# Patient Record
Sex: Male | Born: 1960 | Race: White | Hispanic: No | Marital: Single | Smoking: Never smoker
Health system: Southern US, Community
[De-identification: ages and names within clinical notes are randomized; demographics above are authoritative.]

## PROBLEM LIST (undated history)

## (undated) DIAGNOSIS — E119 Type 2 diabetes mellitus without complications: Secondary | ICD-10-CM

## (undated) DIAGNOSIS — E785 Hyperlipidemia, unspecified: Secondary | ICD-10-CM

## (undated) DIAGNOSIS — I10 Essential (primary) hypertension: Secondary | ICD-10-CM

## (undated) HISTORY — PX: TONSILLECTOMY AND ADENOIDECTOMY: SUR1326

## (undated) HISTORY — DX: Type 2 diabetes mellitus without complications: E11.9

## (undated) HISTORY — DX: Essential (primary) hypertension: I10

## (undated) HISTORY — DX: Hyperlipidemia, unspecified: E78.5

## (undated) HISTORY — PX: CATARACT EXTRACTION: SUR2

## (undated) HISTORY — PX: HERNIA REPAIR: SHX51

---

## 2015-03-07 LAB — HM DIABETES EYE EXAM

## 2015-03-30 ENCOUNTER — Ambulatory Visit (INDEPENDENT_AMBULATORY_CARE_PROVIDER_SITE_OTHER): Payer: Self-pay | Admitting: Family Medicine

## 2015-03-30 ENCOUNTER — Encounter: Payer: Self-pay | Admitting: Family Medicine

## 2015-03-30 VITALS — BP 112/59 | HR 59 | Temp 98.8°F | Ht 69.5 in | Wt 175.0 lb

## 2015-03-30 DIAGNOSIS — E1151 Type 2 diabetes mellitus with diabetic peripheral angiopathy without gangrene: Secondary | ICD-10-CM | POA: Insufficient documentation

## 2015-03-30 DIAGNOSIS — F419 Anxiety disorder, unspecified: Secondary | ICD-10-CM | POA: Insufficient documentation

## 2015-03-30 DIAGNOSIS — D509 Iron deficiency anemia, unspecified: Secondary | ICD-10-CM

## 2015-03-30 DIAGNOSIS — E785 Hyperlipidemia, unspecified: Secondary | ICD-10-CM | POA: Insufficient documentation

## 2015-03-30 DIAGNOSIS — E119 Type 2 diabetes mellitus without complications: Secondary | ICD-10-CM

## 2015-03-30 DIAGNOSIS — I10 Essential (primary) hypertension: Secondary | ICD-10-CM | POA: Insufficient documentation

## 2015-03-30 LAB — BAYER DCA HB A1C WAIVED: HB A1C (BAYER DCA - WAIVED): 6.3 % (ref <7.0)

## 2015-03-30 MED ORDER — BENAZEPRIL HCL 40 MG PO TABS: 40.0000 mg | ORAL_TABLET | Freq: Every day | ORAL | Status: DC

## 2015-03-30 MED ORDER — SIMVASTATIN 40 MG PO TABS: 40.0000 mg | ORAL_TABLET | Freq: Every day | ORAL | Status: DC

## 2015-03-30 MED ORDER — METFORMIN HCL 500 MG PO TABS: 1000.0000 mg | ORAL_TABLET | Freq: Two times a day (BID) | ORAL | Status: DC

## 2015-03-30 MED ORDER — GLYBURIDE 5 MG PO TABS: 5.0000 mg | ORAL_TABLET | Freq: Every day | ORAL | Status: DC

## 2015-03-30 NOTE — Assessment & Plan Note (Signed)
The current medical regimen is effective;  continue present plan and medications.  

## 2015-03-30 NOTE — Progress Notes (Signed)
BP 112/59 mmHg  Pulse 59  Temp(Src) 98.8 F (37.1 C)  Ht 5' 9.5" (1.765 m)  Wt 175 lb (79.379 kg)  BMI 25.48 kg/m2  SpO2 99%   Subjective:    Patient ID: Stanley Dickerson, male    DOB: 12-01-1960, 54 y.o.   MRN: [id number]  HPI: Stanley Dickerson is a 54 y.o. male  Chief Complaint  Patient presents with  . Diabetes  . Allergic Rhinitis   . Urticaria  . Insect Bite    multiple tick bites  meds doing well glu stable No low spells  BP and lipids stable  Allergies has not tried any meds Had hives took benadril helped Had a lot of stress  Tick bites no rash  Etc no flu sx  Relevant past medical, surgical, family and social history reviewed and updated as indicated. Interim medical history since our last visit reviewed. Allergies and medications reviewed and updated.  Review of Systems  Constitutional: Negative.   Respiratory: Negative.   Cardiovascular: Negative.     Per HPI unless specifically indicated above     Objective:    BP 112/59 mmHg  Pulse 59  Temp(Src) 98.8 F (37.1 C)  Ht 5' 9.5" (1.765 m)  Wt 175 lb (79.379 kg)  BMI 25.48 kg/m2  SpO2 99%  Wt Readings from Last 3 Encounters:  03/30/15 175 lb (79.379 kg)  12/01/14 178 lb (80.74 kg)    Physical Exam  Constitutional: He is oriented to person, place, and time. He appears well-developed and well-nourished. No distress.  HENT:  Head: Normocephalic and atraumatic.  Right Ear: Hearing normal.  Left Ear: Hearing normal.  Nose: Nose normal.  Eyes: Conjunctivae and lids are normal. Right eye exhibits no discharge. Left eye exhibits no discharge. No scleral icterus.  Cardiovascular: Normal rate, regular rhythm and normal heart sounds.   Pulmonary/Chest: Effort normal and breath sounds normal. No respiratory distress.  Musculoskeletal: Normal range of motion.  Neurological: He is alert and oriented to person, place, and time.  Skin: Skin is intact. No rash noted.  Psychiatric: He has a normal mood  and affect. His speech is normal and behavior is normal. Judgment and thought content normal. Cognition and memory are normal.    No results found for this or any previous visit.    Assessment & Plan:   Problem List Items Addressed This Visit      Cardiovascular and Mediastinum   Hypertension (Chronic)    The current medical regimen is effective;  continue present plan and medications.       Relevant Medications   benazepril (LOTENSIN) 40 MG tablet   simvastatin (ZOCOR) 40 MG tablet     Endocrine   Diabetes mellitus without complication - Primary (Chronic)    The current medical regimen is effective;  continue present plan and medications.       Relevant Medications   benazepril (LOTENSIN) 40 MG tablet   glyBURIDE (DIABETA) 5 MG tablet   metFORMIN (GLUCOPHAGE) 500 MG tablet   simvastatin (ZOCOR) 40 MG tablet   Other Relevant Orders   Bayer DCA Hb A1c Waived   Hemoglobin A1c     Other   Hyperlipidemia (Chronic)    The current medical regimen is effective;  continue present plan and medications.       Relevant Medications   benazepril (LOTENSIN) 40 MG tablet   simvastatin (ZOCOR) 40 MG tablet   Anxiety (Chronic)    Other Visit Diagnoses    Anemia, iron deficiency  Relevant Orders    CBC with Differential/Platelet        Follow up plan: Return in about 3 months (around 06/30/2015) for a1c.

## 2015-04-09 ENCOUNTER — Emergency Department: Payer: Self-pay

## 2015-04-09 ENCOUNTER — Other Ambulatory Visit: Payer: Self-pay

## 2015-04-09 ENCOUNTER — Emergency Department
Admission: EM | Admit: 2015-04-09 | Discharge: 2015-04-09 | Disposition: A | Discharge status: Home / Self Care | Payer: Self-pay | Attending: Emergency Medicine | Admitting: Emergency Medicine

## 2015-04-09 ENCOUNTER — Encounter: Payer: Self-pay | Admitting: Emergency Medicine

## 2015-04-09 DIAGNOSIS — E119 Type 2 diabetes mellitus without complications: Secondary | ICD-10-CM | POA: Insufficient documentation

## 2015-04-09 DIAGNOSIS — Z79899 Other long term (current) drug therapy: Secondary | ICD-10-CM | POA: Insufficient documentation

## 2015-04-09 DIAGNOSIS — Y9389 Activity, other specified: Secondary | ICD-10-CM | POA: Insufficient documentation

## 2015-04-09 DIAGNOSIS — J069 Acute upper respiratory infection, unspecified: Secondary | ICD-10-CM | POA: Insufficient documentation

## 2015-04-09 DIAGNOSIS — W57XXXA Bitten or stung by nonvenomous insect and other nonvenomous arthropods, initial encounter: Secondary | ICD-10-CM | POA: Insufficient documentation

## 2015-04-09 DIAGNOSIS — Y998 Other external cause status: Secondary | ICD-10-CM | POA: Insufficient documentation

## 2015-04-09 DIAGNOSIS — I10 Essential (primary) hypertension: Secondary | ICD-10-CM | POA: Insufficient documentation

## 2015-04-09 DIAGNOSIS — Y9289 Other specified places as the place of occurrence of the external cause: Secondary | ICD-10-CM | POA: Insufficient documentation

## 2015-04-09 DIAGNOSIS — R509 Fever, unspecified: Secondary | ICD-10-CM

## 2015-04-09 DIAGNOSIS — R5081 Fever presenting with conditions classified elsewhere: Secondary | ICD-10-CM | POA: Insufficient documentation

## 2015-04-09 DIAGNOSIS — T148 Other injury of unspecified body region: Secondary | ICD-10-CM | POA: Insufficient documentation

## 2015-04-09 LAB — CBC WITH DIFFERENTIAL/PLATELET
BASOS PCT: 2 %
Basophils Absolute: 0 10*3/uL (ref 0–0.1)
EOS PCT: 0 %
Eosinophils Absolute: 0 10*3/uL (ref 0–0.7)
HEMATOCRIT: 34.4 % — AB (ref 40.0–52.0)
Hemoglobin: 11.3 g/dL — ABNORMAL LOW (ref 13.0–18.0)
LYMPHS PCT: 17 %
Lymphs Abs: 0.4 10*3/uL — ABNORMAL LOW (ref 1.0–3.6)
MCH: 26.6 pg (ref 26.0–34.0)
MCHC: 32.8 g/dL (ref 32.0–36.0)
MCV: 81.1 fL (ref 80.0–100.0)
MONO ABS: 0.2 10*3/uL (ref 0.2–1.0)
MONOS PCT: 8 %
Neutro Abs: 1.9 10*3/uL (ref 1.4–6.5)
Neutrophils Relative %: 73 %
Platelets: 151 10*3/uL (ref 150–440)
RBC: 4.24 MIL/uL — AB (ref 4.40–5.90)
RDW: 16.6 % — AB (ref 11.5–14.5)
WBC: 2.6 10*3/uL — AB (ref 3.8–10.6)

## 2015-04-09 LAB — COMPREHENSIVE METABOLIC PANEL
ALT: 35 U/L (ref 17–63)
AST: 39 U/L (ref 15–41)
Albumin: 4.1 g/dL (ref 3.5–5.0)
Alkaline Phosphatase: 45 U/L (ref 38–126)
Anion gap: 10 (ref 5–15)
BILIRUBIN TOTAL: 0.7 mg/dL (ref 0.3–1.2)
BUN: 26 mg/dL — AB (ref 6–20)
CO2: 26 mmol/L (ref 22–32)
CREATININE: 1.12 mg/dL (ref 0.61–1.24)
Calcium: 9.3 mg/dL (ref 8.9–10.3)
Chloride: 95 mmol/L — ABNORMAL LOW (ref 101–111)
GFR calc Af Amer: >60 mL/min (ref >60)
GLUCOSE: 153 mg/dL — AB (ref 65–99)
Potassium: 3.9 mmol/L (ref 3.5–5.1)
SODIUM: 131 mmol/L — AB (ref 135–145)
TOTAL PROTEIN: 7.7 g/dL (ref 6.5–8.1)

## 2015-04-09 LAB — TROPONIN I: Troponin I: <0.03 ng/mL (ref <0.031)

## 2015-04-09 MED ORDER — DOXYCYCLINE HYCLATE 50 MG PO CAPS: 100.0000 mg | ORAL_CAPSULE | Freq: Two times a day (BID) | ORAL | Status: AC

## 2015-04-09 MED ORDER — DOXYCYCLINE HYCLATE 100 MG PO TABS
ORAL_TABLET | ORAL | Status: AC
  Administered 2015-04-09: 100 mg via ORAL
  Filled 2015-04-09: qty 1

## 2015-04-09 MED ORDER — SODIUM CHLORIDE 0.9 % IV BOLUS (SEPSIS)
1000.0000 mL | Freq: Once | INTRAVENOUS | Status: AC
  Administered 2015-04-09: 1000 mL via INTRAVENOUS

## 2015-04-09 MED ORDER — DOXYCYCLINE HYCLATE 100 MG PO TABS
100.0000 mg | ORAL_TABLET | Freq: Once | ORAL | Status: AC
  Administered 2015-04-09: 100 mg via ORAL

## 2015-04-09 MED ORDER — KETOROLAC TROMETHAMINE 30 MG/ML IJ SOLN
INTRAMUSCULAR | Status: AC
  Administered 2015-04-09: 30 mg via INTRAVENOUS
  Filled 2015-04-09: qty 1

## 2015-04-09 MED ORDER — KETOROLAC TROMETHAMINE 30 MG/ML IJ SOLN
30.0000 mg | Freq: Once | INTRAMUSCULAR | Status: AC
  Administered 2015-04-09: 30 mg via INTRAVENOUS

## 2015-04-09 NOTE — ED Provider Notes (Signed)
[facility] Emergency Department Provider Note  ____________________________________________  Time seen: 1502  I have reviewed the triage vital signs and the nursing notes.   HISTORY  Chief Complaint Fever and Cough Diarrhea Fatigue   HPI Stanley Dickerson is a 54 y.o. male with diabetes who reports that he has been having fevers since Tuesday or Wednesday with a cough. He has had some nasal congestion as well.  His fever on Thursday reached 102.5. Tylenol with relief. The fever came back today 201.5.  He reports he has been feeling more fatigued recently. 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  He started have some loose stool yesterday. Today he began to have watery diarrhea.  While he was driving today he had some difficulty recognizing the colors of the street light. He felt a little weak. He parked his car and had his girlfriend drive him to the doctor's office next door, [place] [facility]. They were concerned because of the vision issues and sent him to the emergency department.  Patient does report that he has "sinus infections" 3 or 4 times per year.  Patient reports he has had multiple tick bites in the past few weeks. He discussed this with his primary physician, Dr. Dossie Arbour, but they agreed that antibiotics were not indicated.    Past Medical History  Diagnosis Date  . Anxiety   . Hypertension   . Diabetes mellitus without complication   . Hyperlipidemia     Patient Active Problem List   Diagnosis Date Noted  . Hypertension 03/30/2015  . Diabetes mellitus without complication 03/30/2015  . Hyperlipidemia 03/30/2015  . Anxiety 03/30/2015    Past Surgical History  Procedure Laterality Date  . Hernia repair    . Tonsillectomy and adenoidectomy      Current Outpatient Rx  Name  Route  Sig  Dispense  Refill  . aspirin  EC 81 MG tablet   Oral   Take 81 mg by mouth daily.         . benazepril (LOTENSIN) 40 MG tablet   Oral   Take 1 tablet (40 mg total) by mouth daily.   90 tablet   4   . doxycycline (VIBRAMYCIN) 50 MG capsule   Oral   Take 2 capsules (100 mg total) by mouth 2 (two) times daily.   40 capsule   0   . glyBURIDE (DIABETA) 5 MG tablet   Oral   Take 1 tablet (5 mg total) by mouth daily with breakfast.   90 tablet   4   . metFORMIN (GLUCOPHAGE) 500 MG tablet   Oral   Take 2 tablets (1,000 mg total) by mouth 2 (two) times daily with a meal.   90 tablet   4   . simvastatin (ZOCOR) 40 MG tablet   Oral   Take 1 tablet (40 mg total) by mouth daily.   90 tablet   4     Allergies Review of patient's allergies indicates no known allergies.  Family History  Problem Relation Age of Onset  . Depression Mother   . Thyroid disease Mother   . Alzheimer's disease Mother   . Alcohol abuse Father   . Cancer Father     throat and lung  . Alzheimer's disease Maternal Grandmother     Social History History  Substance Use Topics  . Smoking status: Never  Smoker   . Smokeless tobacco: Never Used  . Alcohol Use: No    Review of Systems  Constitutional: Positive for fever. ENT: Positive for sore throat and congestion.. Cardiovascular: Negative for chest pain. Respiratory: Positive for cough. Gastrointestinal: No abdominal pain but watery diarrhea today.. Genitourinary: Negative for dysuria. Musculoskeletal: No myalgias or injuries. Skin: Negative for rash. There are some scabbed over areas from 2 bites. Neurological: Negative for headaches   10-point ROS otherwise negative.  ____________________________________________   PHYSICAL EXAM:  VITAL SIGNS: ED Triage Vitals  Enc Vitals Group     BP 04/09/15 1323 95/66 mmHg     Pulse Rate 04/09/15 1323 99     Resp 04/09/15 1323 18     Temp 04/09/15 1323 100.1 F (37.8 C)     Temp Source 04/09/15 1323 Oral     SpO2  04/09/15 1323 98 %     Weight 04/09/15 1323 168 lb (76.204 kg)     Height 04/09/15 1323 5\' 11"  (1.803 m)     Head Cir --      Peak Flow --      Pain Score 04/09/15 1324 4     Pain Loc --      Pain Edu? --      Excl. in GC? --     Constitutional: Alert and oriented. Well appearing and in no distress. ENT   Head: Normocephalic and atraumatic.   Nose: No congestion/rhinnorhea.   Mouth/Throat: Mucous membranes are moist. Cardiovascular: Normal rate, regular rhythm, no murmur noted Respiratory:  Normal respiratory effort, no tachypnea.    Breath sounds are clear and equal bilaterally.  Gastrointestinal: Soft and nontender. No distention.  Back: No muscle spasm, no tenderness, no CVA tenderness. Musculoskeletal: No deformity noted. Nontender with normal range of motion in all extremities.  No noted edema. Neurologic:  Normal speech and language. No gross focal neurologic deficits are appreciated.  Skin:  Skin is warm, dry. Tick bites appear well-healed without any erythema. No rash. Psychiatric: Mood and affect are normal. Speech and behavior are normal.  ____________________________________________    LABS (pertinent positives/negatives)  CBC shows a low white blood cell count of 2.6. Hemoglobin 11.3. Sodium 131, BUN 26, glucose 153, otherwise metabolic panel is fairly reasonable.  Troponin is negative  ____________________________________________   EKG  ED ECG REPORT I, Vivia Rosenburg W, the attending physician, personally viewed and interpreted this ECG.   Date: 04/09/2015  EKG Time: 1529  Rate: 90  Rhythm: Normal sinus rhythm with possible left ventricular hypertrophy  Axis: Normal  Intervals: Normal  ST&T Change: T wave appears flat down in lead 3. There are some color T waves in the precordial leads.  ____________________________________________    RADIOLOGY  Chest x-ray: IMPRESSION: No active cardiopulmonary  disease.  ____________________________________________  ____________________________________________   INITIAL IMPRESSION / ASSESSMENT AND PLAN / ED COURSE  Pertinent labs & imaging results that were available during my care of the patient were reviewed by me and considered in my medical decision making (see chart for details).  Patient with multiple symptoms including cough fever recent congestion diarrhea today and concerns for tick bite. We have discussed at length the diagnosis of sinusitis versus rhinitis versus bronchitis. We've discussed his risk for a tickborne illness. We have agreed to use doxycycline. We are treating him with a liter of IV fluid. We've noted his sodium was little bit low and his BUN was slightly high.  Reassessment at 1700: Patient feels well. He does have a  fever that has come back up. We'll treat him with Toradol and let the risks of the IV fluids going in. We will write a perception for doxycycline. The low white blood cell count may be due to tickborne illness.  ____________________________________________   FINAL CLINICAL IMPRESSION(S) / ED DIAGNOSES  Final diagnoses:  Other specified fever  Upper respiratory tract infection  Tick bite      Darien Ramus, MD 04/09/15 1721

## 2015-04-09 NOTE — Discharge Instructions (Signed)
Take doxycycline for 10 days. Follow-up with your regular doctor later this week. Return to the emergency department if you have worsening symptoms or urgent concerns.

## 2015-04-09 NOTE — ED Notes (Addendum)
C/o cough, sore throat, congestion and fever x 1 week, states he vomited x 1 today and while driving he had some blurred vision

## 2015-07-05 ENCOUNTER — Ambulatory Visit: Payer: Self-pay | Admitting: Family Medicine

## 2015-08-08 ENCOUNTER — Telehealth: Payer: Self-pay

## 2015-08-08 DIAGNOSIS — E119 Type 2 diabetes mellitus without complications: Secondary | ICD-10-CM

## 2015-08-08 NOTE — Telephone Encounter (Signed)
Pharmacy requests a resend on metformin 500 mg tab. Pharmacy noted that the Rx is not a 30 day supply dispensed 90.   [facility] [address] [place]  [phone]

## 2015-08-09 MED ORDER — METFORMIN HCL 500 MG PO TABS: 1000.0000 mg | ORAL_TABLET | Freq: Two times a day (BID) | ORAL | Status: DC

## 2015-09-05 ENCOUNTER — Encounter: Payer: Self-pay | Admitting: Family Medicine

## 2015-10-20 ENCOUNTER — Ambulatory Visit (INDEPENDENT_AMBULATORY_CARE_PROVIDER_SITE_OTHER): Payer: Self-pay | Admitting: Family Medicine

## 2015-10-20 ENCOUNTER — Encounter: Payer: Self-pay | Admitting: Family Medicine

## 2015-10-20 VITALS — BP 123/75 | HR 76 | Temp 98.0°F | Ht 68.7 in | Wt 176.0 lb

## 2015-10-20 DIAGNOSIS — I10 Essential (primary) hypertension: Secondary | ICD-10-CM

## 2015-10-20 DIAGNOSIS — R42 Dizziness and giddiness: Secondary | ICD-10-CM

## 2015-10-20 DIAGNOSIS — E119 Type 2 diabetes mellitus without complications: Secondary | ICD-10-CM

## 2015-10-20 LAB — BAYER DCA HB A1C WAIVED: HB A1C: 6.6 % (ref <7.0)

## 2015-10-20 NOTE — Assessment & Plan Note (Signed)
Patient's diabetes has been doing well has not been checking home glucose discussed to check his blood sugar if he can during one of these spells are right after. That is if he has any more

## 2015-10-20 NOTE — Assessment & Plan Note (Signed)
The current medical regimen is effective;  continue present plan and medications.  

## 2015-10-20 NOTE — Assessment & Plan Note (Signed)
Discussed vertigo care and treatment cautions about falling and driving patient has further spells will need to evaluate patient with no positional changes

## 2015-10-20 NOTE — Progress Notes (Signed)
BP 123/75 mmHg  Pulse 76  Temp(Src) 98 F (36.7 C)  Ht 5' 8.7" (1.745 m)  Wt 176 lb (79.833 kg)  BMI 26.22 kg/m2  SpO2 99%   Subjective:    Patient ID: Stanley Dickerson, male    DOB: 08/24/1961, 55 y.o.   MRN: [id number]  HPI: Stanley Dickerson is a 55 y.o. male  Chief Complaint  Patient presents with  . Diabetes  . sinus problems  . Nausea   patient's diabetes doing well no complaints noted low blood sugar spells taking medications faithfully without problems  Patient did have a terrible vertigo spell last week with taking garbage to the dumpster got so dizzy that he could not stand up and had to crawl back into the office building. The spell lasted about 15 minutes then completely resolved. Patient noticed no palpitations and no diaphoresis no other low blood sugar spells but he did not check his blood sugar. Patient with no chest pain with exertion and had no nausea or vomiting.  Relevant past medical, surgical, family and social history reviewed and updated as indicated. Interim medical history since our last visit reviewed. Allergies and medications reviewed and updated.  Review of Systems  Constitutional: Negative.   Respiratory: Negative.   Cardiovascular: Negative.     Per HPI unless specifically indicated above     Objective:    BP 123/75 mmHg  Pulse 76  Temp(Src) 98 F (36.7 C)  Ht 5' 8.7" (1.745 m)  Wt 176 lb (79.833 kg)  BMI 26.22 kg/m2  SpO2 99%  Wt Readings from Last 3 Encounters:  10/20/15 176 lb (79.833 kg)  04/09/15 168 lb (76.204 kg)  03/30/15 175 lb (79.379 kg)    Physical Exam  Constitutional: He is oriented to person, place, and time. He appears well-developed and well-nourished. No distress.  HENT:  Head: Normocephalic and atraumatic.  Right Ear: Hearing normal.  Left Ear: Hearing and external ear normal.  Nose: Nose normal.  Mouth/Throat: Oropharynx is clear and moist. No oropharyngeal exudate.  Eyes: Conjunctivae, EOM and lids are  normal. Right eye exhibits no discharge. Left eye exhibits no discharge. No scleral icterus.  Neck: Normal range of motion. Neck supple. No thyromegaly present.  Cardiovascular: Normal rate, regular rhythm and normal heart sounds.   Pulmonary/Chest: Effort normal and breath sounds normal. No respiratory distress.  Abdominal: He exhibits no distension. There is no tenderness.  Musculoskeletal: Normal range of motion.  Lymphadenopathy:    He has no cervical adenopathy.  Neurological: He is alert and oriented to person, place, and time.  Skin: Skin is intact. No rash noted.  Psychiatric: He has a normal mood and affect. His speech is normal and behavior is normal. Judgment and thought content normal. Cognition and memory are normal.    Results for orders placed or performed during the hospital encounter of 04/09/15  CBC with Differential  Result Value Ref Range   WBC 2.6 (L) 3.8 - 10.6 K/uL   RBC 4.24 (L) 4.40 - 5.90 MIL/uL   Hemoglobin 11.3 (L) 13.0 - 18.0 g/dL   HCT 13.2 (L) 44.0 - 10.2 %   MCV 81.1 80.0 - 100.0 fL   MCH 26.6 26.0 - 34.0 pg   MCHC 32.8 32.0 - 36.0 g/dL   RDW 72.5 (H) 36.6 - 44.0 %   Platelets 151 150 - 440 K/uL   Neutrophils Relative % 73 %   Neutro Abs 1.9 1.4 - 6.5 K/uL   Lymphocytes Relative 17 %  Lymphs Abs 0.4 (L) 1.0 - 3.6 K/uL   Monocytes Relative 8 %   Monocytes Absolute 0.2 0.2 - 1.0 K/uL   Eosinophils Relative 0 %   Eosinophils Absolute 0.0 0 - 0.7 K/uL   Basophils Relative 2 %   Basophils Absolute 0.0 0 - 0.1 K/uL  Comprehensive metabolic panel  Result Value Ref Range   Sodium 131 (L) 135 - 145 mmol/L   Potassium 3.9 3.5 - 5.1 mmol/L   Chloride 95 (L) 101 - 111 mmol/L   CO2 26 22 - 32 mmol/L   Glucose, Bld 153 (H) 65 - 99 mg/dL   BUN 26 (H) 6 - 20 mg/dL   Creatinine, Ser 4.781.12 0.61 - 1.24 mg/dL   Calcium 9.3 8.9 - 29.510.3 mg/dL   Total Protein 7.7 6.5 - 8.1 g/dL   Albumin 4.1 3.5 - 5.0 g/dL   AST 39 15 - 41 U/L   ALT 35 17 - 63 U/L   Alkaline  Phosphatase 45 38 - 126 U/L   Total Bilirubin 0.7 0.3 - 1.2 mg/dL   GFR calc non Af Amer >60 >60 mL/min   GFR calc Af Amer >60 >60 mL/min   Anion gap 10 5 - 15  Troponin I  Result Value Ref Range   Troponin I <0.03 <0.031 ng/mL      Assessment & Plan:   Problem List Items Addressed This Visit      Cardiovascular and Mediastinum   Hypertension (Chronic)    The current medical regimen is effective;  continue present plan and medications.         Endocrine   Diabetes mellitus without complication (HCC) - Primary (Chronic)    Patient's diabetes has been doing well has not been checking home glucose discussed to check his blood sugar if he can during one of these spells are right after. That is if he has any more      Relevant Orders   Bayer DCA Hb A1c Waived   Flu Vaccine QUAD 36+ mos PF IM (Fluarix & Fluzone Quad PF)     Other   Vertigo    Discussed vertigo care and treatment cautions about falling and driving patient has further spells will need to evaluate patient with no positional changes      Relevant Orders   EKG 12-Lead (Completed)     reviewed EKG normal sinus rhythm no palpitations question of LVH  Follow up plan: Return in about 3 months (around 01/18/2016), or if symptoms worsen or fail to improve, for Physical Exam.

## 2015-12-20 ENCOUNTER — Encounter: Payer: Self-pay | Admitting: Family Medicine

## 2016-01-31 ENCOUNTER — Encounter: Payer: Self-pay | Admitting: Family Medicine

## 2016-01-31 ENCOUNTER — Ambulatory Visit (INDEPENDENT_AMBULATORY_CARE_PROVIDER_SITE_OTHER): Payer: Self-pay | Admitting: Family Medicine

## 2016-01-31 VITALS — BP 117/59 | HR 76 | Temp 98.1°F | Ht 68.2 in | Wt 174.0 lb

## 2016-01-31 DIAGNOSIS — E785 Hyperlipidemia, unspecified: Secondary | ICD-10-CM

## 2016-01-31 DIAGNOSIS — E119 Type 2 diabetes mellitus without complications: Secondary | ICD-10-CM

## 2016-01-31 DIAGNOSIS — I10 Essential (primary) hypertension: Secondary | ICD-10-CM

## 2016-01-31 DIAGNOSIS — Z1211 Encounter for screening for malignant neoplasm of colon: Secondary | ICD-10-CM

## 2016-01-31 LAB — BAYER DCA HB A1C WAIVED: HB A1C: 7 % — AB (ref <7.0)

## 2016-01-31 MED ORDER — GLYBURIDE 5 MG PO TABS: 5.0000 mg | ORAL_TABLET | Freq: Every day | ORAL | Status: DC

## 2016-01-31 MED ORDER — BENAZEPRIL HCL 40 MG PO TABS: 40.0000 mg | ORAL_TABLET | Freq: Every day | ORAL | Status: DC

## 2016-01-31 MED ORDER — METFORMIN HCL 500 MG PO TABS: 1000.0000 mg | ORAL_TABLET | Freq: Two times a day (BID) | ORAL | Status: DC

## 2016-01-31 MED ORDER — SIMVASTATIN 40 MG PO TABS: 40.0000 mg | ORAL_TABLET | Freq: Every day | ORAL | Status: DC

## 2016-01-31 NOTE — Progress Notes (Signed)
BP 117/59 mmHg  Pulse 76  Temp(Src) 98.1 F (36.7 C)  Ht 5' 8.2" (1.732 m)  Wt 174 lb (78.926 kg)  BMI 26.31 kg/m2  SpO2 97%   Subjective:    Patient ID: Stanley Dickerson, male    DOB: [birth year], 55 y.o.   MRN: [id number]  HPI: Stanley Dickerson is a 55 y.o. male  Chief Complaint  Patient presents with  . Annual Exam  Patient for medical exam doing well taking Benzapril with no issues good blood pressure control. Diabetes noted low blood sugar spells no issues with medicines takes faithfully Takes simvastatin with no issues takes faithfully Has some concerns about family history and Alzheimer's reviewed  Relevant past medical, surgical, family and social history reviewed and updated as indicated. Interim medical history since our last visit reviewed. Allergies and medications reviewed and updated.  Review of Systems  Constitutional: Negative.   HENT: Negative.   Eyes: Negative.   Respiratory: Negative.   Cardiovascular: Negative.   Gastrointestinal: Negative.   Endocrine: Negative.   Genitourinary: Negative.   Musculoskeletal: Negative.   Skin: Negative.   Allergic/Immunologic: Negative.   Neurological: Negative.   Hematological: Negative.   Psychiatric/Behavioral: Negative.     Per HPI unless specifically indicated above     Objective:    BP 117/59 mmHg  Pulse 76  Temp(Src) 98.1 F (36.7 C)  Ht 5' 8.2" (1.732 m)  Wt 174 lb (78.926 kg)  BMI 26.31 kg/m2  SpO2 97%  Wt Readings from Last 3 Encounters:  01/31/16 174 lb (78.926 kg)  10/20/15 176 lb (79.833 kg)  04/09/15 168 lb (76.204 kg)    Physical Exam  Constitutional: He is oriented to person, place, and time. He appears well-developed and well-nourished.  HENT:  Head: Normocephalic and atraumatic.  Right Ear: External ear normal.  Left Ear: External ear normal.  Eyes: Conjunctivae and EOM are normal. Pupils are equal, round, and reactive to light.  Neck: Normal range of motion. Neck supple.   Cardiovascular: Normal rate, regular rhythm, normal heart sounds and intact distal pulses.   Pulmonary/Chest: Effort normal and breath sounds normal.  Abdominal: Soft. Bowel sounds are normal. There is no splenomegaly or hepatomegaly.  Genitourinary: Rectum normal, prostate normal and penis normal.  Musculoskeletal: Normal range of motion.  Neurological: He is alert and oriented to person, place, and time. He has normal reflexes.  Skin: No rash noted. No erythema.  Psychiatric: He has a normal mood and affect. His behavior is normal. Judgment and thought content normal.    Results for orders placed or performed in visit on 10/20/15  Bayer DCA Hb A1c Waived  Result Value Ref Range   Bayer DCA Hb A1c Waived 6.6 <7.0 %      Assessment & Plan:   Problem List Items Addressed This Visit      Cardiovascular and Mediastinum   Hypertension (Chronic)    The current medical regimen is effective;  continue present plan and medications.       Relevant Medications   benazepril (LOTENSIN) 40 MG tablet   simvastatin (ZOCOR) 40 MG tablet   Other Relevant Orders   Comprehensive metabolic panel   Lipid panel   CBC with Differential/Platelet   TSH   Urinalysis, Routine w reflex microscopic (not at [facility])   PSA   Bayer DCA Hb A1c Waived     Endocrine   Diabetes mellitus without complication (HCC) (Chronic)   Relevant Medications   benazepril (LOTENSIN) 40 MG tablet  glyBURIDE (DIABETA) 5 MG tablet   metFORMIN (GLUCOPHAGE) 500 MG tablet   simvastatin (ZOCOR) 40 MG tablet   Other Relevant Orders   Comprehensive metabolic panel   Lipid panel   CBC with Differential/Platelet   TSH   Urinalysis, Routine w reflex microscopic ([facility])   PSA   Bayer DCA Hb A1c Waived     Other   Hyperlipidemia (Chronic)    The current medical regimen is effective;  continue present plan and medications.       Relevant Medications   benazepril (LOTENSIN) 40 MG tablet   simvastatin (ZOCOR) 40 MG  tablet   Other Relevant Orders   Comprehensive metabolic panel   Lipid panel   CBC with Differential/Platelet   TSH   Urinalysis, Routine w reflex microscopic [facility])   PSA   Bayer DCA Hb A1c Waived    Other Visit Diagnoses    Colon cancer screening    -  Primary    Relevant Orders    Ambulatory referral to General Surgery    Comprehensive metabolic panel    Lipid panel    CBC with Differential/Platelet    TSH    Urinalysis, Routine w reflex microscopic ([facility])    PSA    Bayer DCA Hb A1c Waived        Follow up plan: Return in about 3 months (around 05/01/2016).

## 2016-01-31 NOTE — Assessment & Plan Note (Signed)
The current medical regimen is effective;  continue present plan and medications.  

## 2016-04-02 LAB — HM DIABETES EYE EXAM

## 2016-04-30 ENCOUNTER — Other Ambulatory Visit: Payer: Self-pay

## 2016-04-30 ENCOUNTER — Telehealth: Payer: Self-pay

## 2016-04-30 NOTE — Telephone Encounter (Signed)
Gastroenterology Pre-Procedure Review  Request Date: 06/08/2016  Requesting Physician: Dr. Dossie Arbourrissman  PATIENT REVIEW QUESTIONS: The patient responded to the following health history questions as indicated:    1. Are you having any GI issues? no 2. Do you have a personal history of Polyps? yes (benign) 3. Do you have a family history of Colon Cancer or Polyps? no 4. Diabetes Mellitus? yes (Type 2) 5. Joint replacements in the past 12 months?no 6. Major health problems in the past 3 months?no 7. Any artificial heart valves, MVP, or defibrillator?no    MEDICATIONS & ALLERGIES:    Patient reports the following regarding taking any anticoagulation/antiplatelet therapy:   Plavix, Coumadin, Eliquis, Xarelto, Lovenox, Pradaxa, Brilinta, or Effient? no Aspirin? yes (Diabetis )  Patient confirms/reports the following medications:  Current Outpatient Prescriptions  Medication Sig Dispense Refill  . aspirin EC 81 MG tablet Take 81 mg by mouth daily.    . benazepril (LOTENSIN) 40 MG tablet Take 1 tablet (40 mg total) by mouth daily. 90 tablet 4  . glyBURIDE (DIABETA) 5 MG tablet Take 1 tablet (5 mg total) by mouth daily with breakfast. 90 tablet 4  . metFORMIN (GLUCOPHAGE) 500 MG tablet Take 2 tablets (1,000 mg total) by mouth 2 (two) times daily with a meal. 360 tablet 4  . simvastatin (ZOCOR) 40 MG tablet Take 1 tablet (40 mg total) by mouth daily. 90 tablet 4  . B-12, METHYLCOBALAMIN, SL Place under the tongue 2 (two) times a week. Reported on 04/30/2016     No current facility-administered medications for this visit.    Patient confirms/reports the following allergies:  No Known Allergies  No orders of the defined types were placed in this encounter.    AUTHORIZATION INFORMATION Primary Insurance: 1D#: Group #:  Secondary Insurance: 1D#: Group #:  SCHEDULE INFORMATION: Date: 06/08/2016 Time: Location: [facility]

## 2016-05-15 ENCOUNTER — Ambulatory Visit: Payer: Self-pay | Admitting: Family Medicine

## 2016-06-04 ENCOUNTER — Telehealth: Payer: Self-pay

## 2016-06-04 NOTE — Telephone Encounter (Signed)
Patient called stating that he needed to reschedule his Colonoscopy scheduled on 06/08/2016. Patient also stated that he was suppose to receive a call with how much it would cost him to have this procedure done since he is a self-pay patient.

## 2016-06-05 NOTE — Telephone Encounter (Signed)
I'm keeping his appointment on the books until the hospital calls for his post op to give him some information as far as what his colonoscopy will cost him since he is self pay. I also gave him the number to billing [phone]. I told him that I am not sure if they will be able to help but it's worth a shot. He will call me back as soon as he finds out to reschedule

## 2016-06-07 NOTE — Telephone Encounter (Signed)
Patient canceled his colonoscopy through the surgery center.

## 2016-06-08 ENCOUNTER — Encounter: Admission: RE | Payer: Self-pay | Source: Ambulatory Visit

## 2016-06-08 ENCOUNTER — Ambulatory Visit: Admission: RE | Admit: 2016-06-08 | Payer: Self-pay | Source: Ambulatory Visit | Admitting: Gastroenterology

## 2016-06-08 SURGERY — COLONOSCOPY WITH PROPOFOL
Anesthesia: General

## 2016-06-19 NOTE — Telephone Encounter (Signed)
It looks like patient cancelled his colonoscopy through [facility].

## 2016-08-01 ENCOUNTER — Encounter: Payer: Self-pay | Admitting: Family Medicine

## 2016-08-01 ENCOUNTER — Ambulatory Visit (INDEPENDENT_AMBULATORY_CARE_PROVIDER_SITE_OTHER): Payer: Self-pay | Admitting: Family Medicine

## 2016-08-01 VITALS — BP 146/71 | HR 63 | Temp 97.9°F | Wt 173.0 lb

## 2016-08-01 DIAGNOSIS — E7800 Pure hypercholesterolemia, unspecified: Secondary | ICD-10-CM

## 2016-08-01 DIAGNOSIS — I10 Essential (primary) hypertension: Secondary | ICD-10-CM

## 2016-08-01 DIAGNOSIS — D649 Anemia, unspecified: Secondary | ICD-10-CM | POA: Insufficient documentation

## 2016-08-01 DIAGNOSIS — E119 Type 2 diabetes mellitus without complications: Secondary | ICD-10-CM

## 2016-08-01 DIAGNOSIS — R079 Chest pain, unspecified: Secondary | ICD-10-CM

## 2016-08-01 LAB — CBC WITH DIFFERENTIAL/PLATELET
HEMATOCRIT: 29.6 % — AB (ref 37.5–51.0)
Hemoglobin: 9 g/dL — ABNORMAL LOW (ref 12.6–17.7)
LYMPHS ABS: 1.5 10*3/uL (ref 0.7–3.1)
Lymphs: 32 %
MCH: 22.4 pg — ABNORMAL LOW (ref 26.6–33.0)
MCHC: 30.4 g/dL — ABNORMAL LOW (ref 31.5–35.7)
MCV: 74 fL — AB (ref 79–97)
MID (ABSOLUTE): 0.5 10*3/uL (ref 0.1–1.6)
MID: 11 %
Neutrophils Absolute: 2.7 10*3/uL (ref 1.4–7.0)
Neutrophils: 57 %
Platelets: 320 10*3/uL (ref 150–379)
RBC: 4.01 x10E6/uL — AB (ref 4.14–5.80)
RDW: 18.1 % — AB (ref 12.3–15.4)
WBC: 4.7 10*3/uL (ref 3.4–10.8)

## 2016-08-01 LAB — LIPID PANEL PICCOLO, WAIVED
CHOLESTEROL PICCOLO, WAIVED: 104 mg/dL (ref <200)
Chol/HDL Ratio Piccolo,Waive: 2.3 mg/dL
HDL CHOL PICCOLO, WAIVED: 44 mg/dL — AB (ref >59)
LDL CHOL CALC PICCOLO WAIVED: 50 mg/dL (ref <100)
Triglycerides Piccolo,Waived: 49 mg/dL (ref <150)
VLDL Chol Calc Piccolo,Waive: 10 mg/dL (ref <30)

## 2016-08-01 LAB — URINALYSIS, ROUTINE W REFLEX MICROSCOPIC
BILIRUBIN UA: NEGATIVE
Glucose, UA: NEGATIVE
Ketones, UA: NEGATIVE
Leukocytes, UA: NEGATIVE
Nitrite, UA: NEGATIVE
PROTEIN UA: NEGATIVE
RBC UA: NEGATIVE
SPEC GRAV UA: 1.005 (ref 1.005–1.030)
UUROB: 0.2 mg/dL (ref 0.2–1.0)
pH, UA: 5 (ref 5.0–7.5)

## 2016-08-01 LAB — BAYER DCA HB A1C WAIVED: HB A1C (BAYER DCA - WAIVED): 6.7 % (ref <7.0)

## 2016-08-01 MED ORDER — FERROUS SULFATE 325 (65 FE) MG PO TABS: 325.0000 mg | ORAL_TABLET | Freq: Two times a day (BID) | ORAL | 12 refills | Status: DC

## 2016-08-01 NOTE — Progress Notes (Signed)
BP (!) 146/71   Pulse 63   Temp 97.9 F (36.6 C)   Wt 173 lb (78.5 kg)   SpO2 99%   BMI 26.15 kg/m    Subjective:    Patient ID: Stanley Dickerson, male    DOB: 07/26/1961, 55 y.o.   MRN: [id number]  HPI: Stanley Dickerson is a 55 y.o. male  Chief Complaint  Patient presents with  . Dizziness    started this morning, has checked his glucose and that was good   Patient presenting this morning with complaints of chest pain and dizzy spell upon waking. Went to work but was [facility] [employer] [facility] for BP and blood sugar check, both of which were normal.   Chest pain waxing and waning for past 7 days, reports pain was sharp and located more of the right side. It was  accompanied by SOB and diaphoresis lasting only a couple of seconds. Denies headaches, blurred vision, syncopal episodes and symptoms are not noticed with positional changes.    Relevant past medical, surgical, family and social history reviewed and updated as indicated. Interim medical history since our last visit reviewed. Allergies and medications reviewed and updated.  Review of Systems  Respiratory: Positive for chest tightness and shortness of breath.   Gastrointestinal: Positive for diarrhea. Negative for abdominal pain.  Neurological: Positive for dizziness. Negative for syncope and headaches.    Per HPI unless specifically indicated above     Objective:    BP (!) 146/71   Pulse 63   Temp 97.9 F (36.6 C)   Wt 173 lb (78.5 kg)   SpO2 99%   BMI 26.15 kg/m   Wt Readings from Last 3 Encounters:  08/01/16 173 lb (78.5 kg)  01/31/16 174 lb (78.9 kg)  10/20/15 176 lb (79.8 kg)    Physical Exam  Constitutional: He is oriented to person, place, and time. He appears well-developed and well-nourished. No distress.  Cardiovascular: Normal rate, regular rhythm and normal heart sounds.   No bruits noted.   Pulmonary/Chest: Effort normal and breath sounds normal.  Neurological: He is alert and oriented to  person, place, and time.  Psychiatric: He has a normal mood and affect. His behavior is normal. Judgment and thought content normal.    Results for orders placed or performed in visit on 04/03/16  HM DIABETES EYE EXAM  Result Value Ref Range   HM Diabetic Eye Exam No Retinopathy No Retinopathy      Assessment & Plan:   Problem List Items Addressed This Visit      Cardiovascular and Mediastinum   Hypertension - Primary (Chronic)    Stable, continue current medications.       Relevant Orders   Comprehensive metabolic panel   Urinalysis, Routine w reflex microscopic (not at [facility])     Endocrine   Diabetes mellitus without complication (HCC) (Chronic)    A1C today 6.7. Continue current medications.       Relevant Orders   Comprehensive metabolic panel   Bayer DCA Hb Y8MA1c Waived   Urinalysis, Routine w reflex microscopic (not at [facility])     Other   Hyperlipidemia (Chronic)   Relevant Orders   Comprehensive metabolic panel   Urinalysis, Routine w reflex microscopic (not [facility])   Lipid Panel Piccolo, Waived   Chest pain   Relevant Orders   Comprehensive metabolic panel   CBC With Differential/Platelet   EKG 12-Lead (Completed)   Lipid Panel Piccolo, CougarWaived    Other Visit  Diagnoses   None.      Follow up plan: No Follow-up on file.

## 2016-08-01 NOTE — Assessment & Plan Note (Signed)
The current medical regimen is effective;  continue present plan and medications.  

## 2016-08-01 NOTE — Assessment & Plan Note (Signed)
Stable, continue current medications.  

## 2016-08-01 NOTE — Assessment & Plan Note (Signed)
EKG reviewed and compared to previous EKGs done by cardiology and by us with no change Feel the patient's symptoms are noncardiac

## 2016-08-01 NOTE — Assessment & Plan Note (Addendum)
The current medical regimen is effective;  continue present plan and medications.  

## 2016-08-01 NOTE — Progress Notes (Signed)
BP (!) 146/71   Pulse 63   Temp 97.9 F (36.6 C)   Wt 173 lb (78.5 kg)   SpO2 99%   BMI 26.15 kg/m    Subjective:    Patient ID: Stanley Dickerson, male    DOB: July 19, 1961, 55 y.o.   MRN: [id number]  HPI: Stanley Dickerson is a 55 y.o. male  Chief Complaint  Patient presents with  . Dizziness    started this morning, has checked his glucose and that was good  Patient with dizziness when changing positions rapidly lasted a few minutes and is gone away hasn't had other dizzy spells. 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 He was taken to the ENT they checked his blood sugar which was in the 80s and blood pressure which was normal. He also had normal pulse. Patient otherwise with no other low blood sugar spells blood pressure spells or issues taking medications without problems or side effects. 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  Relevant past medical, surgical, family and social history reviewed and updated as indicated. Interim medical history since our last visit reviewed. Allergies and medications reviewed and updated.  Review of Systems  Constitutional: Negative.   HENT: Negative.   Eyes: Negative.   Respiratory: Negative.   Cardiovascular: Positive for chest pain. Negative for palpitations and leg swelling.  Gastrointestinal: Negative.  Negative for constipation, diarrhea and nausea.  Endocrine: Negative.   Genitourinary: Negative.   Musculoskeletal: Negative.   Allergic/Immunologic: Negative.   Neurological: Positive for dizziness and numbness. Negative for seizures and light-headedness.  Hematological: Negative.   Psychiatric/Behavioral: Negative.     Per HPI unless specifically indicated above     Objective:    BP (!) 146/71   Pulse 63   Temp 97.9 F (36.6 C)   Wt 173 lb (78.5 kg)   SpO2  99%   BMI 26.15 kg/m   Wt Readings from Last 3 Encounters:  08/01/16 173 lb (78.5 kg)  01/31/16 174 lb (78.9 kg)  10/20/15 176 lb (79.8 kg)    Physical Exam  Constitutional: He is oriented to person, place, and time. He appears well-developed and well-nourished. No distress.  HENT:  Head: Normocephalic and atraumatic.  Right Ear: Hearing normal.  Left Ear: Hearing normal.  Nose: Nose normal.  Eyes: Conjunctivae and lids are normal. Right eye exhibits no discharge. Left eye exhibits no discharge. No scleral icterus.  Neck: No thyromegaly present.  Cardiovascular: Normal rate, regular rhythm and normal heart sounds.   Pulmonary/Chest: Effort normal and breath sounds normal. No respiratory distress.  Abdominal: Soft. Bowel sounds are normal. He exhibits no distension. There is no tenderness. There is no rebound and no guarding.  Musculoskeletal: Normal range of motion.  Lymphadenopathy:    He has no cervical adenopathy.  Neurological: He is alert and oriented to person, place, and time.  Skin: Skin is intact. No rash noted.  Psychiatric: He has a normal mood and affect. His speech is normal and behavior is normal. Judgment and thought content normal. Cognition and memory are normal.    Results for orders placed or performed in visit on 04/03/16  HM DIABETES EYE EXAM  Result Value Ref Range   HM Diabetic Eye Exam No Retinopathy No Retinopathy      Assessment & Plan:   Problem List Items Addressed  This Visit      Cardiovascular and Mediastinum   Hypertension - Primary (Chronic)    Stable, continue current medications.       Relevant Orders   Comprehensive metabolic panel   Urinalysis, Routine w reflex microscopic (not at [facility])     Endocrine   Diabetes mellitus without complication (HCC) (Chronic)    The current medical regimen is effective;  continue present plan and medications.       Relevant Orders   Comprehensive metabolic panel   Bayer DCA Hb O9G Waived    Urinalysis, Routine w reflex microscopic (not at [facility])     Other   Hyperlipidemia (Chronic)    The current medical regimen is effective;  continue present plan and medications.       Relevant Orders   Comprehensive metabolic panel   Urinalysis, Routine w reflex microscopic (not at [facility])   Lipid Panel Piccolo, Waived   Chest pain    EKG reviewed and compared to previous EKGs done by cardiology and by [place] with no change Feel the patient's symptoms are noncardiac      Relevant Orders   Comprehensive metabolic panel   CBC With Differential/Platelet   EKG 12-Lead (Completed)   Lipid Panel Piccolo, Waived   Anemia    Because of patient's symptoms and possible threat to life or bodily function CBC was also done and because of previously very mild anemia. Patient had previously been recommended for colonoscopy which was deferred by patient. Patient now with hemoglobin of 9 possibly explaining patient's symptoms. This was unexpectedly low and discussed with patient. Will check further anemia studies with iron reticulocyte count Urgent GI referral because of anemia Discussed with patient moderation with activities but okay to continue current activities       Relevant Medications   ferrous sulfate 325 (65 FE) MG tablet   Other Relevant Orders   Reticulocytes   Ferritin   Folate   Iron and TIBC   Ambulatory referral to Gastroenterology    Other Visit Diagnoses   None.      Follow up plan: Return in about 4 weeks (around 08/29/2016) for Pending results.

## 2016-08-01 NOTE — Assessment & Plan Note (Signed)
Because of patient's symptoms and possible threat to life or bodily function CBC was also done and because of previously very mild anemia. Patient had previously been recommended for colonoscopy which was deferred by patient. Patient now with hemoglobin of 9 possibly explaining patient's symptoms. This was unexpectedly low and discussed with patient. Will check further anemia studies with iron reticulocyte count Urgent GI referral because of anemia Discussed with patient moderation with activities but okay to continue current activities

## 2016-08-02 ENCOUNTER — Telehealth: Payer: Self-pay

## 2016-08-02 ENCOUNTER — Encounter: Payer: Self-pay | Admitting: Family Medicine

## 2016-08-02 ENCOUNTER — Other Ambulatory Visit: Payer: Self-pay

## 2016-08-02 LAB — COMPREHENSIVE METABOLIC PANEL
A/G RATIO: 1.7 (ref 1.2–2.2)
ALBUMIN: 4.3 g/dL (ref 3.5–5.5)
ALK PHOS: 56 IU/L (ref 39–117)
ALT: 12 IU/L (ref 0–44)
AST: 17 IU/L (ref 0–40)
BUN/Creatinine Ratio: 33 — ABNORMAL HIGH (ref 9–20)
BUN: 23 mg/dL (ref 6–24)
Bilirubin Total: 0.5 mg/dL (ref 0.0–1.2)
CO2: 22 mmol/L (ref 18–29)
CREATININE: 0.69 mg/dL — AB (ref 0.76–1.27)
Calcium: 9.6 mg/dL (ref 8.7–10.2)
Chloride: 99 mmol/L (ref 96–106)
GFR calc Af Amer: 122 mL/min/{1.73_m2} (ref >59)
GFR calc non Af Amer: 105 mL/min/{1.73_m2} (ref >59)
GLOBULIN, TOTAL: 2.5 g/dL (ref 1.5–4.5)
Glucose: 79 mg/dL (ref 65–99)
POTASSIUM: 4.6 mmol/L (ref 3.5–5.2)
SODIUM: 138 mmol/L (ref 134–144)
Total Protein: 6.8 g/dL (ref 6.0–8.5)

## 2016-08-02 LAB — FOLATE: Folate: >20 ng/mL (ref >3.0)

## 2016-08-02 LAB — FERRITIN: Ferritin: 5 ng/mL — ABNORMAL LOW (ref 30–400)

## 2016-08-02 LAB — IRON AND TIBC
Iron Saturation: 5 % — CL (ref 15–55)
Iron: 21 ug/dL — ABNORMAL LOW (ref 38–169)
Total Iron Binding Capacity: 409 ug/dL (ref 250–450)
UIBC: 388 ug/dL — AB (ref 111–343)

## 2016-08-02 LAB — RETICULOCYTES: RETIC CT PCT: 1.4 % (ref 0.6–2.6)

## 2016-08-02 MED ORDER — NA SULFATE-K SULFATE-MG SULF 17.5-3.13-1.6 GM/177ML PO SOLN: 1.0000 | Freq: Once | ORAL | 0 refills | Status: AC

## 2016-08-02 NOTE — Telephone Encounter (Signed)
I called the patient to schedule him for a urgent colonoscopy on the 25 th of October. Patient said that he will be out of town on that day. I emphasized that this is a an urgent procedure. He stated his physician said it wasn't urgent. He told me he'd call me back with a date. He called back 2 minutes later and said he could do it on the 27 th. He could not stay on the phone to do a triage with me because he is about to get on a plane. As of right now, I will schedule his procedure but I do not have any of his insurance information or medication list. I will call him tomorrow and e-mail his instructions to [email or web address]

## 2016-08-03 ENCOUNTER — Other Ambulatory Visit: Payer: Self-pay

## 2016-08-03 ENCOUNTER — Telehealth: Payer: Self-pay

## 2016-08-03 NOTE — Telephone Encounter (Signed)
Anemia D64.9 Colonoscopy & E[facility] Self pay Pre cert not required

## 2016-08-03 NOTE — Telephone Encounter (Signed)
Gastroenterology Pre-Procedure Review  Request Date: 08/10/2016 Requesting Physician: Dr. Dossie Arbourrissman      PATIENT REVIEW QUESTIONS: The patient responded to the following health history questions as indicated:    1. Are you having any GI issues? no 2. Do you have a personal history of Polyps? no 3. Do you have a family history of Colon Cancer or Polyps? unknown 4. Diabetes Mellitus? yes (unknown) 5. Joint replacements in the past 12 months?no 6. Major health problems in the past 3 months?no 7. Any artificial heart valves, MVP, or defibrillator?no    MEDICATIONS & ALLERGIES:    Patient reports the following regarding taking any anticoagulation/antiplatelet therapy:   Plavix, Coumadin, Eliquis, Xarelto, Lovenox, Pradaxa, Brilinta, or Effient? no Aspirin? yes (Heart Health)  Patient confirms/reports the following medications:  Current Outpatient Prescriptions  Medication Sig Dispense Refill  . aspirin EC 81 MG tablet Take 81 mg by mouth daily.    . benazepril (LOTENSIN) 40 MG tablet Take 1 tablet (40 mg total) by mouth daily. 90 tablet 4  . ferrous sulfate 325 (65 FE) MG tablet Take 1 tablet (325 mg total) by mouth 2 (two) times daily with a meal. 60 tablet 12  . glyBURIDE (DIABETA) 5 MG tablet Take 1 tablet (5 mg total) by mouth daily with breakfast. 90 tablet 4  . metFORMIN (GLUCOPHAGE) 500 MG tablet Take 2 tablets (1,000 mg total) by mouth 2 (two) times daily with a meal. 360 tablet 4  . simvastatin (ZOCOR) 40 MG tablet Take 1 tablet (40 mg total) by mouth daily. 90 tablet 4   No current facility-administered medications for this visit.     Patient confirms/reports the following allergies:  No Known Allergies  No orders of the defined types were placed in this encounter.   AUTHORIZATION INFORMATION Primary Insurance: 1D#: Group #:  Secondary Insurance: 1D#: Group #:  SCHEDULE INFORMATION: Date: 08/10/2016 Time: Location: [facility]

## 2016-08-03 NOTE — Telephone Encounter (Signed)
Noted! Thank you

## 2016-08-10 ENCOUNTER — Ambulatory Visit: Payer: Self-pay | Admitting: Anesthesiology

## 2016-08-10 ENCOUNTER — Ambulatory Visit
Admission: RE | Admit: 2016-08-10 | Discharge: 2016-08-10 | Disposition: A | Discharge status: Home / Self Care | Payer: Self-pay | Source: Ambulatory Visit | Attending: Gastroenterology | Admitting: Gastroenterology

## 2016-08-10 ENCOUNTER — Encounter
Admission: RE | Disposition: A | Discharge status: Home / Self Care | Payer: Self-pay | Source: Ambulatory Visit | Attending: Gastroenterology

## 2016-08-10 DIAGNOSIS — K3189 Other diseases of stomach and duodenum: Secondary | ICD-10-CM | POA: Insufficient documentation

## 2016-08-10 DIAGNOSIS — D509 Iron deficiency anemia, unspecified: Secondary | ICD-10-CM | POA: Insufficient documentation

## 2016-08-10 DIAGNOSIS — Z7982 Long term (current) use of aspirin: Secondary | ICD-10-CM | POA: Insufficient documentation

## 2016-08-10 DIAGNOSIS — F419 Anxiety disorder, unspecified: Secondary | ICD-10-CM | POA: Insufficient documentation

## 2016-08-10 DIAGNOSIS — E785 Hyperlipidemia, unspecified: Secondary | ICD-10-CM | POA: Insufficient documentation

## 2016-08-10 DIAGNOSIS — Z7984 Long term (current) use of oral hypoglycemic drugs: Secondary | ICD-10-CM | POA: Insufficient documentation

## 2016-08-10 DIAGNOSIS — I10 Essential (primary) hypertension: Secondary | ICD-10-CM | POA: Insufficient documentation

## 2016-08-10 DIAGNOSIS — K210 Gastro-esophageal reflux disease with esophagitis: Secondary | ICD-10-CM | POA: Insufficient documentation

## 2016-08-10 DIAGNOSIS — E119 Type 2 diabetes mellitus without complications: Secondary | ICD-10-CM | POA: Insufficient documentation

## 2016-08-10 HISTORY — PX: COLONOSCOPY WITH PROPOFOL: SHX5780

## 2016-08-10 HISTORY — PX: ESOPHAGOGASTRODUODENOSCOPY (EGD) WITH PROPOFOL: SHX5813

## 2016-08-10 LAB — GLUCOSE, CAPILLARY: GLUCOSE-CAPILLARY: 99 mg/dL (ref 65–99)

## 2016-08-10 SURGERY — COLONOSCOPY WITH PROPOFOL
Anesthesia: General

## 2016-08-10 MED ORDER — PHENYLEPHRINE HCL 10 MG/ML IJ SOLN
INTRAMUSCULAR | Status: DC | PRN
  Administered 2016-08-10 (×2): 100 ug via INTRAVENOUS

## 2016-08-10 MED ORDER — PROPOFOL 10 MG/ML IV BOLUS
INTRAVENOUS | Status: DC | PRN
  Administered 2016-08-10: 70 mg via INTRAVENOUS

## 2016-08-10 MED ORDER — SODIUM CHLORIDE 0.9 % IV SOLN
INTRAVENOUS | Status: DC
  Administered 2016-08-10: 1000 mL via INTRAVENOUS

## 2016-08-10 MED ORDER — EPHEDRINE SULFATE 50 MG/ML IJ SOLN
INTRAMUSCULAR | Status: DC | PRN
  Administered 2016-08-10: 10 mg via INTRAVENOUS

## 2016-08-10 MED ORDER — PROPOFOL 500 MG/50ML IV EMUL
INTRAVENOUS | Status: DC | PRN
  Administered 2016-08-10: 175 ug/kg/min via INTRAVENOUS

## 2016-08-10 MED ORDER — LIDOCAINE HCL (CARDIAC) 20 MG/ML IV SOLN
INTRAVENOUS | Status: DC | PRN
  Administered 2016-08-10: 60 mg via INTRAVENOUS

## 2016-08-10 MED ORDER — GLYCOPYRROLATE 0.2 MG/ML IJ SOLN
INTRAMUSCULAR | Status: DC | PRN
  Administered 2016-08-10: 0.2 mg via INTRAVENOUS

## 2016-08-10 MED ORDER — MIDAZOLAM HCL 2 MG/2ML IJ SOLN
INTRAMUSCULAR | Status: DC | PRN
  Administered 2016-08-10 (×2): 1 mg via INTRAVENOUS

## 2016-08-10 NOTE — Discharge Instructions (Signed)
Sherian Valenza MD [address] [place], [place] [address] Phone: [phone] Fax : [phone]   YOU HAD AN ENDOSCOPIC PROCEDURE TODAY: Refer to the procedure report that was given to you for any specific questions about what was found during the examination.  If the procedure report does not answer your questions, please call your gastroenterologist to clarify.  YOU SHOULD EXPECT: Some feelings of bloating in the abdomen. Passage of more gas than usual.  Walking can help get rid of the air that was put into your GI tract during the procedure and reduce the bloating. If you had a lower endoscopy (such as a colonoscopy or flexible sigmoidoscopy) you may notice spotting of blood in your stool or on the toilet paper.   DIET: Your first meal following the procedure should be a light meal and then it is ok to progress to your normal diet.  A half-sandwich or bowl of soup is an example of a good first meal.  Heavy or fried foods are harder to digest and may make you feel nasueas or bloated.  Drink plenty of fluids but you should avoid alcoholic beverages for 24 hours.  ACTIVITY: Your care partner should take you home directly after the procedure.  You should plan to take it easy, moving slowly for the rest of the day.  You can resume normal activity the day after the procedure however you should NOT DRIVE, make legal decisions or use heavy machinery for 24 hours (because of the sedation medicines used during the test).    SYMPTOMS TO REPORT IMMEDIATELY  A gastroenterologist can be reached at any hour.  Please call your doctor's office for any of the following symptoms:   Following lower endoscopy (colonoscopy, flexible sigmoidoscopy)  Excessive amounts of blood in the stool  Significant tenderness, worsening of abdominal pains  Swelling of the abdomen that is new, acute  Fever of 100 or higher  Following upper endoscopy (EGD, EUS, ERCP)  Vomiting of blood or coffee ground material  New,  significant abdominal pain  New, significant chest pain or pain under the shoulder blades  Painful or persistently difficult swallowing  New shortness of breath  Black, tarry-looking stools  FOLLOW UP: If any biopsies were taken you will be contacted by phone or by letter within the next 1-3 weeks.  Call your gastroenterologist if you have not heard about the biopsies in 3 weeks.   Please also call your gastroenterologist's office with any specific questions about appointments or follow up tests. 

## 2016-08-10 NOTE — Op Note (Signed)
[facility] Gastroenterology Patient Name: Stanley Dickerson Procedure Date: 08/10/2016 8:14 AM MRN: [id number] Account #: [id number] Date of Birth: 09-29-1961 Admit Type: Ambulatory Age: 55 Room: [facility] ENDO ROOM 3 Gender: Male Note Status: Finalized Procedure:            Colonoscopy Indications:          Iron deficiency anemia Providers:            Wyline Mood MD, MD, Mick Sell. Alexander MD, MD Medicines:            Monitored Anesthesia Care Complications:        No immediate complications. Procedure:            Pre-Anesthesia Assessment:                       - Prior to the procedure, a History and Physical was                        performed, and patient medications, allergies and                        sensitivities were reviewed. The patient's tolerance of                        previous anesthesia was reviewed.                       - The anesthesia plan was to use moderate                        sedation/analgesia (conscious sedation).                       - The anesthesia plan was to use moderate                        sedation/analgesia (conscious sedation).                       After obtaining informed consent, the colonoscope was                        passed under direct vision. Throughout the procedure,                        the patient's blood pressure, pulse, and oxygen                        saturations were monitored continuously. The                        Colonoscope was introduced through the anus and                        advanced to the the cecum, identified by the                        appendiceal orifice, IC valve and transillumination.                        The colonoscopy was performed with ease. The patient  tolerated the procedure well. The quality of the bowel                        preparation was excellent. Findings:      The entire examined colon appeared normal on direct and retroflexion        views. Impression:           - The entire examined colon is normal on direct and                        retroflexion views.                       - No specimens collected. Recommendation:       - Discharge patient to home.                       - Resume regular diet.                       - Continue present medications.                       - Return to my office in 2 weeks.                       - Perform a colonoscopy in 10 years. Procedure Code(s):    --- Professional ---                       [phone], Colonoscopy, flexible; diagnostic, including                        collection of specimen(s) by brushing or washing, when                        performed (separate procedure) Diagnosis Code(s):    --- Professional ---                       D50.9, Iron deficiency anemia, unspecified CPT copyright 2016 American Medical Association. All rights reserved. The codes documented in this report are preliminary and upon coder review may  be revised to meet current compliance requirements. Attending Participation:      I personally performed the entire procedure. Wyline MoodKiran Bessy Reaney, MD Wyline MoodKiran Jax Abdelrahman MD, MD 08/10/2016 8:35:21 AM This report has been signed electronically. Antony HasteKaren P Alexander MD, MD Number of Addenda: 0 Note Initiated On: 08/10/2016 8:14 AM Scope Withdrawal Time: 0 hours 10 minutes 20 seconds  Total Procedure Duration: 0 hours 13 minutes 59 seconds       [facility]

## 2016-08-10 NOTE — Op Note (Signed)
[facility] Gastroenterology Patient Name: Stanley RakeMichael Dickerson Procedure Date: 08/10/2016 7:54 AM MRN: [id number] Account #: [id number] Date of Birth: 20-Sep-1961 Admit Type: Ambulatory Age: [age] [facility]OM 3 Gender: Male Note Status: Finalized Procedure:            Upper GI endoscopy Providers:            Wyline MoodKiran El Pile MD, MD Referring MD:         Steele SizerMark A. Crissman, MD (Referring MD) Medicines:            Monitored Anesthesia Care Complications:        No immediate complications. Procedure:            Pre-Anesthesia Assessment:                       - Prior to the procedure, a History and Physical was                        performed, and patient medications, allergies and                        sensitivities were reviewed. The patient's tolerance of                        previous anesthesia was reviewed.                       After obtaining informed consent, the endoscope was                        passed under direct vision. Throughout the procedure,                        the patient's blood pressure, pulse, and oxygen                        saturations were monitored continuously. The                        Colonoscope was introduced through the mouth, and                        advanced to the third part of duodenum. The upper GI                        endoscopy was accomplished with ease. The patient                        tolerated the procedure fairly well. The upper GI                        endoscopy was accomplished without difficulty. The                        patient tolerated the procedure. Findings:      The examined duodenum was normal. Biopsies for histology were taken with       a cold forceps for evaluation of celiac disease.      A single 8 mm mucosal papule (nodule) with no bleeding and no stigmata       of recent bleeding was found  in the prepyloric region of the stomach.       Biopsies were taken with a cold forceps for histology. Rest  of mucosa       was normal.      The Z-line was irregular and was found 40 cm from the incisors. This was       biopsied with a cold forceps for evaluation to rule out Barrett's       Esophagus. Biopsies for histology were taken with a cold forceps for       evaluation of celiac disease. Biopsies for histology were taken with a       cold forceps for evaluation of celiac disease. Impression:           - Normal examined duodenum. Biopsied.                       - Normal stomach.                       - Z-line irregular, 40 cm from the incisors. Biopsied. Recommendation:       - Discharge patient to home (with spouse).                       - Written discharge instructions were provided to the                        patient.                       - Resume previous diet. Procedure Code(s):    --- Professional ---                       [phone], Esophagogastroduodenoscopy, flexible, transoral;                        with biopsy, single or multiple Diagnosis Code(s):    --- Professional ---                       K22.8, Other specified diseases of esophagus CPT copyright 2016 American Medical Association. All rights reserved. The codes documented in this report are preliminary and upon coder review may  be revised to meet current compliance requirements. Attending Participation:      I personally performed the entire procedure. Wyline Mood, MD Wyline Mood MD, MD 08/10/2016 8:40:26 AM This report has been signed electronically. Number of Addenda: 0 Note Initiated On: 08/10/2016 7:54 AM      [facility]

## 2016-08-10 NOTE — Anesthesia Postprocedure Evaluation (Signed)
Anesthesia Post Note  Patient: Stanley RakeMichael Dickerson  Procedure(s) Performed: Procedure(s) (LRB): COLONOSCOPY WITH PROPOFOL (N/A) ESOPHAGOGASTRODUODENOSCOPY (EGD) WITH PROPOFOL (N/A)  Patient location during evaluation: PACU Anesthesia Type: General Level of consciousness: awake Pain management: pain level controlled Vital Signs Assessment: post-procedure vital signs reviewed and stable Respiratory status: spontaneous breathing Cardiovascular status: stable Anesthetic complications: no    Last Vitals:  Vitals:   08/10/16 0830 08/10/16 0840  BP: (!) 102/58 93/61  Pulse: 72   Resp: 16   Temp: (!) 36 C     Last Pain:  Vitals:   08/10/16 0830  TempSrc: Tympanic                 VAN STAVEREN,Monty Spicher

## 2016-08-10 NOTE — Anesthesia Preprocedure Evaluation (Signed)
Anesthesia Evaluation  Patient identified by MRN, date of birth, ID band Patient awake    Reviewed: Allergy & Precautions, NPO status , Patient's Chart, lab work & pertinent test results  Airway Mallampati: II       Dental  (+) Teeth Intact, Chipped   Pulmonary neg pulmonary ROS,    breath sounds clear to auscultation       Cardiovascular Exercise Tolerance: Good hypertension, Pt. on medications  Rhythm:Regular Rate:Normal     Neuro/Psych Anxiety negative neurological ROS     GI/Hepatic negative GI ROS,   Endo/Other  diabetes, Type 2, Oral Hypoglycemic Agents  Renal/GU negative Renal ROS     Musculoskeletal   Abdominal Normal abdominal exam  (+)   Peds negative pediatric ROS (+)  Hematology  (+) anemia ,   Anesthesia Other Findings   Reproductive/Obstetrics                             Anesthesia Physical Anesthesia Plan  ASA: II  Anesthesia Plan: General   Post-op Pain Management:    Induction: Intravenous  Airway Management Planned: Natural Airway and Nasal Cannula  Additional Equipment:   Intra-op Plan:   Post-operative Plan:   Informed Consent: I have reviewed the patients History and Physical, chart, labs and discussed the procedure including the risks, benefits and alternatives for the proposed anesthesia with the patient or authorized representative who has indicated his/her understanding and acceptance.     Plan Discussed with: CRNA  Anesthesia Plan Comments:         Anesthesia Quick Evaluation

## 2016-08-10 NOTE — H&P (Signed)
Wyline Mood MD [address] [place], [place] [address] Phone: [phone] Fax : [phone]  Primary Care Physician:  Vonita Moss, MD Primary Gastroenterologist:  Dr. Wyline Mood   Pre-Procedure History & Physical: HPI:  Stanley Dickerson is a 55 y.o. male is here for an EGD and colonoscopy to evaluate for iron deficiency anemia.   Past Medical History:  Diagnosis Date  . Anxiety   . Diabetes mellitus without complication (HCC)   . Hyperlipidemia   . Hypertension     Past Surgical History:  Procedure Laterality Date  . HERNIA REPAIR    . TONSILLECTOMY AND ADENOIDECTOMY      Prior to Admission medications   Medication Sig Start Date End Date Taking? Authorizing Provider  aspirin EC 81 MG tablet Take 81 mg by mouth daily.   Yes Historical Provider, MD  benazepril (LOTENSIN) 40 MG tablet Take 1 tablet (40 mg total) by mouth daily. 01/31/16  Yes Steele Sizer, MD  ferrous sulfate 325 (65 FE) MG tablet Take 1 tablet (325 mg total) by mouth 2 (two) times daily with a meal. 08/01/16  Yes Steele Sizer, MD  glyBURIDE (DIABETA) 5 MG tablet Take 1 tablet (5 mg total) by mouth daily with breakfast. 01/31/16  Yes Steele Sizer, MD  metFORMIN (GLUCOPHAGE) 500 MG tablet Take 2 tablets (1,000 mg total) by mouth 2 (two) times daily with a meal. 01/31/16  Yes Steele Sizer, MD  simvastatin (ZOCOR) 40 MG tablet Take 1 tablet (40 mg total) by mouth daily. 01/31/16  Yes Steele Sizer, MD    Allergies as of 08/03/2016  . (No Known Allergies)    Family History  Problem Relation Age of Onset  . Depression Mother   . Thyroid disease Mother   . Alzheimer's disease Mother   . Alcohol abuse Father   . Cancer Father     throat and lung  . Alzheimer's disease Maternal Grandmother     Social History   Social History  . Marital status: Single    Spouse name: N/A  . Number of children: N/A  . Years of education: N/A   Occupational History  . Not on file.   Social  History Main Topics  . Smoking status: Never Smoker  . Smokeless tobacco: Never Used  . Alcohol use Yes     Comment: rare  . Drug use: No  . Sexual activity: Not on file   Other Topics Concern  . Not on file   Social History Narrative  . No narrative on file    Review of Systems: See HPI, otherwise negative ROS  Physical Exam: BP (!) 150/74   Pulse 68   Temp (!) 96.3 F (35.7 C) (Tympanic)   Resp 16   Ht 5\' 11"  (1.803 m)   Wt 165 lb (74.8 kg)   SpO2 100%   BMI 23.01 kg/m  General:   Alert,  pleasant and cooperative in NAD Head:  Normocephalic and atraumatic. Neck:  Supple; no masses or thyromegaly. Lungs:  Clear throughout to auscultation.    Heart:  Regular rate and rhythm. Abdomen:  Soft, nontender and nondistended. Normal bowel sounds, without guarding, and without rebound.   Neurologic:  Alert and  oriented x4;  grossly normal neurologically.  Impression/Plan: Stanley Dickerson is here for an EGD/Colonoscopy  to be performed for iron deficiency anemia   Risks, benefits, limitations, and alternatives regarding  colonoscopy and gastroscopy have been reviewed with the patient.  Questions have been  answered.  All parties agreeable.   Wyline MoodKiran Dorthula Bier, MD  08/10/2016, 7:48 AM

## 2016-08-10 NOTE — Transfer of Care (Signed)
Immediate Anesthesia Transfer of Care Note  Patient: Stanley RakeMichael Castelluccio  Procedure(s) Performed: Procedure(s): COLONOSCOPY WITH PROPOFOL (N/A) ESOPHAGOGASTRODUODENOSCOPY (EGD) WITH PROPOFOL (N/A)  Patient Location: Endoscopy Unit  Anesthesia Type:General  Level of Consciousness: awake, alert , oriented and patient cooperative  Airway & Oxygen Therapy: Patient Spontanous Breathing and Patient connected to nasal cannula oxygen  Post-op Assessment: Report given to RN, Post -op Vital signs reviewed and stable and Patient moving all extremities X 4  Post vital signs: Reviewed and stable  Last Vitals:  Vitals:   08/10/16 0735  BP: (!) 150/74  Pulse: 68  Resp: 16  Temp: (!) 35.7 C    Last Pain:  Vitals:   08/10/16 0735  TempSrc: Tympanic         Complications: No apparent anesthesia complications

## 2016-08-13 ENCOUNTER — Encounter: Payer: Self-pay | Admitting: Gastroenterology

## 2016-08-13 LAB — SURGICAL PATHOLOGY

## 2016-08-31 ENCOUNTER — Telehealth: Payer: Self-pay

## 2016-08-31 NOTE — Telephone Encounter (Signed)
LVM for pt to return my call.

## 2016-08-31 NOTE — Telephone Encounter (Signed)
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 Follow up with me in 4-8 weeks

## 2016-09-04 NOTE — Telephone Encounter (Signed)
Left vm again for pt to return my call.  

## 2016-10-02 ENCOUNTER — Telehealth: Payer: Self-pay

## 2016-10-02 NOTE — Telephone Encounter (Signed)
Mailed letter requesting a call back.

## 2016-10-02 NOTE — Telephone Encounter (Signed)
-----   Message from Kiran Anna, MD sent at 08/29/2016  9:12 AM EST ----- bx shows gastric intestinal metaplasia with no H pylori - repeat EGD in 6 months for gastric mapping  He has some reflux esophagitis- he needs to be on a PPI omeprazole 40 mg once daily or equivalent. Follow up with me in 4-8 weeks 

## 2016-10-02 NOTE — Telephone Encounter (Signed)
No return call from pt. Mailed letter with results and request to contact us to schedule a follow up appt.

## 2017-01-17 ENCOUNTER — Other Ambulatory Visit: Payer: Self-pay | Admitting: Family Medicine

## 2017-01-17 MED ORDER — TRIAMCINOLONE ACETONIDE 0.1 % EX CREA: 1.0000 "application " | TOPICAL_CREAM | Freq: Two times a day (BID) | CUTANEOUS | 0 refills | Status: DC

## 2017-02-08 ENCOUNTER — Other Ambulatory Visit: Payer: Self-pay | Admitting: Family Medicine

## 2017-02-08 DIAGNOSIS — E119 Type 2 diabetes mellitus without complications: Secondary | ICD-10-CM

## 2017-02-08 NOTE — Telephone Encounter (Signed)
  Last routine OV: 08/01/16 Next OV: None on file.

## 2017-02-11 NOTE — Telephone Encounter (Signed)
apt 

## 2017-02-11 NOTE — Telephone Encounter (Signed)
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

## 2017-02-13 NOTE — Telephone Encounter (Signed)
Thank you.  I left him a voicemail with my office and cell phone number.

## 2017-03-21 ENCOUNTER — Other Ambulatory Visit: Payer: Self-pay | Admitting: Family Medicine

## 2017-03-21 DIAGNOSIS — I10 Essential (primary) hypertension: Secondary | ICD-10-CM

## 2017-03-21 DIAGNOSIS — E119 Type 2 diabetes mellitus without complications: Secondary | ICD-10-CM

## 2017-03-21 DIAGNOSIS — E785 Hyperlipidemia, unspecified: Secondary | ICD-10-CM

## 2017-03-21 NOTE — Telephone Encounter (Signed)
Last OV: 08/01/16 Next OV: 05/07/17   No results found for: HGBA1C    BMP Latest Ref Rng & Units 08/01/2016 04/09/2015  Glucose 65 - 99 mg/dL 79 161(W153(H)  BUN 6 - 24 mg/dL 23 96(E26(H)  Creatinine 4.540.76 - 1.27 mg/dL 0.98(J0.69(L) 1.911.12  BUN/Creat Ratio 9 - 20 33(H) -  Sodium 134 - 144 mmol/L 138 131(L)  Potassium 3.5 - 5.2 mmol/L 4.6 3.9  Chloride 96 - 106 mmol/L 99 95(L)  CO2 18 - 29 mmol/L 22 26  Calcium 8.7 - 10.2 mg/dL 9.6 9.3

## 2017-05-07 ENCOUNTER — Encounter: Payer: Self-pay | Admitting: Family Medicine

## 2017-05-14 ENCOUNTER — Ambulatory Visit (INDEPENDENT_AMBULATORY_CARE_PROVIDER_SITE_OTHER): Payer: Self-pay | Admitting: Family Medicine

## 2017-05-14 ENCOUNTER — Encounter: Payer: Self-pay | Admitting: Family Medicine

## 2017-05-14 VITALS — BP 123/70 | HR 63 | Ht 69.0 in | Wt 173.0 lb

## 2017-05-14 DIAGNOSIS — Z125 Encounter for screening for malignant neoplasm of prostate: Secondary | ICD-10-CM

## 2017-05-14 DIAGNOSIS — D509 Iron deficiency anemia, unspecified: Secondary | ICD-10-CM

## 2017-05-14 DIAGNOSIS — E7800 Pure hypercholesterolemia, unspecified: Secondary | ICD-10-CM

## 2017-05-14 DIAGNOSIS — Z0000 Encounter for general adult medical examination without abnormal findings: Secondary | ICD-10-CM

## 2017-05-14 DIAGNOSIS — I10 Essential (primary) hypertension: Secondary | ICD-10-CM

## 2017-05-14 DIAGNOSIS — E119 Type 2 diabetes mellitus without complications: Secondary | ICD-10-CM

## 2017-05-14 DIAGNOSIS — E785 Hyperlipidemia, unspecified: Secondary | ICD-10-CM

## 2017-05-14 DIAGNOSIS — Z1329 Encounter for screening for other suspected endocrine disorder: Secondary | ICD-10-CM

## 2017-05-14 MED ORDER — METFORMIN HCL 500 MG PO TABS: 1000.0000 mg | ORAL_TABLET | Freq: Two times a day (BID) | ORAL | 4 refills | Status: DC

## 2017-05-14 MED ORDER — SIMVASTATIN 40 MG PO TABS: 40.0000 mg | ORAL_TABLET | Freq: Every day | ORAL | 4 refills | Status: DC

## 2017-05-14 MED ORDER — BENAZEPRIL HCL 40 MG PO TABS: 40.0000 mg | ORAL_TABLET | Freq: Every day | ORAL | 4 refills | Status: DC

## 2017-05-14 MED ORDER — GLYBURIDE 5 MG PO TABS: 5.0000 mg | ORAL_TABLET | Freq: Every day | ORAL | 4 refills | Status: DC

## 2017-05-14 NOTE — Assessment & Plan Note (Signed)
Labs pending.  

## 2017-05-14 NOTE — Assessment & Plan Note (Signed)
The current medical regimen is effective;  continue present plan and medications.  

## 2017-05-14 NOTE — Progress Notes (Signed)
BP 123/70   Pulse 63   Ht 5\' 9"  (1.753 m)   Wt 173 lb (78.5 kg)   SpO2 98%   BMI 25.55 kg/m    Subjective:    Patient ID: Stanley Dickerson, male    DOB: 01/07/1961, 56 y.o.   MRN: [id number]  HPI: Stanley RakeMichael Virginia is a 56 y.o. male  Chief Complaint  Patient presents with  . Annual Exam   Patient all in all doing well still taking iron twice a day reviewed endoscopy colonoscopy which were both normal patient hasn't been taking any medication other than iron for his anemia. In his feeling back to normal . Of course wondering what to do with iron and his hemoglobin at this point. Patient also with ongoing fatigue but on daily review works very hard and has limited sleep. Maybe some occasional low blood sugar spells response to bread or glucose tablets. Does have some leg aches and myalgias has not tried stopping simvastatin and restarting simvastatin yet. Relevant past medical, surgical, family and social history reviewed and updated as indicated. Interim medical history since our last visit reviewed. Allergies and medications reviewed and updated.  Review of Systems  Constitutional: Negative.   HENT: Negative.   Eyes: Negative.   Respiratory: Negative.   Cardiovascular: Negative.   Gastrointestinal: Negative.   Endocrine: Negative.   Genitourinary: Negative.   Musculoskeletal: Negative.   Skin: Negative.   Allergic/Immunologic: Negative.   Neurological: Negative.   Hematological: Negative.   Psychiatric/Behavioral: Negative.     Per HPI unless specifically indicated above     Objective:    BP 123/70   Pulse 63   Ht 5\' 9"  (1.753 m)   Wt 173 lb (78.5 kg)   SpO2 98%   BMI 25.55 kg/m   Wt Readings from Last 3 Encounters:  05/14/17 173 lb (78.5 kg)  08/10/16 165 lb (74.8 kg)  08/01/16 173 lb (78.5 kg)    Physical Exam  Constitutional: He is oriented to person, place, and time. He appears well-developed and well-nourished.  HENT:  Head: Normocephalic and  atraumatic.  Right Ear: External ear normal.  Left Ear: External ear normal.  Eyes: Pupils are equal, round, and reactive to light. Conjunctivae and EOM are normal.  Neck: Normal range of motion. Neck supple.  Cardiovascular: Normal rate, regular rhythm, normal heart sounds and intact distal pulses.   Pulmonary/Chest: Effort normal and breath sounds normal.  Abdominal: Soft. Bowel sounds are normal. There is no splenomegaly or hepatomegaly.  Genitourinary: Rectum normal, prostate normal and penis normal.  Musculoskeletal: Normal range of motion.  Neurological: He is alert and oriented to person, place, and time. He has normal reflexes.  Skin: No rash noted. No erythema.  Psychiatric: He has a normal mood and affect. His behavior is normal. Judgment and thought content normal.    Results for orders placed or performed during the hospital encounter of 08/10/16  Glucose, capillary  Result Value Ref Range   Glucose-Capillary 99 65 - 99 mg/dL   Comment 1 IN EPIC   Surgical pathology  Result Value Ref Range   SURGICAL PATHOLOGY      Surgical Pathology CASE: [id number] PATIENT: Stanley RakeMICHAEL Ganas Surgical Pathology Report     SPECIMEN SUBMITTED: A. Duodenum, 3rd portion; cbx B. Nodule, pre pyloric area; cbx C. GEJ; cbx  CLINICAL HISTORY: None provided  PRE-OPERATIVE DIAGNOSIS: D64.9 anemia  POST-OPERATIVE DIAGNOSIS: Irregular GEJ R/O Barrett's, normal colon     DIAGNOSIS: A. DUODENUM; COLD BIOPSY: - DUODENAL  MUCOSA WITH PRESERVED VILLOUS ARCHITECTURE AND BRUNNER'S GLAND HYPERPLASIA. - NEGATIVE FOR INTRA-EPITHELIAL LYMPHOCYTOSIS, DYSPLASIA AND MALIGNANCY.  B. 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 - NEGATIVE FOR H. PYLORI, DYSPLASIA AND MALIGNANCY.  C.  GE JUNCTION; COLD BIOPSY: - REFLUX GASTROESOPHAGITIS. - NEGATIVE FOR GOBLET CELLS, DYSPLASIA AND MALIGNANCY.   GROSS DESCRIPTION: A. Labeled: Third  portion of duodenum cold biopsy  Tissue fragment(s): 4  Size: 0.2-0.4 cm  Description: Tan tissue fragments   Entirely submitted in one cassette(s).   B. Labeled: Nodule prepyloric area cold biopsy  Tissue fragment(s): 2  Size: 0.1 and 0.2 cm  Description: Pale pink tissue fragments  Entirely submitted in one cassette(s).   C. Labeled: GEJ cold biopsy  Tissue fragment(s): 2  Size: Each 0.2 cm  Description: Pink tissue fragments  Entirely submitted in one cassette(s).     Final Diagnosis performed by Glenice Bowana Baker, MD.  Electronically signed 08/13/2016 1:46:38PM    The electronic signature indicates that the named Attending Pathologist has evaluated the specimen  Technical component performed at [facility], [address], [place], [place] [address] Lab: [phone] Dir: Titus DubinWilliam F. Cato MulliganHancock, MD  Professional component performed at [facility], [facility], [address], [place], [place] [address] Lab: [phone] Dir: Georgiann Cockerara C. Oneita Krasubinas, MD        Assessment & Plan:   Problem List Items Addressed This Visit      Cardiovascular and Mediastinum   Hypertension (Chronic)    The current medical regimen is effective;  continue present plan and medications.       Relevant Medications   benazepril (LOTENSIN) 40 MG tablet   simvastatin (ZOCOR) 40 MG tablet   Other Relevant Orders   Comprehensive metabolic panel     Endocrine   Diabetes mellitus without complication (HCC) (Chronic)    The current medical regimen is effective;  continue present plan and medications.       Relevant Medications   benazepril (LOTENSIN) 40 MG tablet   glyBURIDE (DIABETA) 5 MG tablet   metFORMIN (GLUCOPHAGE) 500 MG tablet   simvastatin (ZOCOR) 40 MG tablet   Other Relevant Orders   CBC with Differential/Platelet   Urinalysis, Routine w reflex microscopic   Bayer DCA Hb A1c Waived     Other   Hyperlipidemia (Chronic)   Relevant Medications   benazepril  (LOTENSIN) 40 MG tablet   simvastatin (ZOCOR) 40 MG tablet   Other Relevant Orders   Lipid panel   Iron deficiency anemia    Labs pending      Relevant Orders   Ferritin    Other Visit Diagnoses    Annual physical exam    -  Primary   Thyroid disorder screen       Relevant Orders   TSH   Prostate cancer screening       Relevant Orders   PSA       Follow up plan: Return in about 3 months (around 08/14/2017) for Hemoglobin A1c.

## 2017-05-15 ENCOUNTER — Telehealth: Payer: Self-pay | Admitting: Family Medicine

## 2017-05-15 LAB — CBC WITH DIFFERENTIAL/PLATELET
BASOS ABS: 0.1 10*3/uL (ref 0.0–0.2)
Basos: 1 %
EOS (ABSOLUTE): 0.2 10*3/uL (ref 0.0–0.4)
Eos: 5 %
Hematocrit: 40.1 % (ref 37.5–51.0)
Hemoglobin: 12.5 g/dL — ABNORMAL LOW (ref 13.0–17.7)
IMMATURE GRANS (ABS): 0 10*3/uL (ref 0.0–0.1)
IMMATURE GRANULOCYTES: 0 %
LYMPHS: 27 %
Lymphocytes Absolute: 1.2 10*3/uL (ref 0.7–3.1)
MCH: 29.3 pg (ref 26.6–33.0)
MCHC: 31.2 g/dL — ABNORMAL LOW (ref 31.5–35.7)
MCV: 94 fL (ref 79–97)
MONOS ABS: 0.4 10*3/uL (ref 0.1–0.9)
Monocytes: 9 %
NEUTROS ABS: 2.7 10*3/uL (ref 1.4–7.0)
NEUTROS PCT: 58 %
PLATELETS: 265 10*3/uL (ref 150–379)
RBC: 4.27 x10E6/uL (ref 4.14–5.80)
RDW: 14.7 % (ref 12.3–15.4)
WBC: 4.6 10*3/uL (ref 3.4–10.8)

## 2017-05-15 LAB — COMPREHENSIVE METABOLIC PANEL
A/G RATIO: 2 (ref 1.2–2.2)
ALT: 19 IU/L (ref 0–44)
AST: 19 IU/L (ref 0–40)
Albumin: 4.5 g/dL (ref 3.5–5.5)
Alkaline Phosphatase: 53 IU/L (ref 39–117)
BILIRUBIN TOTAL: 0.4 mg/dL (ref 0.0–1.2)
BUN/Creatinine Ratio: 33 — ABNORMAL HIGH (ref 9–20)
BUN: 20 mg/dL (ref 6–24)
CHLORIDE: 101 mmol/L (ref 96–106)
CO2: 23 mmol/L (ref 20–29)
Calcium: 9.7 mg/dL (ref 8.7–10.2)
Creatinine, Ser: 0.6 mg/dL — ABNORMAL LOW (ref 0.76–1.27)
GFR calc non Af Amer: 111 mL/min/{1.73_m2} (ref >59)
GFR, EST AFRICAN AMERICAN: 128 mL/min/{1.73_m2} (ref >59)
GLUCOSE: 193 mg/dL — AB (ref 65–99)
Globulin, Total: 2.2 g/dL (ref 1.5–4.5)
POTASSIUM: 4.5 mmol/L (ref 3.5–5.2)
Sodium: 136 mmol/L (ref 134–144)
TOTAL PROTEIN: 6.7 g/dL (ref 6.0–8.5)

## 2017-05-15 LAB — LIPID PANEL
CHOLESTEROL TOTAL: 132 mg/dL (ref 100–199)
Chol/HDL Ratio: 3.5 ratio (ref 0.0–5.0)
HDL: 38 mg/dL — AB (ref >39)
LDL Calculated: 46 mg/dL (ref 0–99)
TRIGLYCERIDES: 238 mg/dL — AB (ref 0–149)
VLDL CHOLESTEROL CAL: 48 mg/dL — AB (ref 5–40)

## 2017-05-15 LAB — PSA: Prostate Specific Ag, Serum: 0.3 ng/mL (ref 0.0–4.0)

## 2017-05-15 LAB — FERRITIN: Ferritin: 26 ng/mL — ABNORMAL LOW (ref 30–400)

## 2017-05-15 LAB — TSH: TSH: 1.42 u[IU]/mL (ref 0.450–4.500)

## 2017-05-15 NOTE — Telephone Encounter (Signed)
Phone call Discussed with patient blood work returning to normal. Ferritin still low will continue iron.

## 2017-05-20 LAB — URINALYSIS, ROUTINE W REFLEX MICROSCOPIC
Bilirubin, UA: NEGATIVE
GLUCOSE, UA: NEGATIVE
Ketones, UA: NEGATIVE
LEUKOCYTES UA: NEGATIVE
Nitrite, UA: NEGATIVE
PH UA: 5 (ref 5.0–7.5)
PROTEIN UA: NEGATIVE
RBC, UA: NEGATIVE
Specific Gravity, UA: 1.025 (ref 1.005–1.030)
Urobilinogen, Ur: 0.2 mg/dL (ref 0.2–1.0)

## 2017-05-20 LAB — MICROSCOPIC EXAMINATION
BACTERIA UA: NONE SEEN
RBC, UA: NONE SEEN /hpf (ref 0–?)

## 2017-05-20 LAB — BAYER DCA HB A1C WAIVED: HB A1C: 6.3 % (ref <7.0)

## 2017-08-29 ENCOUNTER — Ambulatory Visit: Payer: Self-pay | Admitting: Family Medicine

## 2017-09-02 ENCOUNTER — Other Ambulatory Visit: Payer: Self-pay | Admitting: Family Medicine

## 2017-09-02 NOTE — Telephone Encounter (Signed)
apt 

## 2017-09-19 ENCOUNTER — Ambulatory Visit (INDEPENDENT_AMBULATORY_CARE_PROVIDER_SITE_OTHER): Payer: Self-pay | Admitting: Family Medicine

## 2017-09-19 VITALS — BP 132/80 | HR 68 | Wt 180.0 lb

## 2017-09-19 DIAGNOSIS — I10 Essential (primary) hypertension: Secondary | ICD-10-CM

## 2017-09-19 DIAGNOSIS — I739 Peripheral vascular disease, unspecified: Secondary | ICD-10-CM

## 2017-09-19 DIAGNOSIS — E7800 Pure hypercholesterolemia, unspecified: Secondary | ICD-10-CM

## 2017-09-19 DIAGNOSIS — E119 Type 2 diabetes mellitus without complications: Secondary | ICD-10-CM

## 2017-09-19 LAB — BAYER DCA HB A1C WAIVED: HB A1C (BAYER DCA - WAIVED): 6.9 % (ref <7.0)

## 2017-09-19 NOTE — Assessment & Plan Note (Signed)
Discussed claudication signs and symptoms will refer to vascular to further evaluate.

## 2017-09-19 NOTE — Assessment & Plan Note (Signed)
The current medical regimen is effective;  continue present plan and medications.  

## 2017-09-19 NOTE — Progress Notes (Signed)
BP 132/80   Pulse 68   Wt 180 lb (81.6 kg)   SpO2 99%   BMI 26.58 kg/m    Subjective:    Patient ID: Stanley Dickerson, male    DOB: April 25, 1961, 56 y.o.   MRN: [id number]  HPI: Stanley RakeMichael Fout is a 56 y.o. male  Diabetes f/u  Blood pressure doing well with no complaints. Takes Benzapril without issues. Cholesterol also doing well with medications. Blood sugar does well with no low blood sugar spells takes medications without problems or issues. Patient also follow-up had some leg complaints had stop simvastatin and then restarted simvastatand no real difference in leg complaints so will continue simvastatin especially with his been less expensive.  On further discussion patient's legs  Seem to be more bothersome with climbing steps with knee and calf aching goes away with rest and comes back with activity. Doesn't really have cramping.  Relevant past medical, surgical, family and social history reviewed and updated as indicated. Interim medical history since our last visit reviewed. Allergies and medications reviewed and updated.  Review of Systems  Constitutional: Negative.   Respiratory: Negative.   Cardiovascular: Negative.     Per HPI unless specifically indicated above     Objective:    BP 132/80   Pulse 68   Wt 180 lb (81.6 kg)   SpO2 99%   BMI 26.58 kg/m   Wt Readings from Last 3 Encounters:  09/19/17 180 lb (81.6 kg)  05/14/17 173 lb (78.5 kg)  08/10/16 165 lb (74.8 kg)    Physical Exam  Constitutional: He is oriented to person, place, and time. He appears well-developed and well-nourished.  HENT:  Head: Normocephalic and atraumatic.  Eyes: Conjunctivae and EOM are normal.  Neck: Normal range of motion.  Cardiovascular: Normal rate, regular rhythm and normal heart sounds.  Pulmonary/Chest: Effort normal and breath sounds normal.  Musculoskeletal: Normal range of motion.  Neurological: He is alert and oriented to person, place, and time.  Skin: No  erythema.  Psychiatric: He has a normal mood and affect. His behavior is normal. Judgment and thought content normal.    Results for orders placed or performed in visit on 05/14/17  Microscopic Examination  Result Value Ref Range   WBC, UA 0-5 0 - 5 /hpf   RBC, UA None seen 0 - 2 /hpf   Epithelial Cells (non renal) CANCELED    Bacteria, UA None seen None seen/Few  CBC with Differential/Platelet  Result Value Ref Range   WBC 4.6 3.4 - 10.8 x10E3/uL   RBC 4.27 4.14 - 5.80 x10E6/uL   Hemoglobin 12.5 (L) 13.0 - 17.7 g/dL   Hematocrit 78.240.1 95.637.5 - 51.0 %   MCV 94 79 - 97 fL   MCH 29.3 26.6 - 33.0 pg   MCHC 31.2 (L) 31.5 - 35.7 g/dL   RDW 21.314.7 08.612.3 - 57.815.4 %   Platelets 265 150 - 379 x10E3/uL   Neutrophils 58 Not Estab. %   Lymphs 27 Not Estab. %   Monocytes 9 Not Estab. %   Eos 5 Not Estab. %   Basos 1 Not Estab. %   Neutrophils Absolute 2.7 1.4 - 7.0 x10E3/uL   Lymphocytes Absolute 1.2 0.7 - 3.1 x10E3/uL   Monocytes Absolute 0.4 0.1 - 0.9 x10E3/uL   EOS (ABSOLUTE) 0.2 0.0 - 0.4 x10E3/uL   Basophils Absolute 0.1 0.0 - 0.2 x10E3/uL   Immature Granulocytes 0 Not Estab. %   Immature Grans (Abs) 0.0 0.0 - 0.1 x10E3/uL  Comprehensive metabolic panel  Result Value Ref Range   Glucose 193 (H) 65 - 99 mg/dL   BUN 20 6 - 24 mg/dL   Creatinine, Ser 1.910.60 (L) 0.76 - 1.27 mg/dL   GFR calc non Af Amer 111 >59 mL/min/1.73   GFR calc Af Amer 128 >59 mL/min/1.73   BUN/Creatinine Ratio 33 (H) 9 - 20   Sodium 136 134 - 144 mmol/L   Potassium 4.5 3.5 - 5.2 mmol/L   Chloride 101 96 - 106 mmol/L   CO2 23 20 - 29 mmol/L   Calcium 9.7 8.7 - 10.2 mg/dL   Total Protein 6.7 6.0 - 8.5 g/dL   Albumin 4.5 3.5 - 5.5 g/dL   Globulin, Total 2.2 1.5 - 4.5 g/dL   Albumin/Globulin Ratio 2.0 1.2 - 2.2   Bilirubin Total 0.4 0.0 - 1.2 mg/dL   Alkaline Phosphatase 53 39 - 117 IU/L   AST 19 0 - 40 IU/L   ALT 19 0 - 44 IU/L  Lipid panel  Result Value Ref Range   Cholesterol, Total 132 100 - 199 mg/dL    Triglycerides 478238 (H) 0 - 149 mg/dL   HDL 38 (L) >29>39 mg/dL   VLDL Cholesterol Cal 48 (H) 5 - 40 mg/dL   LDL Calculated 46 0 - 99 mg/dL   Chol/HDL Ratio 3.5 0.0 - 5.0 ratio  TSH  Result Value Ref Range   TSH 1.420 0.450 - 4.500 uIU/mL  Urinalysis, Routine w reflex microscopic  Result Value Ref Range   Specific Gravity, UA 1.025 1.005 - 1.030   pH, UA 5.0 5.0 - 7.5   Color, UA Yellow Yellow   Appearance Ur Clear Clear   Leukocytes, UA Negative Negative   Protein, UA Negative Negative/Trace   Glucose, UA Negative Negative   Ketones, UA Negative Negative   RBC, UA Negative Negative   Bilirubin, UA Negative Negative   Urobilinogen, Ur 0.2 0.2 - 1.0 mg/dL   Nitrite, UA Negative Negative   Microscopic Examination See below:   PSA  Result Value Ref Range   Prostate Specific Ag, Serum 0.3 0.0 - 4.0 ng/mL  Bayer DCA Hb A1c Waived  Result Value Ref Range   Bayer DCA Hb A1c Waived 6.3 <7.0 %  Ferritin  Result Value Ref Range   Ferritin 26 (L) 30 - 400 ng/mL      Assessment & Plan:   Problem List Items Addressed This Visit      Cardiovascular and Mediastinum   Hypertension (Chronic)    The current medical regimen is effective;  continue present plan and medications.         Endocrine   Diabetes mellitus without complication (HCC) - Primary (Chronic)    The current medical regimen is effective;  continue present plan and medications.       Relevant Orders   Bayer DCA Hb A1c Waived     Other   Hyperlipidemia (Chronic)    The current medical regimen is effective;  continue present plan and medications.       Relevant Orders   Bayer DCA Hb A1c Waived   Intermittent claudication [facility](HCC)    Discussed claudication signs and symptoms will refer to vascular to further evaluate.      Relevant Orders   Ambulatory referral to Vascular Surgery       Follow up plan: Return in about 6 months (around 03/20/2018) for Hemoglobin A1c, BMP,  Lipids, ALT, AST.

## 2017-10-14 ENCOUNTER — Encounter (INDEPENDENT_AMBULATORY_CARE_PROVIDER_SITE_OTHER): Payer: Self-pay | Admitting: Vascular Surgery

## 2017-10-22 ENCOUNTER — Encounter (INDEPENDENT_AMBULATORY_CARE_PROVIDER_SITE_OTHER): Payer: Self-pay | Admitting: Vascular Surgery

## 2017-10-22 ENCOUNTER — Ambulatory Visit (INDEPENDENT_AMBULATORY_CARE_PROVIDER_SITE_OTHER): Payer: Self-pay | Admitting: Vascular Surgery

## 2017-10-22 VITALS — BP 163/72 | HR 72 | Resp 16 | Ht 70.0 in | Wt 173.0 lb

## 2017-10-22 DIAGNOSIS — E119 Type 2 diabetes mellitus without complications: Secondary | ICD-10-CM

## 2017-10-22 DIAGNOSIS — E7800 Pure hypercholesterolemia, unspecified: Secondary | ICD-10-CM

## 2017-10-22 DIAGNOSIS — I70219 Atherosclerosis of native arteries of extremities with intermittent claudication, unspecified extremity: Secondary | ICD-10-CM | POA: Insufficient documentation

## 2017-10-22 DIAGNOSIS — M79604 Pain in right leg: Secondary | ICD-10-CM

## 2017-10-22 DIAGNOSIS — M79605 Pain in left leg: Secondary | ICD-10-CM

## 2017-10-22 NOTE — Progress Notes (Signed)
Subjective:    Patient ID: Stanley Dickerson, male    DOB: 10-Feb-1961, 57 y.o.   MRN: [id number] Chief Complaint  Patient presents with  . New Patient (Initial Visit)    Claudication   Presents as a new patient referred by Dr. Aline August for evaluation of "bilateral lower extremity pain".  The patient states experiencing bilateral lower extremity discomfort for "years".  He notes the pain is with activity and is located from the bilateral knees down the shins towards his feet and ankles.  The patient denies any thigh or calf cramping associated with activity.  The patient denies any rest pain or ulceration to the lower extremity.  The patient notes that he experiences "weakness" after sitting in a car for long periods of time.  The patient denies any edema to the bilateral lower extremity.  The patient experiences lower back pain with activity.  The patient works between 40 and 60 hours a week and walks approximately 3-5 miles a day.  The patient denies any fever, nausea vomiting.   Review of Systems  Constitutional: Negative.   HENT: Negative.   Eyes: Negative.   Respiratory: Negative.   Cardiovascular:       Bilateral lower extremity pain  Gastrointestinal: Negative.   Endocrine: Negative.   Genitourinary: Negative.   Musculoskeletal: Negative.   Skin: Negative.   Allergic/Immunologic: Negative.   Neurological: Negative.   Hematological: Negative.   Psychiatric/Behavioral: Negative.       Objective:   Physical Exam  Constitutional: He is oriented to person, place, and time. He appears well-developed and well-nourished. No distress.  HENT:  Head: Normocephalic and atraumatic.  Eyes: Conjunctivae are normal. Pupils are equal, round, and reactive to light.  Neck: Normal range of motion.  Cardiovascular: Normal rate, regular rhythm, normal heart sounds and intact distal pulses.  Pulses:      Radial pulses are 2+ on the right side, and 2+ on the left side.       Dorsalis pedis  pulses are 2+ on the right side, and 2+ on the left side.       Posterior tibial pulses are 2+ on the right side, and 2+ on the left side.  Pulmonary/Chest: Effort normal and breath sounds normal.  Musculoskeletal: Normal range of motion. He exhibits edema (Mild nonpitting edema noted to the bilateral lower extremity).  Neurological: He is alert and oriented to person, place, and time.  Skin: He is not diaphoretic.  Minimal less than 1 cm varicosities noted to bilateral lower extremity.  There is no stasis dermatitis.  There is no skin changes.  There is no cellulitis.  Psychiatric: He has a normal mood and affect. His behavior is normal. Judgment and thought content normal.  Vitals reviewed.  BP (!) 163/72 (BP Location: Right Arm, Patient Position: Sitting)   Pulse 72   Resp 16   Ht 5\' 10"  (1.778 m)   Wt 173 lb (78.5 kg)   BMI 24.82 kg/m   Past Medical History:  Diagnosis Date  . Anxiety   . Diabetes mellitus without complication (HCC)   . Hyperlipidemia   . Hypertension    Social History   Socioeconomic History  . Marital status: Single    Spouse name: Not on file  . Number of children: Not on file  . Years of education: Not on file  . Highest education level: Not on file  Social Needs  . Financial resource strain: Not on file  . Food insecurity - worry: Not  on file  . Food insecurity - inability: Not on file  . Transportation needs - medical: Not on file  . Transportation needs - non-medical: Not on file  Occupational History  . Not on file  Tobacco Use  . Smoking status: Never Smoker  . Smokeless tobacco: Never Used  Substance and Sexual Activity  . Alcohol use: Yes    Comment: rare  . Drug use: No  . Sexual activity: Not on file  Other Topics Concern  . Not on file  Social History Narrative  . Not on file   Past Surgical History:  Procedure Laterality Date  . COLONOSCOPY WITH PROPOFOL N/A 08/10/2016   Procedure: COLONOSCOPY WITH PROPOFOL;  Surgeon: Wyline MoodKiran  Anna, MD;  Location: [facility]COPY;  Service: Endoscopy;  Laterality: N/A;  . ESOPHAGOGASTRODUODENOSCOPY (EGD) WITH PROPOFOL N/A 08/10/2016   Procedure: ESOPHAGOGASTRODUODENOSCOPY (EGD) WITH PROPOFOL;  Surgeon: Wyline MoodKiran Anna, MD;  Location: [facility] ENDOSCOPY;  Service: Endoscopy;  Laterality: N/A;  . HERNIA REPAIR    . TONSILLECTOMY AND ADENOIDECTOMY     Family History  Problem Relation Age of Onset  . Depression Mother   . Thyroid disease Mother   . Alzheimer's disease Mother   . Alcohol abuse Father   . Cancer Father        throat and lung  . Alzheimer's disease Maternal Grandmother    No Known Allergies     Assessment & Plan:  Presents as a new patient referred by Dr. Aline Augustrissmon for evaluation of "bilateral lower extremity pain".  The patient states experiencing bilateral lower extremity discomfort for "years".  He notes the pain is with activity and is located from the bilateral knees down the shins towards his feet and ankles.  The patient denies any thigh or calf cramping associated with activity.  The patient denies any rest pain or ulceration to the lower extremity.  The patient notes that he experiences "weakness" after sitting in a car for long periods of time.  The patient denies any edema to the bilateral lower extremity.  The patient experiences lower back pain with activity.  The patient works between 40 and 60 hours a week and walks approximately 3-5 miles a day.  The patient denies any fever, nausea vomiting.  1. Diabetes mellitus without complication (HCC) - Stable Encouraged good control as its slows the progression of atherosclerotic disease  2. Pure hypercholesterolemia - Stable Encouraged good control as its slows the progression of atherosclerotic disease  3. Lower extremity pain, bilateral Patient with a mixture of arterial and venous disease. Patient with risks factors for peripheral artery disease I will bring the patient back to undergo an ABI to assess for any  contributing peripheral artery disease that may be causing his discomfort The patient was encouraged to wear graduated compression stockings (20-30 mmHg) on a daily basis. The patient was instructed to begin wearing the stockings first thing in the morning and removing them in the evening. The patient was instructed specifically not to sleep in the stockings. Prescription given In addition, behavioral modification including elevation during the day will be initiated. Anti-inflammatories for pain. I will bring the patient back to undergo a bilateral venous duplex exam to assess for any venous disease that may be contributing to his discomfort  Current Outpatient Medications on File Prior to Visit  Medication Sig Dispense Refill  . aspirin EC 81 MG tablet Take 81 mg by mouth daily.    . benazepril (LOTENSIN) 40 MG tablet Take 1 tablet (40 mg total)  by mouth daily. 90 tablet 4  . ferrous sulfate 325 (65 FE) MG tablet TAKE ONE TABLET BY MOUTH TWICE DAILY WITH A MEAL 60 tablet 12  . glyBURIDE (DIABETA) 5 MG tablet Take 1 tablet (5 mg total) by mouth daily with breakfast. 90 tablet 4  . metFORMIN (GLUCOPHAGE) 500 MG tablet Take 2 tablets (1,000 mg total) by mouth 2 (two) times daily with a meal. 360 tablet 4  . simvastatin (ZOCOR) 40 MG tablet Take 1 tablet (40 mg total) by mouth daily. 90 tablet 4  . triamcinolone cream (KENALOG) 0.1 % Apply 1 application topically 2 (two) times daily. 30 g 0   No current facility-administered medications on file prior to visit.    There are no Patient Instructions on file for this visit. No Follow-up on file.  Jasmane Brockway A Meiling Hendriks, PA-C

## 2017-11-20 ENCOUNTER — Ambulatory Visit (INDEPENDENT_AMBULATORY_CARE_PROVIDER_SITE_OTHER): Payer: Self-pay

## 2017-11-20 ENCOUNTER — Encounter (INDEPENDENT_AMBULATORY_CARE_PROVIDER_SITE_OTHER): Payer: Self-pay

## 2017-11-20 ENCOUNTER — Encounter (INDEPENDENT_AMBULATORY_CARE_PROVIDER_SITE_OTHER): Payer: Self-pay | Admitting: Vascular Surgery

## 2017-11-20 ENCOUNTER — Ambulatory Visit (INDEPENDENT_AMBULATORY_CARE_PROVIDER_SITE_OTHER): Payer: Self-pay | Admitting: Vascular Surgery

## 2017-11-20 VITALS — BP 122/63 | HR 64 | Resp 17 | Wt 172.8 lb

## 2017-11-20 DIAGNOSIS — E7800 Pure hypercholesterolemia, unspecified: Secondary | ICD-10-CM

## 2017-11-20 DIAGNOSIS — M79604 Pain in right leg: Secondary | ICD-10-CM

## 2017-11-20 DIAGNOSIS — M79605 Pain in left leg: Secondary | ICD-10-CM

## 2017-11-20 DIAGNOSIS — R600 Localized edema: Secondary | ICD-10-CM | POA: Insufficient documentation

## 2017-11-20 DIAGNOSIS — E119 Type 2 diabetes mellitus without complications: Secondary | ICD-10-CM

## 2017-11-20 NOTE — Progress Notes (Signed)
Subjective:    Patient ID: Stanley Dickerson, male    DOB: 02/03/1961, 57 y.o.   MRN: [id number] Chief Complaint  Patient presents with  . Follow-up    50month reflux,abi   Patient presents to review vascular studies.  The patient was last seen on October 22, 2017 for evaluation of claudication and lower extremity edema.  The patient's claudication-like symptoms remain and are stable.  The patient is seeing improvement to the edema located to the bilateral lower extremity through the use of medical grade 1 compression stockings and elevation.  The patient underwent a bilateral ABI which was notable for bilateral tibials normal digit waveforms.  No significant lower extremity arterial disease was found.  The bilateral toe brachial indices were normal.  There is no previous ankle-brachial index for comparison.  The patient was scheduled to undergo a venous duplex today however he is self-pay at the moment and since his symptoms have improved with conservative therapy we will hold off on this test.  If the patient's lower extremity edema should worsen the patient is to call our office and we will reschedule him to undergo a bilateral lower extremity venous duplex.  The patient does note some lower and neck back pain.  He has not undergone an x-ray of the spine and "many years".  The patient denies any fever, nausea or vomiting.   Review of Systems  Constitutional: Negative.   HENT: Negative.   Eyes: Negative.   Respiratory: Negative.   Cardiovascular: Positive for leg swelling.       Claudication  Gastrointestinal: Negative.   Endocrine: Negative.   Genitourinary: Negative.   Musculoskeletal: Negative.   Skin: Negative.   Allergic/Immunologic: Negative.   Neurological: Negative.   Hematological: Negative.   Psychiatric/Behavioral: Negative.       Objective:   Physical Exam  Constitutional: He is oriented to person, place, and time. He appears well-developed and well-nourished. No distress.   HENT:  Head: Normocephalic and atraumatic.  Eyes: Conjunctivae are normal. Pupils are equal, round, and reactive to light.  Neck: Normal range of motion.  Cardiovascular: Normal rate, regular rhythm, normal heart sounds and intact distal pulses.  Pulses:      Radial pulses are 2+ on the right side, and 2+ on the left side.       Dorsalis pedis pulses are 2+ on the right side, and 2+ on the left side.       Posterior tibial pulses are 2+ on the right side, and 2+ on the left side.  Pulmonary/Chest: Effort normal and breath sounds normal.  Musculoskeletal: Normal range of motion. He exhibits no edema (Minimal bilateral lower extremity edema noted).  Neurological: He is alert and oriented to person, place, and time.  Skin: Skin is warm and dry. He is not diaphoretic.  Psychiatric: He has a normal mood and affect. His behavior is normal. Judgment and thought content normal.  Vitals reviewed.  BP 122/63 (BP Location: Right Arm)   Pulse 64   Resp 17   Wt 172 lb 12.8 oz (78.4 kg)   BMI 24.79 kg/m   Past Medical History:  Diagnosis Date  . Anxiety   . Diabetes mellitus without complication (HCC)   . Hyperlipidemia   . Hypertension    Social History   Socioeconomic History  . Marital status: Single    Spouse name: Not on file  . Number of children: Not on file  . Years of education: Not on file  . Highest education  level: Not on file  Social Needs  . Financial resource strain: Not on file  . Food insecurity - worry: Not on file  . Food insecurity - inability: Not on file  . Transportation needs - medical: Not on file  . Transportation needs - non-medical: Not on file  Occupational History  . Not on file  Tobacco Use  . Smoking status: Never Smoker  . Smokeless tobacco: Never Used  Substance and Sexual Activity  . Alcohol use: Yes    Comment: rare  . Drug use: No  . Sexual activity: Not on file  Other Topics Concern  . Not on file  Social History Narrative  . Not on  file   Past Surgical History:  Procedure Laterality Date  . COLONOSCOPY WITH PROPOFOL N/A 08/10/2016   Procedure: COLONOSCOPY WITH PROPOFOL;  Surgeon: Wyline MoodKiran Anna, MD;  Location: [facility];  Service: Endoscopy;  Laterality: N/A;  . ESOPHAGOGASTRODUODENOSCOPY (EGD) WITH PROPOFOL N/A 08/10/2016   Procedure: ESOPHAGOGASTRODUODENOSCOPY (EGD) WITH PROPOFOL;  Surgeon: Wyline MoodKiran Anna, MD;  Location: [facility];  Service: Endoscopy;  Laterality: N/A;  . HERNIA REPAIR    . TONSILLECTOMY AND ADENOIDECTOMY     Family History  Problem Relation Age of Onset  . Depression Mother   . Thyroid disease Mother   . Alzheimer's disease Mother   . Alcohol abuse Father   . Cancer Father        throat and lung  . Alzheimer's disease Maternal Grandmother    No Known Allergies     Assessment & Plan:  Patient presents to review vascular studies.  The patient was last seen on October 22, 2017 for evaluation of claudication and lower extremity edema.  The patient's claudication-like symptoms remain and are stable.  The patient is seeing improvement to the edema located to the bilateral lower extremity through the use of medical grade 1 compression stockings and elevation.  The patient underwent a bilateral ABI which was notable for bilateral tibials normal digit waveforms.  No significant lower extremity arterial disease was found.  The bilateral toe brachial indices were normal.  There is no previous ankle-brachial index for comparison.  The patient was scheduled to undergo a venous duplex today however he is self-pay at the moment and since his symptoms have improved with conservative therapy we will hold off on this test.  If the patient's lower extremity edema should worsen the patient is to call our office and we will reschedule him to undergo a bilateral lower extremity venous duplex.  The patient does note some lower and neck back pain.  He has not undergone an x-ray of the spine and "many years".  The patient  denies any fever, nausea or vomiting.  1. Lower extremity pain, bilateral - Stable Continue claudication-like symptoms with and without activity ABI today without evidence of lower extremity arterial disease 2+ pedal pulses on exam The patient does have lower back and neck pain however has not undergone a x-ray of the spine in many years Recommend possibly following up with his primary care physician to undergo this He expresses understanding I do not feel the patient's claudication-like symptoms are coming from atherosclerotic or arterial occlusive disease  2. Bilateral lower extremity edema - Stable The patient was scheduled to undergo bilateral venous duplex today however he is self-pay since his symptoms have improved with medical grade 1 compression stockings and elevation I will hold off on this test. If the patient notices any worsening to his lower extremity edema or  an increase in his symptoms he is to call the office and we will bring him back in for bilateral lower extremity venous duplex to rule out any contributing venous disease The patient is to continue with conservative therapy and elevation  - VAS [place] LOWER EXTREMITY VENOUS REFLUX; Future  3. Pure hypercholesterolemia - Stable Encouraged good control as its slows the progression of atherosclerotic disease  4. Diabetes mellitus without complication (HCC) - Stable Encouraged good control as its slows the progression of atherosclerotic disease  Current Outpatient Medications on File Prior to Visit  Medication Sig Dispense Refill  . aspirin EC 81 MG tablet Take 81 mg by mouth daily.    . benazepril (LOTENSIN) 40 MG tablet Take 1 tablet (40 mg total) by mouth daily. 90 tablet 4  . ferrous sulfate 325 (65 FE) MG tablet TAKE ONE TABLET BY MOUTH TWICE DAILY WITH A MEAL 60 tablet 12  . glyBURIDE (DIABETA) 5 MG tablet Take 1 tablet (5 mg total) by mouth daily with breakfast. 90 tablet 4  . metFORMIN (GLUCOPHAGE) 500 MG tablet  Take 2 tablets (1,000 mg total) by mouth 2 (two) times daily with a meal. 360 tablet 4  . simvastatin (ZOCOR) 40 MG tablet Take 1 tablet (40 mg total) by mouth daily. 90 tablet 4  . triamcinolone cream (KENALOG) 0.1 % Apply 1 application topically 2 (two) times daily. 30 g 0   No current facility-administered medications on file prior to visit.    There are no Patient Instructions on file for this visit. No Follow-up on file.  Coraline Talwar A Kruti Horacek, PA-C

## 2018-03-20 ENCOUNTER — Encounter: Payer: Self-pay | Admitting: Family Medicine

## 2018-03-20 ENCOUNTER — Ambulatory Visit (INDEPENDENT_AMBULATORY_CARE_PROVIDER_SITE_OTHER): Payer: Self-pay | Admitting: Family Medicine

## 2018-03-20 VITALS — BP 143/75 | HR 60 | Wt 170.0 lb

## 2018-03-20 DIAGNOSIS — I10 Essential (primary) hypertension: Secondary | ICD-10-CM

## 2018-03-20 DIAGNOSIS — E119 Type 2 diabetes mellitus without complications: Secondary | ICD-10-CM

## 2018-03-20 DIAGNOSIS — E7800 Pure hypercholesterolemia, unspecified: Secondary | ICD-10-CM

## 2018-03-20 DIAGNOSIS — M79605 Pain in left leg: Secondary | ICD-10-CM

## 2018-03-20 DIAGNOSIS — M79604 Pain in right leg: Secondary | ICD-10-CM

## 2018-03-20 DIAGNOSIS — D649 Anemia, unspecified: Secondary | ICD-10-CM

## 2018-03-20 LAB — LP+ALT+AST PICCOLO, WAIVED
ALT (SGPT) Piccolo, Waived: 25 U/L (ref 10–47)
AST (SGOT) Piccolo, Waived: 22 U/L (ref 11–38)
Chol/HDL Ratio Piccolo,Waive: 2.8 mg/dL
Cholesterol Piccolo, Waived: 120 mg/dL
HDL Chol Piccolo, Waived: 43 mg/dL — ABNORMAL LOW
LDL Chol Calc Piccolo Waived: 57 mg/dL
Triglycerides Piccolo,Waived: 100 mg/dL
VLDL Chol Calc Piccolo,Waive: 20 mg/dL

## 2018-03-20 LAB — BAYER DCA HB A1C WAIVED: HB A1C (BAYER DCA - WAIVED): 6.4 %

## 2018-03-20 NOTE — Assessment & Plan Note (Signed)
The current medical regimen is effective;  continue present plan and medications.  

## 2018-03-20 NOTE — Assessment & Plan Note (Signed)
Discuss history of anemia and reviewed from GI patient to follow-up with GI has not occurred will refer back to GI for further follow-up check CBC and ferritin today.

## 2018-03-20 NOTE — Assessment & Plan Note (Signed)
Patient with bilateral lower extremity pain negative work-up from vascular. Patient does have diabetes but is been well controlled some decreased sensation but more atypical type sensation and symptoms will refer to neurology to further sort out and hopefully help with diagnosis and treatment.

## 2018-03-20 NOTE — Progress Notes (Signed)
BP (!) 143/75   Pulse 60   Wt 170 lb (77.1 kg)   SpO2 98%   BMI 24.39 kg/m    Subjective:    Patient ID: Stanley Dickerson, male    DOB: 23-Mar-1961, 57 y.o.   MRN: [id number]  HPI: Stanley Dickerson is a 57 y.o. male  Chief Complaint  Patient presents with  . Follow-up   Patient follow-up all in all doing well with no complaints.  Taking blood pressure cholesterol diabetes medications without problems or issues no low blood sugar spells no problems with medications. Claudication work-up was negative still having some leg pain issues. Wondering about his back if that is an issue.  Leg symptoms are primarily from the knee down primarily at night with some hurting and restless leg type symptoms.  And right now legs are hurting all the time. The symptoms are bad enough wants to have something done.  Patient also concerned still taking iron.  Never did respond to request from GI for further follow-up will check CBC today and refer back to GI.  Relevant past medical, surgical, family and social history reviewed and updated as indicated. Interim medical history since our last visit reviewed. Allergies and medications reviewed and updated.  Review of Systems  Constitutional: Negative.   Respiratory: Negative.   Cardiovascular: Negative.     Per HPI unless specifically indicated above     Objective:    BP (!) 143/75   Pulse 60   Wt 170 lb (77.1 kg)   SpO2 98%   BMI 24.39 kg/m   Wt Readings from Last 3 Encounters:  03/20/18 170 lb (77.1 kg)  11/20/17 172 lb 12.8 oz (78.4 kg)  10/22/17 173 lb (78.5 kg)    Physical Exam  Constitutional: He is oriented to person, place, and time. He appears well-developed and well-nourished.  HENT:  Head: Normocephalic and atraumatic.  Eyes: Conjunctivae and EOM are normal.  Neck: Normal range of motion.  Cardiovascular: Normal rate, regular rhythm and normal heart sounds.  Pulmonary/Chest: Effort normal and breath sounds normal.    Musculoskeletal: Normal range of motion.  Neurological: He is alert and oriented to person, place, and time.  Skin: No erythema.  Psychiatric: He has a normal mood and affect. His behavior is normal. Judgment and thought content normal.    Results for orders placed or performed in visit on 09/19/17  Bayer DCA Hb A1c Waived  Result Value Ref Range   HB A1C (BAYER DCA - WAIVED) 6.9 <7.0 %      Assessment & Plan:   Problem List Items Addressed This Visit      Cardiovascular and Mediastinum   Hypertension - Primary (Chronic)    The current medical regimen is effective;  continue present plan and medications.       Relevant Orders   Basic metabolic panel   Bayer DCA Hb H8I Waived   LP+ALT+AST [place], [place]     Endocrine   Diabetes mellitus without complication (HCC) (Chronic)    The current medical regimen is effective;  continue present plan and medications.       Relevant Orders   Basic metabolic panel   Bayer DCA Hb O9G Waived   LP+ALT+AST [place], Waived     Other   Hyperlipidemia (Chronic)    The current medical regimen is effective;  continue present plan and medications.       Relevant Orders   Basic metabolic panel   Bayer DCA Hb E9B Waived  LP+ALT+AST [place], Waived   Lower extremity pain, bilateral    Patient with bilateral lower extremity pain negative work-up from vascular. Patient does have diabetes but is been well controlled some decreased sensation but more atypical type sensation and symptoms will refer to neurology to further sort out and hopefully help with diagnosis and treatment.      Relevant Orders   Ambulatory referral to Neurology   RESOLVED: Anemia    Discuss history of anemia and reviewed from GI patient to follow-up with GI has not occurred will refer back to GI for further follow-up check CBC and ferritin today.      Relevant Orders   CBC with Differential/Platelet   Ferritin   Ambulatory referral to Gastroenterology        Follow up plan: Return in about 2 months (around 05/20/2018) for Physical Exam.

## 2018-03-21 LAB — BASIC METABOLIC PANEL
BUN/Creatinine Ratio: 27 — ABNORMAL HIGH (ref 9–20)
BUN: 19 mg/dL (ref 6–24)
CO2: 23 mmol/L (ref 20–29)
Calcium: 9.8 mg/dL (ref 8.7–10.2)
Chloride: 103 mmol/L (ref 96–106)
Creatinine, Ser: 0.7 mg/dL — ABNORMAL LOW (ref 0.76–1.27)
GFR calc Af Amer: 119 mL/min/{1.73_m2} (ref >59)
GFR calc non Af Amer: 103 mL/min/{1.73_m2} (ref >59)
Glucose: 93 mg/dL (ref 65–99)
POTASSIUM: 4.7 mmol/L (ref 3.5–5.2)
SODIUM: 139 mmol/L (ref 134–144)

## 2018-03-21 LAB — CBC WITH DIFFERENTIAL/PLATELET
BASOS: 0 %
Basophils Absolute: 0 10*3/uL (ref 0.0–0.2)
EOS (ABSOLUTE): 0.2 10*3/uL (ref 0.0–0.4)
Eos: 4 %
Hematocrit: 39 % (ref 37.5–51.0)
Hemoglobin: 12.7 g/dL — ABNORMAL LOW (ref 13.0–17.7)
IMMATURE GRANS (ABS): 0 10*3/uL (ref 0.0–0.1)
Immature Granulocytes: 0 %
LYMPHS ABS: 1.4 10*3/uL (ref 0.7–3.1)
LYMPHS: 31 %
MCH: 29.7 pg (ref 26.6–33.0)
MCHC: 32.6 g/dL (ref 31.5–35.7)
MCV: 91 fL (ref 79–97)
Monocytes Absolute: 0.4 10*3/uL (ref 0.1–0.9)
Monocytes: 8 %
NEUTROS ABS: 2.6 10*3/uL (ref 1.4–7.0)
Neutrophils: 57 %
Platelets: 261 10*3/uL (ref 150–450)
RBC: 4.27 x10E6/uL (ref 4.14–5.80)
RDW: 14.7 % (ref 12.3–15.4)
WBC: 4.6 10*3/uL (ref 3.4–10.8)

## 2018-03-21 LAB — FERRITIN: Ferritin: 20 ng/mL — ABNORMAL LOW (ref 30–400)

## 2018-04-04 ENCOUNTER — Encounter: Payer: Self-pay | Admitting: Family Medicine

## 2018-04-28 ENCOUNTER — Encounter: Payer: Self-pay | Admitting: Gastroenterology

## 2018-04-28 ENCOUNTER — Ambulatory Visit: Payer: Self-pay | Admitting: Gastroenterology

## 2018-04-28 VITALS — BP 152/74 | HR 73 | Ht 70.0 in | Wt 167.0 lb

## 2018-04-28 DIAGNOSIS — D509 Iron deficiency anemia, unspecified: Secondary | ICD-10-CM

## 2018-04-28 NOTE — Progress Notes (Signed)
Wyline MoodKiran Earnest Mcgillis MD, MRCP(U.K) [address]  [address]  [place], [place] [address]  Main: [phone]  Fax: [phone]   Gastroenterology Consultation  Referring Provider:     Steele Sizerrissman, Mark A, MD Primary Care Physician:  Steele Sizerrissman, Mark A, MD Primary Gastroenterologist:  Dr. Wyline MoodKiran Massiah Minjares  Reason for Consultation:     Anemia         HPI:   Keith RakeMichael Seidel is a [age] y.o. y/o male referred for consultation & management  by Dr. Dossie Arbourrissman, Redge GainerMark A, MD.    He has been referred for anemia. [facility] , patient did not make the appointment.   He has been on iron . I performed an EGD+ colonoscopy back in 07/2016, he was never seen at the office. Gastric nodule noted with intestinal metaplasia. Suggested repeat EGD in 6 months and follow up in the GI clinic in 4-8 weeks which he didn't follow up . Bx at the GE junction showed reflux esophagitis. Colonoscopy was normal .    Ferritin has been between 5 and 26 since a year. Hb was 9 grams a year back and now 12.7 .   Taking oral iron- having dark . Denies any blood in stool , urine , nose. He does not take any NSAID's. No family history of colon cancer or polyps. Likes to eat a lot of ice/freeze pops.   Past Medical History:  Diagnosis Date  . Anxiety   . Diabetes mellitus without complication (HCC)   . Hyperlipidemia   . Hypertension     Past Surgical History:  Procedure Laterality Date  . COLONOSCOPY WITH PROPOFOL N/A 08/10/2016   Procedure: COLONOSCOPY WITH PROPOFOL;  Surgeon: Wyline MoodKiran Bela Nyborg, MD;  Location: [facility] ENDOSCOPY;  Service: Endoscopy;  Laterality: N/A;  . ESOPHAGOGASTRODUODENOSCOPY (EGD) WITH PROPOFOL N/A 08/10/2016   Procedure: ESOPHAGOGASTRODUODENOSCOPY (EGD) WITH PROPOFOL;  Surgeon: Wyline MoodKiran Josalin Carneiro, MD;  Location: [facility] ENDOSCOPY;  Service: Endoscopy;  Laterality: N/A;  . HERNIA REPAIR    . TONSILLECTOMY AND ADENOIDECTOMY      Prior to Admission medications   Medication Sig Start Date  End Date Taking? Authorizing Provider  aspirin EC 81 MG tablet Take 81 mg by mouth daily.    [provider]  benazepril (LOTENSIN) 40 MG tablet Take 1 tablet (40 mg total) by mouth daily. 05/14/17   Steele Sizerrissman, Mark A, MD  ferrous sulfate 325 (65 FE) MG tablet TAKE ONE TABLET BY MOUTH TWICE DAILY WITH A MEAL 09/02/17   Steele Sizerrissman, Mark A, MD  glyBURIDE (DIABETA) 5 MG tablet Take 1 tablet (5 mg total) by mouth daily with breakfast. 05/14/17   Steele Sizerrissman, Mark A, MD  metFORMIN (GLUCOPHAGE) 500 MG tablet Take 2 tablets (1,000 mg total) by mouth 2 (two) times daily with a meal. 05/14/17   Crissman, Redge GainerMark A, MD  simvastatin (ZOCOR) 40 MG tablet Take 1 tablet (40 mg total) by mouth daily. 05/14/17   Steele Sizerrissman, Mark A, MD  triamcinolone cream (KENALOG) 0.1 % Apply 1 application topically 2 (two) times daily. 01/17/17   Steele Sizerrissman, Mark A, MD    Family History  Problem Relation Age of Onset  . Depression Mother   . Thyroid disease Mother   . Alzheimer's disease Mother   . Alcohol abuse Father   . Cancer Father        throat and lung  . Alzheimer's disease Maternal Grandmother      Social History   Tobacco Use  . Smoking status:  Never Smoker  . Smokeless tobacco: Never Used  Substance Use Topics  . Alcohol use: Yes    Comment: rare  . Drug use: No    Allergies as of 04/28/2018  . (No Known Allergies)    Review of Systems:    All systems reviewed and negative except where noted in HPI.   Physical Exam:  There were no vitals taken for this visit. No LMP for male patient. Psych:  Alert and cooperative. Normal mood and affect. General:   Alert,  Well-developed, well-nourished, pleasant and cooperative in NAD Head:  Normocephalic and atraumatic. Eyes:  Sclera clear, no icterus.   Conjunctiva pink. Ears:  Normal auditory acuity. Nose:  No deformity, discharge, or lesions. Mouth:  No deformity or lesions,oropharynx pink & moist. Neck:  Supple; no masses or thyromegaly. Lungs:   Respirations even and unlabored.  Clear throughout to auscultation.   No wheezes, crackles, or rhonchi. No acute distress. Heart:  Regular rate and rhythm; no murmurs, clicks, rubs, or gallops. Abdomen:  Normal bowel sounds.  No bruits.  Soft, non-tender and non-distended without masses, hepatosplenomegaly or hernias noted.  No guarding or rebound tenderness.    Msk:  Symmetrical without gross deformities. Good, equal movement & strength bilaterally. Pulses:  Normal pulses noted. Extremities:  No clubbing or edema.  No cyanosis. Neurologic:  Alert and oriented x3;  grossly normal neurologically. Skin:  Intact without significant lesions or rashes. No jaundice. Lymph Nodes:  No significant cervical adenopathy. Psych:  Alert and cooperative. Normal mood and affect.  Imaging Studies: No results found.  Assessment and Plan:   Kyen Taite is a 57 y.o. y/o male has been referred for iron deficiency anemia.  Plan  1. Check b12,folate, urine analysis  2. Evaluation of iron deficiency anemia from a GI point of view involves EGD+ colonoscopy and if negative capsule study of the small bowel . The capsule study of the small bowel is usually not authorized unless an EGD+ colonoscopy has been completed within the prceeding 1 year, Unfortunately in his case has been longer and needs to be repeated to be authorized by his insurance 3. Refer to Dr Cathie Hoops in hematology for IV iron as his ferritin has not normalized despite 1 year of oral iron.    Risks, benefits, alternatives of Givens capsule discussed with patient to include but not limited to the rare risk of Given's capsule becoming lodged in the GI tract requiring surgical removal.  The patient agrees with this plan & consent will be obtained.   Follow up in 6 weeks  Dr Wyline Mood MD,MRCP(U.K)

## 2018-04-28 NOTE — Addendum Note (Signed)
Addended by: Jackquline DenmarkIDGEWAY, Selwyn Reason W on: 04/28/2018 03:02 PM   Modules accepted: Orders, SmartSet

## 2018-04-29 ENCOUNTER — Other Ambulatory Visit: Payer: Self-pay

## 2018-04-29 DIAGNOSIS — D509 Iron deficiency anemia, unspecified: Secondary | ICD-10-CM

## 2018-04-29 NOTE — Addendum Note (Signed)
Addended by: Jackquline DenmarkIDGEWAY, Valleri Hendricksen W on: 04/29/2018 08:45 AM   Modules accepted: Orders

## 2018-05-12 ENCOUNTER — Inpatient Hospital Stay: Payer: Self-pay | Attending: Oncology | Admitting: Oncology

## 2018-05-12 ENCOUNTER — Other Ambulatory Visit: Payer: Self-pay

## 2018-05-12 ENCOUNTER — Encounter: Payer: Self-pay | Admitting: Oncology

## 2018-05-12 VITALS — BP 112/70 | HR 66 | Temp 98.4°F | Resp 16 | Wt 163.5 lb

## 2018-05-12 DIAGNOSIS — D509 Iron deficiency anemia, unspecified: Secondary | ICD-10-CM

## 2018-05-12 DIAGNOSIS — D649 Anemia, unspecified: Secondary | ICD-10-CM

## 2018-05-12 DIAGNOSIS — Z808 Family history of malignant neoplasm of other organs or systems: Secondary | ICD-10-CM | POA: Insufficient documentation

## 2018-05-12 DIAGNOSIS — I10 Essential (primary) hypertension: Secondary | ICD-10-CM | POA: Insufficient documentation

## 2018-05-12 DIAGNOSIS — R011 Cardiac murmur, unspecified: Secondary | ICD-10-CM

## 2018-05-12 DIAGNOSIS — Z801 Family history of malignant neoplasm of trachea, bronchus and lung: Secondary | ICD-10-CM | POA: Insufficient documentation

## 2018-05-12 DIAGNOSIS — E119 Type 2 diabetes mellitus without complications: Secondary | ICD-10-CM | POA: Insufficient documentation

## 2018-05-12 NOTE — Progress Notes (Signed)
Hematology/Oncology Consult note [facility] [phone] [phone]   Patient Care Team: Steele Sizer, MD as PCP - General (Family Medicine)  REFERRING PROVIDER: Barbaraann Faster CHIEF COMPLAINTS/REASON FOR VISIT:  Evaluation of iron deficiency anemia  HISTORY OF PRESENTING ILLNESS:  Stanley Dickerson is a  57 y.o.  male with PMH listed below who was referred to me for evaluation of iron deficiency anemia.  Patient is referred by gastroenterology Dr. Tobi Bastos for evaluation of persistent iron deficiency despite being on oral iron supplementation for about a year. Patient's primary care physician Dr. Charise Carwin as previously referred the patient to gastroenterology back in 2017.  Patient had EGD plus colonoscopy back in 2017.  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  Patient supposed to repeat EGD in 6 months and follow-up at GI clinic but she did not follow-up.  Patient was recently seen by Dr. Tobi Bastos.  Reviewed patient's recent and previous labs.  He has a history of iron deficiency anemia dated back in 2017 when his ferritin level was decreased at 5.  He has been taking oral iron supplementation ferrous sulfate 325 twice daily for about 10 months. Recent ferritin level at 03/20/2018 showed 20 [ref 30-400]  Patient denies any melena, black tarry stool or bright red per rectum.  Since he is taking iron pills stool is black. Denies any NSAID use.  Denies any family history of colon cancer. Craving ice/freeze pops.   Fatigue: reports worsening fatigue. Chronic onset, perisistent, no aggravating or improving factors, no associated symptoms.  Self-employed does not have medical insurance.   Review of Systems  Constitutional: Positive for malaise/fatigue. Negative for chills, fever and weight loss.  HENT: Negative for nosebleeds and sore throat.   Eyes: Negative for double vision, photophobia and redness.  Respiratory: Negative for  cough, shortness of breath and wheezing.   Cardiovascular: Negative for chest pain, palpitations and orthopnea.  Gastrointestinal: Negative for abdominal pain, blood in stool, nausea and vomiting.  Genitourinary: Negative for dysuria.  Musculoskeletal: Negative for back pain, myalgias and neck pain.  Skin: Negative for itching and rash.  Neurological: Negative for dizziness, tingling and tremors.  Endo/Heme/Allergies: Negative for environmental allergies. Does not bruise/bleed easily.  Psychiatric/Behavioral: Negative for depression.    MEDICAL HISTORY:  Past Medical History:  Diagnosis Date  . Diabetes mellitus without complication (HCC)   . Hyperlipidemia   . Hypertension     SURGICAL HISTORY: Past Surgical History:  Procedure Laterality Date  . COLONOSCOPY WITH PROPOFOL N/A 08/10/2016   Procedure: COLONOSCOPY WITH PROPOFOL;  Surgeon: Wyline Mood, MD;  Location: [facility] ENDOSCOPY;  Service: Endoscopy;  Laterality: N/A;  . ESOPHAGOGASTRODUODENOSCOPY (EGD) WITH PROPOFOL N/A 08/10/2016   Procedure: ESOPHAGOGASTRODUODENOSCOPY (EGD) WITH PROPOFOL;  Surgeon: Wyline Mood, MD;  Location: [facility] ENDOSCOPY;  Service: Endoscopy;  Laterality: N/A;  . HERNIA REPAIR    . TONSILLECTOMY AND ADENOIDECTOMY      SOCIAL HISTORY: Social History   Socioeconomic History  . Marital status: Single    Spouse name: Not on file  . Number of children: Not on file  . Years of education: Not on file  . Highest education level: Not on file  Occupational History  . Occupation: Museum/gallery exhibitions officer   Social Needs  . Financial resource strain: Not on file  . Food insecurity:    Worry: Not on file    Inability: Not on file  . Transportation needs:    Medical: Not on file    Non-medical:  Not on file  Tobacco Use  . Smoking status: Never Smoker  . Smokeless tobacco: Never Used  Substance and Sexual Activity  . Alcohol use: Not Currently    Comment: rare  . Drug use: No  . Sexual activity: Not on  file  Lifestyle  . Physical activity:    Days per week: Not on file    Minutes per session: Not on file  . Stress: Not on file  Relationships  . Social connections:    Talks on phone: Not on file    Gets together: Not on file    Attends religious service: Not on file    Active member of club or organization: Not on file    Attends meetings of clubs or organizations: Not on file    Relationship status: Not on file  . Intimate partner violence:    Fear of current or ex partner: Not on file    Emotionally abused: Not on file    Physically abused: Not on file    Forced sexual activity: Not on file  Other Topics Concern  . Not on file  Social History Narrative  . Not on file    FAMILY HISTORY: Family History  Problem Relation Age of Onset  . Depression Mother   . Thyroid disease Mother   . Alzheimer's disease Mother   . Alcohol abuse Father   . Lung cancer Father   . Throat cancer Father   . Alzheimer's disease Maternal Grandmother     ALLERGIES:  has No Known Allergies.  MEDICATIONS:  Current Outpatient Medications  Medication Sig Dispense Refill  . aspirin EC 81 MG tablet Take 81 mg by mouth daily.    . benazepril (LOTENSIN) 40 MG tablet Take 1 tablet (40 mg total) by mouth daily. 90 tablet 4  . ferrous sulfate 325 (65 FE) MG tablet TAKE ONE TABLET BY MOUTH TWICE DAILY WITH A MEAL 60 tablet 12  . glyBURIDE (DIABETA) 5 MG tablet Take 1 tablet (5 mg total) by mouth daily with breakfast. 90 tablet 4  . metFORMIN (GLUCOPHAGE) 500 MG tablet Take 2 tablets (1,000 mg total) by mouth 2 (two) times daily with a meal. 360 tablet 4  . triamcinolone cream (KENALOG) 0.1 % Apply 1 application topically 2 (two) times daily. 30 g 0  . simvastatin (ZOCOR) 40 MG tablet Take 1 tablet (40 mg total) by mouth daily. (Patient not taking: Reported on 05/12/2018) 90 tablet 4   No current facility-administered medications for this visit.      PHYSICAL EXAMINATION: ECOG PERFORMANCE STATUS: 0  - Asymptomatic Vitals:   05/12/18 0958  BP: 112/70  Pulse: 66  Resp: 16  Temp: 98.4 F (36.9 C)   Filed Weights   05/12/18 0958  Weight: 163 lb 8 oz (74.2 kg)    Physical Exam  Constitutional: He is oriented to person, place, and time. He appears well-developed and well-nourished. No distress.  HENT:  Head: Normocephalic and atraumatic.  Mouth/Throat: Oropharynx is clear and moist.  Eyes: Pupils are equal, round, and reactive to light. Conjunctivae and EOM are normal. No scleral icterus.  Neck: Normal range of motion. Neck supple.  Cardiovascular: Normal rate and regular rhythm.  Murmur heard. Pulmonary/Chest: Effort normal and breath sounds normal. No respiratory distress. He has no wheezes. He has no rales. He exhibits no tenderness.  Abdominal: Soft. Bowel sounds are normal. He exhibits no distension and no mass. There is no tenderness.  Musculoskeletal: Normal range of motion. He exhibits  no edema or deformity.  Lymphadenopathy:    He has no cervical adenopathy.  Neurological: He is alert and oriented to person, place, and time. No cranial nerve deficit. Coordination normal.  Skin: Skin is warm and dry. No rash noted.  Psychiatric: He has a normal mood and affect. His behavior is normal. Thought content normal.     LABORATORY DATA:  I have reviewed the data as listed Lab Results  Component Value Date   WBC 4.6 03/20/2018   HGB 12.7 (L) 03/20/2018   HCT 39.0 03/20/2018   MCV 91 03/20/2018   PLT 261 03/20/2018   Recent Labs    05/14/17 0827 03/20/18 0847 03/20/18 1011  NA 136  --  139  K 4.5  --  4.7  CL 101  --  103  CO2 23  --  23  GLUCOSE 193*  --  93  BUN 20  --  19  CREATININE 0.60*  --  0.70*  CALCIUM 9.7  --  9.8  GFRNONAA 111  --  103  GFRAA 128  --  119  PROT 6.7  --   --   ALBUMIN 4.5  --   --   AST 19 22  --   ALT 19 25  --   ALKPHOS 53  --   --   BILITOT 0.4  --   --    Iron/TIBC/Ferritin/ %Sat    Component Value Date/Time   IRON 21  (L) 08/01/2016 0949   TIBC 409 08/01/2016 0949   FERRITIN 20 (L) 03/20/2018 1011   IRONPCTSAT 5 (LL) 08/01/2016 0949        ASSESSMENT & PLAN:  1. Iron deficiency anemia, unspecified iron deficiency anemia type   2. Anemia, unspecified type   3. Murmur, cardiac    Labs reviewed and discussed with patient.  Recent labs are more than 6 weeks ago.  I offered to repeat CBC, and repeat of a full panel of iron TIBC ferritin, will check folate and B12, UA.  Patient reports that he does not have insurance and he pays out of his pocket.  He prefers to have labs taken later.  I think this is reasonable. Plan IV iron with Venofer 200mg  weekly x 3 doses. Allergy reactions/infusion reaction including anaphylactic reaction discussed with patient. Other side effects include but not limited to high blood pressure, skin rash, weight gain, leg swelling, etc. Patient voices understanding and willing to proceed. He shall continue follow-up with gastroenterology to have EGD colonoscopy repeated.   #Murmur, patient reports that he was told that he had a murmur years ago.  Never see cardiovascular physician. At the end of the encounter, patient also mentioned that he has some intermittent chest tightness as well as left shoulder pain.  He does not have any symptoms right now.  Has not communicate with primary care physician. I discuss with him about communicating with primary care physician about his intermittent chest tightness.  Given his diabetes, he is at risk of developing cardiovascular disease.  If he experiences chest tightness/pain again, I recommend patient to seek medical advice immediately.   Patient voices understanding.  He will let PCP know.  Orders Placed This Encounter  Procedures  . CBC with Differential/Platelet    Standing Status:   Future    Standing Expiration Date:   05/13/2019  . Iron and TIBC    Standing Status:   Future    Standing Expiration Date:   05/13/2019  . Ferritin  Standing Status:   Future    Standing Expiration Date:   05/13/2019  . Folate    Standing Status:   Future    Standing Expiration Date:   05/13/2019  . Vitamin B12    Standing Status:   Future    Standing Expiration Date:   05/13/2019  . Urinalysis, Complete w Microscopic    Standing Status:   Future    Standing Expiration Date:   05/13/2019    All questions were answered. The patient knows to call the clinic with any problems questions or concerns.  Return of visit: 6 weeks with labs done prior to visit.  Thank you for this kind referral and the opportunity to participate in the care of this patient. A copy of today's note is routed to referring provider  Total face to face encounter time for this patient visit was 45 min. >50% of the time was  spent in counseling and coordination of care.    Rickard PatienceZhou Timara Loma, MD, PhD Hematology Oncology [facility] Pager- [phone] 05/12/2018

## 2018-05-12 NOTE — Progress Notes (Signed)
Patient here today as a new patient referred by GI for anemia.

## 2018-05-13 ENCOUNTER — Emergency Department
Admission: EM | Admit: 2018-05-13 | Discharge: 2018-05-13 | Disposition: A | Discharge status: Home / Self Care | Payer: Self-pay | Attending: Emergency Medicine | Admitting: Emergency Medicine

## 2018-05-13 ENCOUNTER — Telehealth: Payer: Self-pay | Admitting: Gastroenterology

## 2018-05-13 ENCOUNTER — Other Ambulatory Visit: Payer: Self-pay

## 2018-05-13 ENCOUNTER — Emergency Department: Payer: Self-pay

## 2018-05-13 ENCOUNTER — Encounter: Payer: Self-pay | Admitting: Emergency Medicine

## 2018-05-13 DIAGNOSIS — R0602 Shortness of breath: Secondary | ICD-10-CM | POA: Insufficient documentation

## 2018-05-13 DIAGNOSIS — Z7982 Long term (current) use of aspirin: Secondary | ICD-10-CM | POA: Insufficient documentation

## 2018-05-13 DIAGNOSIS — Z7984 Long term (current) use of oral hypoglycemic drugs: Secondary | ICD-10-CM | POA: Insufficient documentation

## 2018-05-13 DIAGNOSIS — E119 Type 2 diabetes mellitus without complications: Secondary | ICD-10-CM | POA: Insufficient documentation

## 2018-05-13 DIAGNOSIS — R079 Chest pain, unspecified: Secondary | ICD-10-CM | POA: Insufficient documentation

## 2018-05-13 DIAGNOSIS — Z79899 Other long term (current) drug therapy: Secondary | ICD-10-CM | POA: Insufficient documentation

## 2018-05-13 DIAGNOSIS — I10 Essential (primary) hypertension: Secondary | ICD-10-CM | POA: Insufficient documentation

## 2018-05-13 DIAGNOSIS — K2970 Gastritis, unspecified, without bleeding: Secondary | ICD-10-CM | POA: Insufficient documentation

## 2018-05-13 LAB — CBC
HEMATOCRIT: 38.7 % — AB (ref 40.0–52.0)
Hemoglobin: 13.5 g/dL (ref 13.0–18.0)
MCH: 31 pg (ref 26.0–34.0)
MCHC: 34.8 g/dL (ref 32.0–36.0)
MCV: 89 fL (ref 80.0–100.0)
PLATELETS: 249 10*3/uL (ref 150–440)
RBC: 4.35 MIL/uL — ABNORMAL LOW (ref 4.40–5.90)
RDW: 13.9 % (ref 11.5–14.5)
WBC: 5.6 10*3/uL (ref 3.8–10.6)

## 2018-05-13 LAB — BASIC METABOLIC PANEL
Anion gap: 6 (ref 5–15)
BUN: 20 mg/dL (ref 6–20)
CO2: 27 mmol/L (ref 22–32)
CREATININE: 0.64 mg/dL (ref 0.61–1.24)
Calcium: 10 mg/dL (ref 8.9–10.3)
Chloride: 104 mmol/L (ref 98–111)
GFR calc Af Amer: >60 mL/min (ref >60)
GLUCOSE: 106 mg/dL — AB (ref 70–99)
Potassium: 4.5 mmol/L (ref 3.5–5.1)
SODIUM: 137 mmol/L (ref 135–145)

## 2018-05-13 LAB — TROPONIN I: Troponin I: <0.03 ng/mL (ref <0.03)

## 2018-05-13 MED ORDER — SUCRALFATE 1 G PO TABS: 1.0000 g | ORAL_TABLET | Freq: Four times a day (QID) | ORAL | 0 refills | Status: DC

## 2018-05-13 MED ORDER — GI COCKTAIL ~~LOC~~
30.0000 mL | Freq: Once | ORAL | Status: AC
  Administered 2018-05-13: 30 mL via ORAL
  Filled 2018-05-13: qty 30

## 2018-05-13 MED ORDER — FAMOTIDINE 40 MG PO TABS: 40.0000 mg | ORAL_TABLET | Freq: Every evening | ORAL | 1 refills | Status: DC

## 2018-05-13 NOTE — Discharge Instructions (Addendum)
Please seek medical attention for any high fevers, chest pain, shortness of breath, change in behavior, persistent vomiting, bloody stool or any other new or concerning symptoms.  

## 2018-05-13 NOTE — Telephone Encounter (Signed)
Patient contacted office to reschedule capsule study from 07/31 to 05/29/18.  Trish in Endo has been informed.  Thanks [employer]le

## 2018-05-13 NOTE — Telephone Encounter (Signed)
Pt left vm to r/s his capsule study for 05/14/18

## 2018-05-13 NOTE — ED Triage Notes (Addendum)
Patient reports intermittent left arm and chest pain since this weekend, worsening today. Patient reports he was diagnosed with anemia at Christmas and had consultation with Dr. Tobi BastosAnna yesterday. Reports nausea and SOB this morning which have resolved now.

## 2018-05-13 NOTE — ED Provider Notes (Signed)
[facility] Emergency Department Provider Note   ____________________________________________   I have reviewed the triage vital signs and the nursing notes.   HISTORY  Chief Complaint Chest pain  History limited by: Not Limited   HPI Stanley Dickerson is a 57 y.o. male who presents to the emergency department today because of concerns for chest pain.  Patient states that is located in his left chest.  States he has been having some issues with pain in his left arm for the past 2 days.  He thought it was related to the way he was sleeping.  Today however at work he started developing some left chest pressure.  Located in the left side.  Continues to have some pressure at the time my examination.  The patient states that he had similar discomfort a few months ago and had an EKG done by primary care.    Per medical record review patient has a history of DM, HTN, HLD.  Past Medical History:  Diagnosis Date  . Diabetes mellitus without complication (HCC)   . Hyperlipidemia   . Hypertension     Patient Active Problem List   Diagnosis Date Noted  . Bilateral lower extremity edema 11/20/2017  . 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 Lower extremity pain, bilateral 10/22/2017  . Iron deficiency anemia   . Vertigo 10/20/2015  . Hypertension 03/30/2015  . Diabetes mellitus without complication (HCC) 03/30/2015  . Hyperlipidemia 03/30/2015  . Anxiety 03/30/2015    Past Surgical History:  Procedure Laterality Date  . COLONOSCOPY WITH PROPOFOL N/A 08/10/2016   Procedure: COLONOSCOPY WITH PROPOFOL;  Surgeon: Wyline MoodKiran Anna, MD;  Location: [facility] ENDOSCOPY;  Service: Endoscopy;  Laterality: N/A;  . ESOPHAGOGASTRODUODENOSCOPY (EGD) WITH PROPOFOL N/A 08/10/2016   Procedure: ESOPHAGOGASTRODUODENOSCOPY (EGD) WITH PROPOFOL;  Surgeon: Wyline MoodKiran Anna, MD;  Location: [facility] ENDOSCOPY;  Service: Endoscopy;  Laterality: N/A;  .  HERNIA REPAIR    . TONSILLECTOMY AND ADENOIDECTOMY      Prior to Admission medications   Medication Sig Start Date End Date Taking? Authorizing Provider  aspirin EC 81 MG tablet Take 81 mg by mouth daily.    [provider]  benazepril (LOTENSIN) 40 MG tablet Take 1 tablet (40 mg total) by mouth daily. 05/14/17   Steele Sizerrissman, Mark A, MD  ferrous sulfate 325 (65 FE) MG tablet TAKE ONE TABLET BY MOUTH TWICE DAILY WITH A MEAL 09/02/17   Steele Sizerrissman, Mark A, MD  glyBURIDE (DIABETA) 5 MG tablet Take 1 tablet (5 mg total) by mouth daily with breakfast. 05/14/17   Steele Sizerrissman, Mark A, MD  metFORMIN (GLUCOPHAGE) 500 MG tablet Take 2 tablets (1,000 mg total) by mouth 2 (two) times daily with a meal. 05/14/17   Crissman, Redge GainerMark A, MD  simvastatin (ZOCOR) 40 MG tablet Take 1 tablet (40 mg total) by mouth daily. Patient not taking: Reported on 05/12/2018 05/14/17   Steele Sizerrissman, Mark A, MD  triamcinolone cream (KENALOG) 0.1 % Apply 1 application topically 2 (two) times daily. 01/17/17   Steele Sizerrissman, Mark A, MD    Allergies Patient has no known allergies.  Family History  Problem Relation Age of Onset  . Depression Mother   . Thyroid disease Mother   . Alzheimer's disease Mother   . Alcohol abuse Father   . Lung cancer Father   . Throat cancer Father   . Alzheimer's disease Maternal Grandmother     Social History Social History   Tobacco Use  . Smoking status: Never Smoker  .  Smokeless tobacco: Never Used  Substance Use Topics  . Alcohol use: Not Currently    Comment: rare  . Drug use: No    Review of Systems Constitutional: No fever/chills Eyes: No visual changes. ENT: No sore throat. Cardiovascular: Positive chest pain. Respiratory: Denies shortness of breath. Gastrointestinal: No abdominal pain.  No nausea, no vomiting.  No diarrhea.   Genitourinary: Negative for dysuria. Musculoskeletal: Positive for left arm pain Skin: Negative for rash. Neurological: Negative for headaches, focal  weakness or numbness.  ____________________________________________   PHYSICAL EXAM:  VITAL SIGNS: ED Triage Vitals  Enc Vitals Group     BP 05/13/18 1350 (!) 168/92     Pulse Rate 05/13/18 1350 86     Resp 05/13/18 1350 16     Temp 05/13/18 1350 98.4 F (36.9 C)     Temp Source 05/13/18 1350 Oral     SpO2 05/13/18 1350 99 %     Weight 05/13/18 1351 163 lb (73.9 kg)     Height 05/13/18 1351 5\' 10"  (1.778 m)     Head Circumference --      Peak Flow --      Pain Score 05/13/18 1351 4   Constitutional: Alert and oriented.  Eyes: Conjunctivae are normal.  ENT      Head: Normocephalic and atraumatic.      Nose: No congestion/rhinnorhea.      Mouth/Throat: Mucous membranes are moist.      Neck: No stridor. Hematological/Lymphatic/Immunilogical: No cervical lymphadenopathy. Cardiovascular: Normal rate, regular rhythm. Positive systolic murmur.  Respiratory: Normal respiratory effort without tachypnea nor retractions. Breath sounds are clear and equal bilaterally. No wheezes/rales/rhonchi. Gastrointestinal: Soft and non tender. No rebound. No guarding.  Genitourinary: Deferred Musculoskeletal: Normal range of motion in all extremities. No lower extremity edema. Neurologic:  Normal speech and language. No gross focal neurologic deficits are appreciated.  Skin:  Skin is warm, dry and intact. No rash noted. Psychiatric: Mood and affect are normal. Speech and behavior are normal. Patient exhibits appropriate insight and judgment.  ____________________________________________    LABS (pertinent positives/negatives)  BMP wnl except glu 106 CBC wbc 5.6, hgb 13.5, plt 249 Trop <0.03  ____________________________________________   EKG   I, Phineas Semen, attending physician, personally viewed and interpreted this EKG  EKG Time: 1355 Rate: 69 Rhythm: normal sinus rhythm Axis: normal Intervals: qtc 385 QRS: narrow, LVH ST changes: no st elevation Impression: normal  ekg   ____________________________________________    RADIOLOGY  CXR No acute disease  ____________________________________________   PROCEDURES  Procedures  ____________________________________________   INITIAL IMPRESSION / ASSESSMENT AND PLAN / ED COURSE  Pertinent labs & imaging results that were available during my care of the patient were reviewed by me and considered in my medical decision making (see chart for details).   Presented to the emergency department today because of concerns for chest pain.  Pain located in the left chest.  Troponin EKG and chest x-ray without concerning findings.  Patient was given GI cocktail which did help alleviate some of his symptoms of chest pressure.  At this point I do think it gastritis and esophagitis possibility of the patient's pain.  Discussed cardiology and pcp follow up. Will give prescription for antacid and sucralfate.  ____________________________________________   FINAL CLINICAL IMPRESSION(S) / ED DIAGNOSES  Final diagnoses:  Nonspecific chest pain  Gastritis, presence of bleeding unspecified, unspecified chronicity, unspecified gastritis type     Note: This dictation was prepared with Dragon dictation. Any transcriptional errors that  result from this process are unintentional     Phineas Semen, MD 05/13/18 1900

## 2018-05-13 NOTE — ED Notes (Signed)

## 2018-05-21 ENCOUNTER — Inpatient Hospital Stay: Payer: Self-pay

## 2018-05-27 ENCOUNTER — Encounter: Payer: Self-pay | Admitting: Family Medicine

## 2018-05-28 ENCOUNTER — Inpatient Hospital Stay: Payer: Self-pay

## 2018-06-04 ENCOUNTER — Inpatient Hospital Stay: Payer: Self-pay | Attending: Oncology

## 2018-06-04 VITALS — BP 126/73 | HR 68 | Temp 97.0°F | Resp 18

## 2018-06-04 DIAGNOSIS — D509 Iron deficiency anemia, unspecified: Secondary | ICD-10-CM | POA: Insufficient documentation

## 2018-06-04 MED ORDER — IRON SUCROSE 20 MG/ML IV SOLN
200.0000 mg | Freq: Once | INTRAVENOUS | Status: AC
  Administered 2018-06-04: 200 mg via INTRAVENOUS
  Filled 2018-06-04: qty 10

## 2018-06-04 MED ORDER — SODIUM CHLORIDE 0.9 % IV SOLN: 200.0000 mg | Freq: Once | INTRAVENOUS | Status: DC

## 2018-06-04 MED ORDER — SODIUM CHLORIDE 0.9 % IV SOLN
Freq: Once | INTRAVENOUS | Status: AC
  Administered 2018-06-04: 14:00:00 via INTRAVENOUS
  Filled 2018-06-04: qty 250

## 2018-06-11 ENCOUNTER — Telehealth: Payer: Self-pay | Admitting: Pharmacy Technician

## 2018-06-11 ENCOUNTER — Inpatient Hospital Stay: Payer: Self-pay

## 2018-06-11 VITALS — BP 100/62 | HR 68 | Temp 98.0°F | Resp 20

## 2018-06-11 DIAGNOSIS — D509 Iron deficiency anemia, unspecified: Secondary | ICD-10-CM

## 2018-06-11 MED ORDER — IRON SUCROSE 20 MG/ML IV SOLN
200.0000 mg | Freq: Once | INTRAVENOUS | Status: AC
  Administered 2018-06-11: 200 mg via INTRAVENOUS
  Filled 2018-06-11: qty 10

## 2018-06-11 MED ORDER — SODIUM CHLORIDE 0.9 % IV SOLN: 200.0000 mg | Freq: Once | INTRAVENOUS | Status: DC

## 2018-06-11 MED ORDER — SODIUM CHLORIDE 0.9 % IV SOLN
Freq: Once | INTRAVENOUS | Status: AC
Start: 2018-06-11 — End: 2018-06-11
  Administered 2018-06-11: 14:00:00 via INTRAVENOUS
  Filled 2018-06-11: qty 250

## 2018-06-11 NOTE — Telephone Encounter (Signed)
Left patient message that [employer]Venof[employer] application is incomplete. They are needing 2018 tax information for verification purposes.  Miking Usrey Rosezella FloridaPeyton CPhT, CSPT [facility]  [facility]ist [facility]  Direct Dial: [phone]  Fax: [phone]    06/11/2018 12:07 PM

## 2018-06-12 ENCOUNTER — Other Ambulatory Visit: Payer: Self-pay | Admitting: Oncology

## 2018-06-12 ENCOUNTER — Other Ambulatory Visit: Payer: Self-pay | Admitting: Family Medicine

## 2018-06-12 DIAGNOSIS — I10 Essential (primary) hypertension: Secondary | ICD-10-CM

## 2018-06-12 DIAGNOSIS — E119 Type 2 diabetes mellitus without complications: Secondary | ICD-10-CM

## 2018-06-13 ENCOUNTER — Telehealth: Payer: Self-pay | Admitting: Gastroenterology

## 2018-06-13 NOTE — Telephone Encounter (Signed)
Patient has rescheduled his capsule study to 07/03/18.

## 2018-06-13 NOTE — Telephone Encounter (Signed)
PT IS CALLING TO R/S CAPSULE STUDY 06/17/18 HE STATES HE HAS ROOT [employer] FOLLOWING DAY AND ANOTHER APT ASWELL

## 2018-06-18 ENCOUNTER — Inpatient Hospital Stay: Payer: Self-pay | Attending: Oncology

## 2018-06-18 ENCOUNTER — Telehealth: Payer: Self-pay | Admitting: Pharmacy Technician

## 2018-06-18 VITALS — BP 125/69 | HR 65 | Temp 97.4°F | Resp 20

## 2018-06-18 DIAGNOSIS — D509 Iron deficiency anemia, unspecified: Secondary | ICD-10-CM | POA: Insufficient documentation

## 2018-06-18 MED ORDER — IRON SUCROSE 20 MG/ML IV SOLN
200.0000 mg | Freq: Once | INTRAVENOUS | Status: AC
  Administered 2018-06-18: 200 mg via INTRAVENOUS
  Filled 2018-06-18: qty 10

## 2018-06-18 MED ORDER — SODIUM CHLORIDE 0.9 % IV SOLN: 200.0000 mg | Freq: Once | INTRAVENOUS | Status: DC

## 2018-06-18 MED ORDER — SODIUM CHLORIDE 0.9 % IV SOLN
Freq: Once | INTRAVENOUS | Status: AC
  Administered 2018-06-18: 14:00:00 via INTRAVENOUS
  Filled 2018-06-18: qty 250

## 2018-06-18 NOTE — Telephone Encounter (Signed)
Spoke to patient in the infusion clinic, he plans to bring tax information before Friday 9/6

## 2018-07-03 ENCOUNTER — Ambulatory Visit
Admission: RE | Admit: 2018-07-03 | Discharge: 2018-07-03 | Disposition: A | Discharge status: Home / Self Care | Payer: Self-pay | Source: Ambulatory Visit | Attending: Gastroenterology | Admitting: Gastroenterology

## 2018-07-03 ENCOUNTER — Encounter
Admission: RE | Disposition: A | Discharge status: Home / Self Care | Payer: Self-pay | Source: Ambulatory Visit | Attending: Gastroenterology

## 2018-07-03 DIAGNOSIS — K5521 Angiodysplasia of colon with hemorrhage: Secondary | ICD-10-CM | POA: Insufficient documentation

## 2018-07-03 DIAGNOSIS — D509 Iron deficiency anemia, unspecified: Secondary | ICD-10-CM | POA: Insufficient documentation

## 2018-07-03 HISTORY — PX: GIVENS CAPSULE STUDY: SHX5432

## 2018-07-03 SURGERY — IMAGING PROCEDURE, GI TRACT, INTRALUMINAL, VIA CAPSULE

## 2018-07-06 ENCOUNTER — Encounter: Payer: Self-pay | Admitting: Gastroenterology

## 2018-07-09 ENCOUNTER — Telehealth: Payer: Self-pay

## 2018-07-09 NOTE — Telephone Encounter (Signed)
Spoke with pt regarding capsule study result and informed him of Dr. Johnney KillianAnna's instructions to schedule balloon endoscopy. Pt states he would first like to know the estimated cost of this procedure, as he does not have insurance. Pt also stated he has had four bowel movements since the capsule study but has seen no evidence of passing the capsule. I informed pt that I will consult with Dr. Tobi BastosAnna and management about his concerns.

## 2018-07-09 NOTE — Telephone Encounter (Signed)
-----   Message from Wyline MoodKiran Anna, MD sent at 07/08/2018  3:45 PM EDT ----- Regarding: procedure [name]   I have read his capsule study and it shows he has areas that are bleeding in the small intestine.   I need to perform a special endoscopy called baloon endoscopy which is similar to his EGD but a little different.   I am C/c Penny our endo manager on this message to help coordinate the time for the procedure as we will need the company rep to be present to assist with the equipment . Inform the patient if he wishes to discuss further, I can see him at the office if not schedule the procdure by coordinating with Boyd Kerbsenny   He needs a repeat CBC to ensure his Hb is stable  [address]

## 2018-07-14 ENCOUNTER — Inpatient Hospital Stay: Payer: Self-pay

## 2018-07-14 DIAGNOSIS — D649 Anemia, unspecified: Secondary | ICD-10-CM

## 2018-07-14 LAB — CBC WITH DIFFERENTIAL/PLATELET
BASOS ABS: 0.1 10*3/uL (ref 0–0.1)
BASOS PCT: 1 %
EOS ABS: 0.4 10*3/uL (ref 0–0.7)
EOS PCT: 7 %
HCT: 38.8 % — ABNORMAL LOW (ref 40.0–52.0)
Hemoglobin: 13.2 g/dL (ref 13.0–18.0)
Lymphocytes Relative: 24 %
Lymphs Abs: 1.4 10*3/uL (ref 1.0–3.6)
MCH: 30.4 pg (ref 26.0–34.0)
MCHC: 33.9 g/dL (ref 32.0–36.0)
MCV: 89.7 fL (ref 80.0–100.0)
MONO ABS: 0.5 10*3/uL (ref 0.2–1.0)
Monocytes Relative: 8 %
Neutro Abs: 3.6 10*3/uL (ref 1.4–6.5)
Neutrophils Relative %: 60 %
PLATELETS: 257 10*3/uL (ref 150–440)
RBC: 4.33 MIL/uL — AB (ref 4.40–5.90)
RDW: 14.7 % — AB (ref 11.5–14.5)
WBC: 6 10*3/uL (ref 3.8–10.6)

## 2018-07-14 LAB — IRON AND TIBC
IRON: 57 ug/dL (ref 45–182)
SATURATION RATIOS: 16 % — AB (ref 17.9–39.5)
TIBC: 363 ug/dL (ref 250–450)
UIBC: 306 ug/dL

## 2018-07-14 LAB — FOLATE: FOLATE: 21.8 ng/mL (ref >5.9)

## 2018-07-14 LAB — FERRITIN: FERRITIN: 34 ng/mL (ref 24–336)

## 2018-07-14 LAB — VITAMIN B12: VITAMIN B 12: 447 pg/mL (ref 180–914)

## 2018-07-15 ENCOUNTER — Inpatient Hospital Stay: Payer: Self-pay

## 2018-07-15 ENCOUNTER — Encounter: Payer: Self-pay | Admitting: Oncology

## 2018-07-15 ENCOUNTER — Other Ambulatory Visit: Payer: Self-pay

## 2018-07-15 ENCOUNTER — Inpatient Hospital Stay: Payer: Self-pay | Attending: Oncology | Admitting: Oncology

## 2018-07-15 VITALS — BP 137/75 | HR 68 | Temp 98.9°F | Resp 18 | Wt 167.0 lb

## 2018-07-15 DIAGNOSIS — D509 Iron deficiency anemia, unspecified: Secondary | ICD-10-CM | POA: Insufficient documentation

## 2018-07-15 DIAGNOSIS — Z79899 Other long term (current) drug therapy: Secondary | ICD-10-CM | POA: Insufficient documentation

## 2018-07-15 DIAGNOSIS — K5520 Angiodysplasia of colon without hemorrhage: Secondary | ICD-10-CM | POA: Insufficient documentation

## 2018-07-15 DIAGNOSIS — K219 Gastro-esophageal reflux disease without esophagitis: Secondary | ICD-10-CM | POA: Insufficient documentation

## 2018-07-15 DIAGNOSIS — R011 Cardiac murmur, unspecified: Secondary | ICD-10-CM | POA: Insufficient documentation

## 2018-07-15 MED ORDER — FAMOTIDINE 40 MG PO TABS: 40.0000 mg | ORAL_TABLET | Freq: Every evening | ORAL | 0 refills | Status: DC

## 2018-07-15 MED ORDER — SODIUM CHLORIDE 0.9 % IV SOLN: 200.0000 mg | Freq: Once | INTRAVENOUS | Status: DC

## 2018-07-15 MED ORDER — SODIUM CHLORIDE 0.9 % IV SOLN
Freq: Once | INTRAVENOUS | Status: AC
  Administered 2018-07-15: 11:00:00 via INTRAVENOUS
  Filled 2018-07-15: qty 250

## 2018-07-15 MED ORDER — IRON SUCROSE 20 MG/ML IV SOLN
200.0000 mg | Freq: Once | INTRAVENOUS | Status: AC
  Administered 2018-07-15: 200 mg via INTRAVENOUS
  Filled 2018-07-15: qty 10

## 2018-07-15 MED ORDER — SUCRALFATE 1 G PO TABS: 1.0000 g | ORAL_TABLET | Freq: Two times a day (BID) | ORAL | 0 refills | Status: DC

## 2018-07-15 NOTE — Telephone Encounter (Signed)
Called pt regarding pt questions and concerns about scheduling the enteroscopy procedure. LVM to return call

## 2018-07-15 NOTE — Progress Notes (Signed)
Patient here for follow up

## 2018-07-15 NOTE — Progress Notes (Signed)
Hematology/Oncology follow up note [facility] [phone] [phone]   Patient Care Team: Steele Sizer, MD as PCP - General (Family Medicine)  REFERRING PROVIDER: Dr.Anna REASON FOR VISIT Follow up for treatment of iron deficiency anemia  HISTORY OF PRESENTING ILLNESS:  Stanley Dickerson is a  57 y.o.  male with PMH listed below who was referred to me for evaluation of iron deficiency anemia.  Patient is referred by gastroenterology Dr. Tobi Bastos for evaluation of persistent iron deficiency despite being on oral iron supplementation for about a year. Patient's primary care physician Dr. Charise Carwin as previously referred the patient to gastroenterology back in 2017.  Patient had EGD plus colonoscopy back in 2017.  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  Patient supposed to repeat EGD in 6 months and follow-up at GI clinic but she did not follow-up.  Patient was recently seen by Dr. Tobi Bastos.  Reviewed patient's recent and previous labs.  He has a history of iron deficiency anemia dated back in 2017 when his ferritin level was decreased at 5.  He has been taking oral iron supplementation ferrous sulfate 325 twice daily for about 10 months. Recent ferritin level at 03/20/2018 showed 20 [ref 30-400]  Patient denies any melena, black tarry stool or bright red per rectum.  Since he is taking iron pills stool is black. Denies any NSAID use.  Denies any family history of colon cancer. Craving ice/freeze pops.   Fatigue: reports worsening fatigue. Chronic onset, perisistent, no aggravating or improving factors, no associated symptoms.  Self-employed does not have medical insurance.   INTERVAL HISTORY Stanley Dickerson is a 57 y.o. male who has above history reviewed by me today presents for follow up visit for management of IDA Reports fatigue is much better, however still not back to baseline.  During the interval, patient has had  gastroenterology work-up.  Capsule study showed multiple nonbleeding and bleeding AVMs seen in the proximal small bowel.  Dr. Tobi Bastos plans to do a single balloon anterograde endoscopy to ablate the AVMs.   Review of Systems  Constitutional: Positive for malaise/fatigue. Negative for chills, fever and weight loss.  HENT: Negative for nosebleeds and sore throat.   Eyes: Negative for double vision, photophobia and redness.  Respiratory: Negative for cough, shortness of breath and wheezing.   Cardiovascular: Negative for chest pain, palpitations and orthopnea.  Gastrointestinal: Negative for abdominal pain, blood in stool, nausea and vomiting.  Genitourinary: Negative for dysuria.  Musculoskeletal: Negative for back pain, myalgias and neck pain.  Skin: Negative for itching and rash.  Neurological: Negative for dizziness, tingling and tremors.  Endo/Heme/Allergies: Negative for environmental allergies. Does not bruise/bleed easily.  Psychiatric/Behavioral: Negative for depression.    MEDICAL HISTORY:  Past Medical History:  Diagnosis Date  . Diabetes mellitus without complication (HCC)   . Hyperlipidemia   . Hypertension     SURGICAL HISTORY: Past Surgical History:  Procedure Laterality Date  . COLONOSCOPY WITH PROPOFOL N/A 08/10/2016   Procedure: COLONOSCOPY WITH PROPOFOL;  Surgeon: Wyline Mood, MD;  Location: [facility] ENDOSCOPY;  Service: Endoscopy;  Laterality: N/A;  . ESOPHAGOGASTRODUODENOSCOPY (EGD) WITH PROPOFOL N/A 08/10/2016   Procedure: ESOPHAGOGASTRODUODENOSCOPY (EGD) WITH PROPOFOL;  Surgeon: Wyline Mood, MD;  Location: [facility] ENDOSCOPY;  Service: Endoscopy;  Laterality: N/A;  . GIVENS CAPSULE STUDY N/A 07/03/2018   Procedure: GIVENS CAPSULE STUDY;  Surgeon: Wyline Mood, MD;  Location: [facility] ENDOSCOPY;  Service: Gastroenterology;  Laterality: N/A;  . HERNIA REPAIR    .  TONSILLECTOMY AND ADENOIDECTOMY      SOCIAL HISTORY: Social History   Socioeconomic History  . Marital status:  Single    Spouse name: Not on file  . Number of children: Not on file  . Years of education: Not on file  . Highest education level: Not on file  Occupational History  . Occupation: Museum/gallery exhibitions officer   Social Needs  . Financial resource strain: Not on file  . Food insecurity:    Worry: Not on file    Inability: Not on file  . Transportation needs:    Medical: Not on file    Non-medical: Not on file  Tobacco Use  . Smoking status: Never Smoker  . Smokeless tobacco: Never Used  Substance and Sexual Activity  . Alcohol use: Not Currently    Comment: rare  . Drug use: No  . Sexual activity: Not on file  Lifestyle  . Physical activity:    Days per week: Not on file    Minutes per session: Not on file  . Stress: Not on file  Relationships  . Social connections:    Talks on phone: Not on file    Gets together: Not on file    Attends religious service: Not on file    Active member of club or organization: Not on file    Attends meetings of clubs or organizations: Not on file    Relationship status: Not on file  . Intimate partner violence:    Fear of current or ex partner: Not on file    Emotionally abused: Not on file    Physically abused: Not on file    Forced sexual activity: Not on file  Other Topics Concern  . Not on file  Social History Narrative  . Not on file    FAMILY HISTORY: Family History  Problem Relation Age of Onset  . Depression Mother   . Thyroid disease Mother   . Alzheimer's disease Mother   . Alcohol abuse Father   . Lung cancer Father   . Throat cancer Father   . Alzheimer's disease Maternal Grandmother     ALLERGIES:  has No Known Allergies.  MEDICATIONS:  Current Outpatient Medications  Medication Sig Dispense Refill  . aspirin EC 81 MG tablet Take 81 mg by mouth daily.    . benazepril (LOTENSIN) 40 MG tablet TAKE 1 TABLET BY MOUTH ONCE DAILY 90 tablet 0  . ferrous sulfate 325 (65 FE) MG tablet TAKE ONE TABLET BY MOUTH TWICE  DAILY WITH A MEAL 60 tablet 12  . glyBURIDE (DIABETA) 5 MG tablet TAKE 1 TABLET BY MOUTH ONCE DAILY WITH  BREAKFAST 90 tablet 0  . metFORMIN (GLUCOPHAGE) 500 MG tablet TAKE 2 TABLETS BY MOUTH TWICE DAILY WITH FOOD 360 tablet 0  . famotidine (PEPCID) 40 MG tablet Take 1 tablet (40 mg total) by mouth every evening. 30 tablet 0  . simvastatin (ZOCOR) 40 MG tablet Take 1 tablet (40 mg total) by mouth daily. (Patient not taking: Reported on 05/12/2018) 90 tablet 4  . sucralfate (CARAFATE) 1 g tablet Take 1 tablet (1 g total) by mouth 2 (two) times daily. 60 tablet 0  . triamcinolone cream (KENALOG) 0.1 % Apply 1 application topically 2 (two) times daily. (Patient not taking: Reported on 07/15/2018) 30 g 0   No current facility-administered medications for this visit.      PHYSICAL EXAMINATION: ECOG PERFORMANCE STATUS: 0 - Asymptomatic Vitals:   07/15/18 1014  BP: 137/75  Pulse: 68  Resp: 18  Temp: 98.9 F (37.2 C)   Filed Weights   07/15/18 1014  Weight: 167 lb (75.8 kg)    Physical Exam  Constitutional: He is oriented to person, place, and time. He appears well-developed and well-nourished. No distress.  HENT:  Head: Normocephalic and atraumatic.  Mouth/Throat: Oropharynx is clear and moist.  Eyes: Pupils are equal, round, and reactive to light. Conjunctivae and EOM are normal. No scleral icterus.  Neck: Normal range of motion. Neck supple.  Cardiovascular: Normal rate and regular rhythm.  Murmur heard. Pulmonary/Chest: Effort normal and breath sounds normal. No respiratory distress. He has no wheezes. He has no rales. He exhibits no tenderness.  Abdominal: Soft. Bowel sounds are normal. He exhibits no distension and no mass. There is no tenderness.  Musculoskeletal: Normal range of motion. He exhibits no edema or deformity.  Lymphadenopathy:    He has no cervical adenopathy.  Neurological: He is alert and oriented to person, place, and time. No cranial nerve deficit. Coordination  normal.  Skin: Skin is warm and dry. No rash noted. No erythema.  Psychiatric: He has a normal mood and affect. His behavior is normal. Thought content normal.     LABORATORY DATA:  I have reviewed the data as listed Lab Results  Component Value Date   WBC 6.0 07/14/2018   HGB 13.2 07/14/2018   HCT 38.8 (L) 07/14/2018   MCV 89.7 07/14/2018   PLT 257 07/14/2018   Recent Labs    03/20/18 0847 03/20/18 1011 05/13/18 1406  NA  --  139 137  K  --  4.7 4.5  CL  --  103 104  CO2  --  23 27  GLUCOSE  --  93 106*  BUN  --  19 20  CREATININE  --  0.70* 0.64  CALCIUM  --  9.8 10.0  GFRNONAA  --  103 >60  GFRAA  --  119 >60  AST 22  --   --   ALT 25  --   --    Iron/TIBC/Ferritin/ %Sat    Component Value Date/Time   IRON 57 07/14/2018 1108   IRON 21 (L) 08/01/2016 0949   TIBC 363 07/14/2018 1108   TIBC 409 08/01/2016 0949   FERRITIN 34 07/14/2018 1108   FERRITIN 20 (L) 03/20/2018 1011   IRONPCTSAT 16 (L) 07/14/2018 1108   IRONPCTSAT 5 (LL) 08/01/2016 0949        ASSESSMENT & PLAN:  1. Iron deficiency anemia, unspecified iron deficiency anemia type   2. Murmur, cardiac   3. AVM (arteriovenous malformation) of small bowel, acquired    Labs reviewed and discussed with patient.   He is s/p IV iron.  Repeat iron panel showed improved ferritin to 35, still borderline low iron saturation, consistent with ongoing blood loss which leading to suboptimized increase of his iron store. He still feels mild fatigue, will proceed with one more Venofer 200mg  IV x 1 today.   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  Follow up with gastroenterology for small bowel AVM ablation.  patient reports that he was told that he had a murmur years ago.  Never see cardiovascular physician. Self employed, no insurance and has a lot of medical bills already. I urge patient to follow up with his primary care physician. He says he has an appointment  coming up.   # Chest tightness, he went to ED for evaluation. He had  a negative cardiac work up including negative troponin and non remarkable EKG,negative EKG. Chest tightness was considered to be due to acid reflux  He was given famotidine and Carafate and reports symptoms have improved. He asks if he can get refills of these medication. I told patient that I will refill his medication for one month supply and he will get additional supplies from his PCP.    Orders Placed This Encounter  Procedures  . CBC with Differential/Platelet    Standing Status:   Future    Standing Expiration Date:   07/15/2019  . Ferritin    Standing Status:   Future    Standing Expiration Date:   07/16/2019  . Iron and TIBC    Standing Status:   Future    Standing Expiration Date:   07/16/2019    All questions were answered. The patient knows to call the clinic with any problems questions or concerns.  Return of visit: 3 months Total face to face encounter time for this patient visit was 25 min. >50% of the time was  spent in counseling and coordination of care.    Rickard Patience, MD, PhD Hematology Oncology [facility] Pager- [phone] 07/15/2018

## 2018-07-31 ENCOUNTER — Encounter: Payer: Self-pay | Admitting: Family Medicine

## 2018-07-31 ENCOUNTER — Ambulatory Visit (INDEPENDENT_AMBULATORY_CARE_PROVIDER_SITE_OTHER): Payer: Self-pay | Admitting: Family Medicine

## 2018-07-31 VITALS — BP 119/60 | HR 75 | Ht 69.0 in | Wt 168.0 lb

## 2018-07-31 DIAGNOSIS — Z1159 Encounter for screening for other viral diseases: Secondary | ICD-10-CM

## 2018-07-31 DIAGNOSIS — Z125 Encounter for screening for malignant neoplasm of prostate: Secondary | ICD-10-CM

## 2018-07-31 DIAGNOSIS — Z0000 Encounter for general adult medical examination without abnormal findings: Secondary | ICD-10-CM

## 2018-07-31 DIAGNOSIS — E119 Type 2 diabetes mellitus without complications: Secondary | ICD-10-CM

## 2018-07-31 DIAGNOSIS — Z1329 Encounter for screening for other suspected endocrine disorder: Secondary | ICD-10-CM

## 2018-07-31 DIAGNOSIS — I10 Essential (primary) hypertension: Secondary | ICD-10-CM

## 2018-07-31 DIAGNOSIS — D500 Iron deficiency anemia secondary to blood loss (chronic): Secondary | ICD-10-CM

## 2018-07-31 DIAGNOSIS — E785 Hyperlipidemia, unspecified: Secondary | ICD-10-CM

## 2018-07-31 DIAGNOSIS — E7800 Pure hypercholesterolemia, unspecified: Secondary | ICD-10-CM

## 2018-07-31 LAB — URINALYSIS, ROUTINE W REFLEX MICROSCOPIC
BILIRUBIN UA: NEGATIVE
GLUCOSE, UA: NEGATIVE
Ketones, UA: NEGATIVE
Leukocytes, UA: NEGATIVE
Nitrite, UA: NEGATIVE
Protein, UA: NEGATIVE
RBC UA: NEGATIVE
Specific Gravity, UA: 1.025 (ref 1.005–1.030)
UUROB: 0.2 mg/dL (ref 0.2–1.0)
pH, UA: 5.5 (ref 5.0–7.5)

## 2018-07-31 LAB — BAYER DCA HB A1C WAIVED: HB A1C (BAYER DCA - WAIVED): 6.1 % (ref <7.0)

## 2018-07-31 MED ORDER — SIMVASTATIN 40 MG PO TABS: 40.0000 mg | ORAL_TABLET | Freq: Every day | ORAL | 4 refills | Status: DC

## 2018-07-31 MED ORDER — FAMOTIDINE 40 MG PO TABS: 40.0000 mg | ORAL_TABLET | Freq: Every evening | ORAL | 4 refills | Status: DC

## 2018-07-31 MED ORDER — METFORMIN HCL 500 MG PO TABS: 1000.0000 mg | ORAL_TABLET | Freq: Two times a day (BID) | ORAL | 4 refills | Status: DC

## 2018-07-31 MED ORDER — GLYBURIDE 5 MG PO TABS: 5.0000 mg | ORAL_TABLET | Freq: Every day | ORAL | 4 refills | Status: DC

## 2018-07-31 MED ORDER — BENAZEPRIL HCL 40 MG PO TABS: 40.0000 mg | ORAL_TABLET | Freq: Every day | ORAL | 4 refills | Status: DC

## 2018-07-31 NOTE — Assessment & Plan Note (Signed)
The current medical regimen is effective;  continue present plan and medications.  

## 2018-07-31 NOTE — Progress Notes (Signed)
BP 119/60   Pulse 75   Ht 5\' 9"  (1.753 m)   Wt 168 lb (76.2 kg)   SpO2 97%   BMI 24.81 kg/m    Subjective:    Patient ID: Stanley Dickerson, male    DOB: Jun 18, 1961, 57 y.o.   MRN: [id number]  HPI: Stanley Dickerson is a 57 y.o. male  Chief Complaint  Patient presents with  . Annual Exam  Patient all in all doing well working with cancer center on iron infusions for chronic lower intestinal GI bleed.  Patient's on review anemia has been resolved.  Ferritin is still low normal but is improving. Patient has stopped simvastatin to see if it makes any difference in his leg symptoms has not really noticed much. Patient's diabetes has been doing well no low blood sugar spells. Takes Pepcid for stomach on occasional and doing okay.  This is been recommended by GI.  Relevant past medical, surgical, family and social history reviewed and updated as indicated. Interim medical history since our last visit reviewed. Allergies and medications reviewed and updated.  Review of Systems  Constitutional: Negative.   HENT: Negative.   Eyes: Negative.   Respiratory: Negative.   Cardiovascular: Negative.   Gastrointestinal: Negative.   Endocrine: Negative.   Genitourinary: Negative.   Musculoskeletal: Negative.   Skin: Negative.   Allergic/Immunologic: Negative.   Neurological: Negative.   Hematological: Negative.   Psychiatric/Behavioral: Negative.     Per HPI unless specifically indicated above     Objective:    BP 119/60   Pulse 75   Ht 5\' 9"  (1.753 m)   Wt 168 lb (76.2 kg)   SpO2 97%   BMI 24.81 kg/m   Wt Readings from Last 3 Encounters:  07/31/18 168 lb (76.2 kg)  07/15/18 167 lb (75.8 kg)  05/13/18 163 lb (73.9 kg)    Physical Exam  Constitutional: He is oriented to person, place, and time. He appears well-developed and well-nourished.  HENT:  Head: Normocephalic and atraumatic.  Right Ear: External ear normal.  Left Ear: External ear normal.  Eyes: Pupils are  equal, round, and reactive to light. Conjunctivae and EOM are normal.  Neck: Normal range of motion. Neck supple.  Cardiovascular: Normal rate, regular rhythm, normal heart sounds and intact distal pulses.  Pulmonary/Chest: Effort normal and breath sounds normal.  Abdominal: Soft. Bowel sounds are normal. There is no splenomegaly or hepatomegaly.  Genitourinary: Rectum normal, prostate normal and penis normal.  Musculoskeletal: Normal range of motion.  Neurological: He is alert and oriented to person, place, and time. He has normal reflexes.  Skin: No rash noted. No erythema.  Psychiatric: He has a normal mood and affect. His behavior is normal. Judgment and thought content normal.    Results for orders placed or performed in visit on 07/14/18  Folate  Result Value Ref Range   Folate 21.8 >5.9 ng/mL  CBC with Differential/Platelet  Result Value Ref Range   WBC 6.0 3.8 - 10.6 K/uL   RBC 4.33 (L) 4.40 - 5.90 MIL/uL   Hemoglobin 13.2 13.0 - 18.0 g/dL   HCT 40.1 (L) 02.7 - 25.3 %   MCV 89.7 80.0 - 100.0 fL   MCH 30.4 26.0 - 34.0 pg   MCHC 33.9 32.0 - 36.0 g/dL   RDW 66.4 (H) 40.3 - 47.4 %   Platelets 257 150 - 440 K/uL   Neutrophils Relative % 60 %   Neutro Abs 3.6 1.4 - 6.5 K/uL   Lymphocytes Relative  24 %   Lymphs Abs 1.4 1.0 - 3.6 K/uL   Monocytes Relative 8 %   Monocytes Absolute 0.5 0.2 - 1.0 K/uL   Eosinophils Relative 7 %   Eosinophils Absolute 0.4 0 - 0.7 K/uL   Basophils Relative 1 %   Basophils Absolute 0.1 0 - 0.1 K/uL  Iron and TIBC  Result Value Ref Range   Iron 57 45 - 182 ug/dL   TIBC 324 401 - 027 ug/dL   Saturation Ratios 16 (L) 17.9 - 39.5 %   UIBC 306 ug/dL  Ferritin  Result Value Ref Range   Ferritin 34 24 - 336 ng/mL  Vitamin B12  Result Value Ref Range   Vitamin B-12 447 180 - 914 pg/mL      Assessment & Plan:   Problem List Items Addressed This Visit      Cardiovascular and Mediastinum   Hypertension - Primary (Chronic)    The current  medical regimen is effective;  continue present plan and medications.       Relevant Medications   benazepril (LOTENSIN) 40 MG tablet   simvastatin (ZOCOR) 40 MG tablet   Other Relevant Orders   CBC with Differential/Platelet   Comprehensive metabolic panel   Lipid panel   Urinalysis, Routine w reflex microscopic   Bayer DCA Hb A1c Waived     Endocrine   Diabetes mellitus without complication (HCC) (Chronic)    The current medical regimen is effective;  continue present plan and medications.       Relevant Medications   metFORMIN (GLUCOPHAGE) 500 MG tablet   glyBURIDE (DIABETA) 5 MG tablet   benazepril (LOTENSIN) 40 MG tablet   simvastatin (ZOCOR) 40 MG tablet   Other Relevant Orders   CBC with Differential/Platelet   Comprehensive metabolic panel   Lipid panel   Urinalysis, Routine w reflex microscopic   Bayer DCA Hb A1c Waived     Other   Hyperlipidemia (Chronic)    The current medical regimen is effective;  continue present plan and medications.       Relevant Medications   benazepril (LOTENSIN) 40 MG tablet   simvastatin (ZOCOR) 40 MG tablet   Other Relevant Orders   CBC with Differential/Platelet   Comprehensive metabolic panel   Lipid panel   Urinalysis, Routine w reflex microscopic   Bayer DCA Hb A1c Waived   Iron deficiency anemia    Followed by hematology       Other Visit Diagnoses    Annual physical exam       Thyroid disorder screen       Relevant Orders   TSH   Prostate cancer screening       Relevant Orders   PSA   Need for hepatitis C screening test       Relevant Orders   Hepatitis C Antibody       Follow up plan: Return in about 6 months (around 01/30/2019) for Hemoglobin A1c, BMP,  Lipids, ALT, AST.

## 2018-07-31 NOTE — Assessment & Plan Note (Signed)
Followed by hematology 

## 2018-08-01 LAB — CBC WITH DIFFERENTIAL/PLATELET
Basophils Absolute: 0.1 10*3/uL (ref 0.0–0.2)
Basos: 1 %
EOS (ABSOLUTE): 0.2 10*3/uL (ref 0.0–0.4)
EOS: 5 %
HEMATOCRIT: 36.6 % — AB (ref 37.5–51.0)
HEMOGLOBIN: 12.1 g/dL — AB (ref 13.0–17.7)
Immature Grans (Abs): 0 10*3/uL (ref 0.0–0.1)
Immature Granulocytes: 1 %
LYMPHS ABS: 1.2 10*3/uL (ref 0.7–3.1)
Lymphs: 25 %
MCH: 29.7 pg (ref 26.6–33.0)
MCHC: 33.1 g/dL (ref 31.5–35.7)
MCV: 90 fL (ref 79–97)
MONOCYTES: 8 %
MONOS ABS: 0.4 10*3/uL (ref 0.1–0.9)
Neutrophils Absolute: 2.8 10*3/uL (ref 1.4–7.0)
Neutrophils: 60 %
Platelets: 250 10*3/uL (ref 150–450)
RBC: 4.07 x10E6/uL — AB (ref 4.14–5.80)
RDW: 14.3 % (ref 12.3–15.4)
WBC: 4.7 10*3/uL (ref 3.4–10.8)

## 2018-08-01 LAB — LIPID PANEL
CHOL/HDL RATIO: 4.4 ratio (ref 0.0–5.0)
Cholesterol, Total: 185 mg/dL (ref 100–199)
HDL: 42 mg/dL (ref >39)
LDL Calculated: 106 mg/dL — ABNORMAL HIGH (ref 0–99)
TRIGLYCERIDES: 184 mg/dL — AB (ref 0–149)
VLDL Cholesterol Cal: 37 mg/dL (ref 5–40)

## 2018-08-01 LAB — TSH: TSH: 1.23 u[IU]/mL (ref 0.450–4.500)

## 2018-08-01 LAB — HEPATITIS C ANTIBODY

## 2018-08-01 LAB — COMPREHENSIVE METABOLIC PANEL
ALT: 19 IU/L (ref 0–44)
AST: 16 IU/L (ref 0–40)
Albumin/Globulin Ratio: 2.1 (ref 1.2–2.2)
Albumin: 4.5 g/dL (ref 3.5–5.5)
Alkaline Phosphatase: 60 IU/L (ref 39–117)
BUN/Creatinine Ratio: 34 — ABNORMAL HIGH (ref 9–20)
BUN: 23 mg/dL (ref 6–24)
Bilirubin Total: 0.4 mg/dL (ref 0.0–1.2)
CHLORIDE: 102 mmol/L (ref 96–106)
CO2: 24 mmol/L (ref 20–29)
Calcium: 9.9 mg/dL (ref 8.7–10.2)
Creatinine, Ser: 0.68 mg/dL — ABNORMAL LOW (ref 0.76–1.27)
GFR, EST AFRICAN AMERICAN: 121 mL/min/{1.73_m2} (ref >59)
GFR, EST NON AFRICAN AMERICAN: 105 mL/min/{1.73_m2} (ref >59)
Globulin, Total: 2.1 g/dL (ref 1.5–4.5)
Glucose: 226 mg/dL — ABNORMAL HIGH (ref 65–99)
Potassium: 4.5 mmol/L (ref 3.5–5.2)
SODIUM: 140 mmol/L (ref 134–144)
Total Protein: 6.6 g/dL (ref 6.0–8.5)

## 2018-08-01 LAB — PSA: PROSTATE SPECIFIC AG, SERUM: 1 ng/mL (ref 0.0–4.0)

## 2018-08-06 NOTE — Telephone Encounter (Signed)
Phone call Discussed with patient blood work basically normal hemoglobin slightly low patient still receiving iron therapy and will continue.

## 2018-08-08 ENCOUNTER — Telehealth: Payer: Self-pay

## 2018-08-08 NOTE — Telephone Encounter (Signed)
-----   Message from Kiran Anna, MD sent at 07/08/2018  3:45 PM EDT ----- Regarding: procedure Stanley Dickerson   I have read his capsule study and it shows he has areas that are bleeding in the small intestine.   I need to perform a special endoscopy called baloon endoscopy which is similar to his EGD but a little different.   I am C/c Penny our endo manager on this message to help coordinate the time for the procedure as we will need the company rep to be present to assist with the equipment . Inform the patient if he wishes to discuss further, I can see him at the office if not schedule the procdure by coordinating with Penny   He needs a repeat CBC to ensure his Hb is stable  Kiran  

## 2018-08-08 NOTE — Telephone Encounter (Signed)
Called pt to discuss his decision on the small bowel enteroscopy procedure. Pt was concerned about the cost of the procedure. I explained I would find out about billing and then inform. I've tried contacting pt to inform, pt has not returned call.

## 2018-09-05 ENCOUNTER — Other Ambulatory Visit: Payer: Self-pay | Admitting: Oncology

## 2018-09-30 NOTE — Telephone Encounter (Signed)
Called pt regarding scheduling enteroscopy procedure or possibly referring [facility] as they have a pt financing program. Unable to contact via phone. I've mailed pt letter.

## 2018-10-09 ENCOUNTER — Other Ambulatory Visit: Payer: Self-pay | Admitting: Family Medicine

## 2018-10-20 ENCOUNTER — Inpatient Hospital Stay: Payer: Self-pay

## 2018-10-21 ENCOUNTER — Other Ambulatory Visit: Payer: Self-pay | Admitting: Oncology

## 2018-10-22 ENCOUNTER — Inpatient Hospital Stay: Payer: Self-pay

## 2018-10-22 ENCOUNTER — Inpatient Hospital Stay: Payer: Self-pay | Attending: Oncology | Admitting: Oncology

## 2018-10-23 ENCOUNTER — Ambulatory Visit: Payer: Self-pay | Admitting: Family Medicine

## 2018-10-23 ENCOUNTER — Encounter: Payer: Self-pay | Admitting: Family Medicine

## 2018-10-23 VITALS — BP 94/59 | HR 88 | Temp 98.5°F | Ht 71.0 in | Wt 168.1 lb

## 2018-10-23 DIAGNOSIS — J069 Acute upper respiratory infection, unspecified: Secondary | ICD-10-CM

## 2018-10-23 DIAGNOSIS — K219 Gastro-esophageal reflux disease without esophagitis: Secondary | ICD-10-CM | POA: Insufficient documentation

## 2018-10-23 DIAGNOSIS — R42 Dizziness and giddiness: Secondary | ICD-10-CM

## 2018-10-23 MED ORDER — SUCRALFATE 1 G PO TABS: 1.0000 g | ORAL_TABLET | Freq: Two times a day (BID) | ORAL | 3 refills | Status: DC

## 2018-10-23 MED ORDER — PREDNISONE 50 MG PO TABS: 50.0000 mg | ORAL_TABLET | Freq: Every day | ORAL | 0 refills | Status: DC

## 2018-10-23 MED ORDER — HYDROCOD POLST-CPM POLST ER 10-8 MG/5ML PO SUER: 5.0000 mL | Freq: Every evening | ORAL | 0 refills | Status: DC | PRN

## 2018-10-23 NOTE — Progress Notes (Signed)
BP (!) 94/59 (BP Location: Left Arm, Patient Position: Sitting, Cuff Size: Normal)   Pulse 88   Temp 98.5 F (36.9 C) (Oral)   Ht 5\' 11"  (1.803 m)   Wt 168 lb 1.6 oz (76.2 kg)   SpO2 97%   BMI 23.45 kg/m    Subjective:    Patient ID: Stanley Dickerson, male    DOB: 19-Jan-1961, 58 y.o.   MRN: [id number]  HPI: Stanley Dickerson is a 58 y.o. male  Chief Complaint  Patient presents with  . Epistaxis    Has been taking OTC cough medicine, and sinus medication. Passed out this morning, and patient thinks he took too much of the OTC medicine.  . Nasal Congestion  . Cough  . Sinusitis  . Gastroesophageal Reflux   Got a little dizzy this morning and sat down. Has been taking alkaseltzer cold medicine. He did not pass out. Has been feeling OK since eating something in the office.   UPPER RESPIRATORY TRACT INFECTION Duration: 4-5 days Worst symptom: fatigue, cough Fever: yes (102) Cough: yes Shortness of breath: no Wheezing: no Chest pain: no Chest tightness: no Chest congestion: no Nasal congestion: yes Runny nose: yes Post nasal drip: yes Sneezing: yes Sore throat: yes Swollen glands: no Sinus pressure: yes Headache: yes Face pain: yes Toothache: yes Ear pain: no  Ear pressure: no  Eyes red/itching:no Eye drainage/crusting: no  Vomiting: no Rash: no Fatigue: yes Sick contacts: yes Strep contacts: no  Context: worse Recurrent sinusitis: no Relief with OTC cold/cough medications: no  Treatments attempted: cold/sinus, anti-histamine and pseudoephedrine   Relevant past medical, surgical, family and social history reviewed and updated as indicated. Interim medical history since our last visit reviewed. Allergies and medications reviewed and updated.  Review of Systems  Constitutional: Positive for fatigue and fever. Negative for activity change, appetite change, chills, diaphoresis and unexpected weight change.  HENT: Positive for congestion, postnasal drip,  rhinorrhea, sinus pressure, sinus pain, sneezing and sore throat. Negative for dental problem, drooling, ear discharge, ear pain, facial swelling, hearing loss, mouth sores, nosebleeds, tinnitus, trouble swallowing and voice change.   Respiratory: Positive for cough and shortness of breath. Negative for apnea, choking, chest tightness, wheezing and stridor.   Cardiovascular: Negative.   Skin: Negative.   Psychiatric/Behavioral: Negative.    Per HPI unless specifically indicated above     Objective:    BP (!) 94/59 (BP Location: Left Arm, Patient Position: Sitting, Cuff Size: Normal)   Pulse 88   Temp 98.5 F (36.9 C) (Oral)   Ht 5\' 11"  (1.803 m)   Wt 168 lb 1.6 oz (76.2 kg)   SpO2 97%   BMI 23.45 kg/m   Wt Readings from Last 3 Encounters:  10/23/18 168 lb 1.6 oz (76.2 kg)  07/31/18 168 lb (76.2 kg)  07/15/18 167 lb (75.8 kg)    Orthostatic VS for the past 24 hrs:  BP- Lying Pulse- Lying BP- Sitting Pulse- Sitting BP- Standing at 0 minutes Pulse- Standing at 0 minutes  10/23/18 0925 124/64 88 127/63 92 129/68 101   Physical Exam Vitals signs and nursing note reviewed.  Constitutional:      General: He is not in acute distress.    Appearance: Normal appearance. He is not ill-appearing, toxic-appearing or diaphoretic.  HENT:     Head: Normocephalic and atraumatic.     Right Ear: Tympanic membrane, ear canal and external ear normal. There is no impacted cerumen.     Left Ear: Tympanic  membrane, ear canal and external ear normal. There is no impacted cerumen.     Nose: Congestion and rhinorrhea present.     Mouth/Throat:     Mouth: Mucous membranes are moist.     Pharynx: Oropharynx is clear. No oropharyngeal exudate or posterior oropharyngeal erythema.  Eyes:     General: No scleral icterus.       Right eye: No discharge.        Left eye: No discharge.     Extraocular Movements: Extraocular movements intact.     Conjunctiva/sclera: Conjunctivae normal.     Pupils: Pupils  are equal, round, and reactive to light.  Neck:     Musculoskeletal: Normal range of motion and neck supple. No neck rigidity or muscular tenderness.     Vascular: No carotid bruit.  Cardiovascular:     Rate and Rhythm: Normal rate and regular rhythm.     Pulses: Normal pulses.     Heart sounds: Normal heart sounds. No murmur. No friction rub. No gallop.   Pulmonary:     Effort: Pulmonary effort is normal. No respiratory distress.     Breath sounds: Normal breath sounds. No stridor. No wheezing, rhonchi or rales.  Chest:     Chest wall: No tenderness.  Musculoskeletal: Normal range of motion.  Lymphadenopathy:     Cervical: Cervical adenopathy present.  Skin:    General: Skin is warm and dry.     Capillary Refill: Capillary refill takes less than 2 seconds.     Coloration: Skin is not jaundiced or pale.     Findings: No bruising, erythema, lesion or rash.  Neurological:     General: No focal deficit present.     Mental Status: He is alert and oriented to person, place, and time. Mental status is at baseline.     Cranial Nerves: No cranial nerve deficit.     Sensory: No sensory deficit.     Motor: No weakness.     Coordination: Coordination normal.     Gait: Gait normal.     Deep Tendon Reflexes: Reflexes normal.  Psychiatric:        Mood and Affect: Mood normal.        Behavior: Behavior normal.        Thought Content: Thought content normal.        Judgment: Judgment normal.     Results for orders placed or performed in visit on 07/31/18  CBC with Differential/Platelet  Result Value Ref Range   WBC 4.7 3.4 - 10.8 x10E3/uL   RBC 4.07 (L) 4.14 - 5.80 x10E6/uL   Hemoglobin 12.1 (L) 13.0 - 17.7 g/dL   Hematocrit 27.0 (L) 35.0 - 51.0 %   MCV 90 79 - 97 fL   MCH 29.7 26.6 - 33.0 pg   MCHC 33.1 31.5 - 35.7 g/dL   RDW 09.3 81.8 - 29.9 %   Platelets 250 150 - 450 x10E3/uL   Neutrophils 60 Not Estab. %   Lymphs 25 Not Estab. %   Monocytes 8 Not Estab. %   Eos 5 Not Estab.  %   Basos 1 Not Estab. %   Neutrophils Absolute 2.8 1.4 - 7.0 x10E3/uL   Lymphocytes Absolute 1.2 0.7 - 3.1 x10E3/uL   Monocytes Absolute 0.4 0.1 - 0.9 x10E3/uL   EOS (ABSOLUTE) 0.2 0.0 - 0.4 x10E3/uL   Basophils Absolute 0.1 0.0 - 0.2 x10E3/uL   Immature Granulocytes 1 Not Estab. %   Immature Grans (Abs) 0.0 0.0 -  0.1 x10E3/uL  Comprehensive metabolic panel  Result Value Ref Range   Glucose 226 (H) 65 - 99 mg/dL   BUN 23 6 - 24 mg/dL   Creatinine, Ser 1.610.68 (L) 0.76 - 1.27 mg/dL   GFR calc non Af Amer 105 >59 mL/min/1.73   GFR calc Af Amer 121 >59 mL/min/1.73   BUN/Creatinine Ratio 34 (H) 9 - 20   Sodium 140 134 - 144 mmol/L   Potassium 4.5 3.5 - 5.2 mmol/L   Chloride 102 96 - 106 mmol/L   CO2 24 20 - 29 mmol/L   Calcium 9.9 8.7 - 10.2 mg/dL   Total Protein 6.6 6.0 - 8.5 g/dL   Albumin 4.5 3.5 - 5.5 g/dL   Globulin, Total 2.1 1.5 - 4.5 g/dL   Albumin/Globulin Ratio 2.1 1.2 - 2.2   Bilirubin Total 0.4 0.0 - 1.2 mg/dL   Alkaline Phosphatase 60 39 - 117 IU/L   AST 16 0 - 40 IU/L   ALT 19 0 - 44 IU/L  Lipid panel  Result Value Ref Range   Cholesterol, Total 185 100 - 199 mg/dL   Triglycerides 096184 (H) 0 - 149 mg/dL   HDL 42 >04>39 mg/dL   VLDL Cholesterol Cal 37 5 - 40 mg/dL   LDL Calculated 540106 (H) 0 - 99 mg/dL   Chol/HDL Ratio 4.4 0.0 - 5.0 ratio  PSA  Result Value Ref Range   Prostate Specific Ag, Serum 1.0 0.0 - 4.0 ng/mL  TSH  Result Value Ref Range   TSH 1.230 0.450 - 4.500 uIU/mL  Urinalysis, Routine w reflex microscopic  Result Value Ref Range   Specific Gravity, UA 1.025 1.005 - 1.030   pH, UA 5.5 5.0 - 7.5   Color, UA Yellow Yellow   Appearance Ur Clear Clear   Leukocytes, UA Negative Negative   Protein, UA Negative Negative/Trace   Glucose, UA Negative Negative   Ketones, UA Negative Negative   RBC, UA Negative Negative   Bilirubin, UA Negative Negative   Urobilinogen, Ur 0.2 0.2 - 1.0 mg/dL   Nitrite, UA Negative Negative  Bayer DCA Hb A1c Waived  Result  Value Ref Range   HB A1C (BAYER DCA - WAIVED) 6.1 <7.0 %  Hepatitis C Antibody  Result Value Ref Range   Hep C Virus Ab <0.1 0.0 - 0.9 s/co ratio      Assessment & Plan:   Problem List Items Addressed This Visit      Digestive   GERD (gastroesophageal reflux disease)    Would like refills on his carafate. Refills given today. Call with any concerns.       Relevant Medications   sucralfate (CARAFATE) 1 g tablet    Other Visit Diagnoses    Upper respiratory tract infection, unspecified type    -  Primary   No sign of bacterial infection. Will treat for comfort with tussionex and prednisone. Call with any concerns. If not better by Mon, we'll send abx.   Dizziness       Feeling better after eating and drinking something. Not orthostatic. Refuses EKG. Feeling better. Call with any concerns.        Follow up plan: Return As scheduled.

## 2018-10-23 NOTE — Assessment & Plan Note (Signed)
Would like refills on his carafate. Refills given today. Call with any concerns.

## 2018-10-27 ENCOUNTER — Other Ambulatory Visit: Payer: Self-pay

## 2018-10-29 ENCOUNTER — Telehealth: Payer: Self-pay | Admitting: Family Medicine

## 2018-10-29 MED ORDER — AMOXICILLIN-POT CLAVULANATE 875-125 MG PO TABS: 1.0000 | ORAL_TABLET | Freq: Two times a day (BID) | ORAL | 0 refills | Status: DC

## 2018-10-29 NOTE — Telephone Encounter (Signed)
Copied from CRM [id number]. Topic: General - Other >> Oct 29, 2018 11:12 AM Jilda Roche wrote: Reason for CRM: Patient was in to see Dr. Laural Benes on 10/23/18 she stated that if he was not any better by Monday or Tuesday to let her know and she would call in an antibiotic for him, please advise.  Best call back is [phone]  [facility] [address] [place] - [address] [phone] (Phone) [phone] (Fax)

## 2018-10-29 NOTE — Telephone Encounter (Signed)
Called and left patient a VM letting him know that antibiotic was sent in for him

## 2018-10-29 NOTE — Telephone Encounter (Signed)
Rx sent to his pharmacy

## 2018-11-03 ENCOUNTER — Inpatient Hospital Stay: Payer: Self-pay

## 2018-11-05 ENCOUNTER — Ambulatory Visit: Payer: Self-pay | Admitting: Oncology

## 2018-11-05 ENCOUNTER — Ambulatory Visit: Payer: Self-pay

## 2018-12-31 ENCOUNTER — Telehealth: Payer: Self-pay | Admitting: Family Medicine

## 2018-12-31 NOTE — Telephone Encounter (Signed)
Unfortunately, if he hasn't had a fever, traveled, or been exposed to a lab confirmed positive, he still does not qualify for screening. He is welcome to come in to discuss this. FYI to [place].

## 2018-12-31 NOTE — Telephone Encounter (Signed)
Called and spoke to patient. He states he called in yesterday afternoon and reported that he had a runny nose for 2.5 days and dry cough for the past week. States the cough seems to be in his throat like acid reflux, not coming from his chest. Patient states his mother was admitted to [facility] 12/10/18 where he was for 4 days until she passed on 2019-01-08. States she passed away with pneumonia. Patient states they were not asked to mask up or anything while they were there. 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 Patient was upset because he states he gave this information yesterday afternoon and states that it was not taken down. Patient also states that he was not offered triage.

## 2018-12-31 NOTE — Telephone Encounter (Signed)
Called patient back and let him know what Dr. Laural Benes said. 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 Patient verbalized understanding of Dr. Henriette Combs message.

## 2018-12-31 NOTE — Telephone Encounter (Signed)
Copied from CRM [id number]. Topic: General - Inquiry >> Dec 30, 2018  4:40 PM Terisa Starr wrote: Reason for CRM: patient called and said that he needs Dr Dossie Arbour to call him. Patient refused to give further information. >> Dec 31, 2018  8:48 AM Adela Ports M wrote: Sherron Monday with pt and he stated that his mom passed away 2 weeks ago with pneumonia and he wanted to be tested for Covid 19. 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 Pt would not talk to triage and did not want to come in and be seen.

## 2018-12-31 NOTE — Telephone Encounter (Signed)
If he does not have any symptoms, he does not qualify to be screened for the virus. I would advise him to watch for symptoms and call if he has any fever, cough, runny nose, body aches.

## 2019-01-18 ENCOUNTER — Other Ambulatory Visit: Payer: Self-pay | Admitting: Family Medicine

## 2019-02-04 ENCOUNTER — Ambulatory Visit: Payer: Self-pay | Admitting: Family Medicine

## 2019-02-04 ENCOUNTER — Other Ambulatory Visit: Payer: Self-pay

## 2019-03-24 ENCOUNTER — Other Ambulatory Visit: Payer: Self-pay | Admitting: Family Medicine

## 2019-07-16 ENCOUNTER — Other Ambulatory Visit: Payer: Self-pay | Admitting: Family Medicine

## 2019-07-16 NOTE — Telephone Encounter (Signed)
Routing to provider  

## 2019-07-16 NOTE — Telephone Encounter (Signed)
Requested medication (s) are due for refill today: yes  Requested medication (s) are on the active medication list: yes  Last refill:  03/31/2019  Future visit scheduled: no  Notes to clinic:  Review for refill   Requested Prescriptions  Pending Prescriptions Disp Refills   ferrous sulfate 325 (65 FE) MG EC tablet [Pharmacy Med Name: Ferrous Sulfate 325 (65 Fe) MG Oral Tablet Delayed Release] 180 tablet 0    Sig: TAKE 1 TABLET BY MOUTH TWICE DAILY WITH MEALS     Endocrinology:  Minerals - Iron Supplementation Failed - 07/16/2019  5:50 AM      Failed - HGB in normal range and within 360 days    Hemoglobin  Date Value Ref Range Status  07/31/2018 12.1 (L) 13.0 - 17.7 g/dL Final         Failed - HCT in normal range and within 360 days    Hematocrit  Date Value Ref Range Status  07/31/2018 36.6 (L) 37.5 - 51.0 % Final         Failed - RBC in normal range and within 360 days    RBC  Date Value Ref Range Status  07/31/2018 4.07 (L) 4.14 - 5.80 x10E6/uL Final  07/14/2018 4.33 (L) 4.40 - 5.90 MIL/uL Final         Failed - Fe (serum) in normal range and within 360 days    Iron  Date Value Ref Range Status  07/14/2018 57 45 - 182 ug/dL Final  08/01/2016 21 (L) 38 - 169 ug/dL Final   Iron Saturation  Date Value Ref Range Status  08/01/2016 5 (LL) 15 - 55 % Final   Saturation Ratios  Date Value Ref Range Status  07/14/2018 16 (L) 17.9 - 39.5 % Final         Failed - Ferritin in normal range and within 360 days    Ferritin  Date Value Ref Range Status  07/14/2018 34 24 - 336 ng/mL Final    Comment:    Performed at [facility], [address] [place], [place] [address]  03/20/2018 20 (L) 30 - 400 ng/mL Final         Passed - Valid encounter within last 12 months    Recent Outpatient Visits          8 months ago Upper respiratory tract infection, unspecified type   [facility] [place], Megan P, DO   11 months ago Essential hypertension   [place], Jeannette How, MD   1 year ago Essential hypertension   [facility] [place], Jeannette How, [place]   1 year ago Diabetes mellitus without complication [facility])   [place], Jeannette How, MD   2 years ago Annual physical exam   [facility] [place], Jeannette How, MD

## 2019-11-02 ENCOUNTER — Telehealth: Payer: Self-pay

## 2019-11-02 DIAGNOSIS — E785 Hyperlipidemia, unspecified: Secondary | ICD-10-CM

## 2019-11-02 DIAGNOSIS — E119 Type 2 diabetes mellitus without complications: Secondary | ICD-10-CM

## 2019-11-02 DIAGNOSIS — I10 Essential (primary) hypertension: Secondary | ICD-10-CM

## 2019-11-02 NOTE — Telephone Encounter (Signed)
Last seen 12/31/2018 with Dr. Laural Benes.

## 2019-11-02 NOTE — Telephone Encounter (Signed)
Needs appointment

## 2019-11-03 MED ORDER — SIMVASTATIN 40 MG PO TABS: 40.0000 mg | ORAL_TABLET | Freq: Every day | ORAL | 0 refills | Status: DC

## 2019-11-03 MED ORDER — FAMOTIDINE 40 MG PO TABS: 40.0000 mg | ORAL_TABLET | Freq: Every evening | ORAL | 0 refills | Status: DC

## 2019-11-03 MED ORDER — METFORMIN HCL 500 MG PO TABS: 1000.0000 mg | ORAL_TABLET | Freq: Two times a day (BID) | ORAL | 0 refills | Status: DC

## 2019-11-03 MED ORDER — GLYBURIDE 5 MG PO TABS: 5.0000 mg | ORAL_TABLET | Freq: Every day | ORAL | 0 refills | Status: DC

## 2019-11-03 MED ORDER — BENAZEPRIL HCL 40 MG PO TABS: 40.0000 mg | ORAL_TABLET | Freq: Every day | ORAL | 0 refills | Status: DC

## 2019-11-03 NOTE — Telephone Encounter (Signed)
Called and spoke with patient. Pt stated he does not have enough medication to get to 1/25

## 2019-11-03 NOTE — Telephone Encounter (Signed)
Pt scheduled for 11/09/2019.

## 2019-11-03 NOTE — Addendum Note (Signed)
Addended by: Dorcas Carrow on: 11/03/2019 09:48 AM   Modules accepted: Orders

## 2019-11-03 NOTE — Telephone Encounter (Signed)
Maintenance meds sent to his pharmacy to last him until his appointment.

## 2019-11-03 NOTE — Telephone Encounter (Signed)
Can we make sure he has enough to get to 1/25?

## 2019-11-09 ENCOUNTER — Ambulatory Visit (INDEPENDENT_AMBULATORY_CARE_PROVIDER_SITE_OTHER): Payer: Self-pay | Admitting: Family Medicine

## 2019-11-09 ENCOUNTER — Encounter: Payer: Self-pay | Admitting: Family Medicine

## 2019-11-09 ENCOUNTER — Other Ambulatory Visit: Payer: Self-pay

## 2019-11-09 VITALS — BP 163/78 | HR 75 | Temp 98.7°F | Ht 71.0 in | Wt 169.0 lb

## 2019-11-09 DIAGNOSIS — E785 Hyperlipidemia, unspecified: Secondary | ICD-10-CM

## 2019-11-09 DIAGNOSIS — E1151 Type 2 diabetes mellitus with diabetic peripheral angiopathy without gangrene: Secondary | ICD-10-CM

## 2019-11-09 DIAGNOSIS — Z23 Encounter for immunization: Secondary | ICD-10-CM

## 2019-11-09 DIAGNOSIS — Z125 Encounter for screening for malignant neoplasm of prostate: Secondary | ICD-10-CM

## 2019-11-09 DIAGNOSIS — D500 Iron deficiency anemia secondary to blood loss (chronic): Secondary | ICD-10-CM

## 2019-11-09 DIAGNOSIS — I10 Essential (primary) hypertension: Secondary | ICD-10-CM

## 2019-11-09 DIAGNOSIS — I70219 Atherosclerosis of native arteries of extremities with intermittent claudication, unspecified extremity: Secondary | ICD-10-CM

## 2019-11-09 LAB — UA/M W/RFLX CULTURE, ROUTINE
Bilirubin, UA: NEGATIVE
Glucose, UA: NEGATIVE
Ketones, UA: NEGATIVE
Leukocytes,UA: NEGATIVE
Nitrite, UA: NEGATIVE
Protein,UA: NEGATIVE
RBC, UA: NEGATIVE
Specific Gravity, UA: 1.025 (ref 1.005–1.030)
Urobilinogen, Ur: 0.2 mg/dL (ref 0.2–1.0)
pH, UA: 5 (ref 5.0–7.5)

## 2019-11-09 LAB — MICROALBUMIN, URINE WAIVED
Creatinine, Urine Waived: 50 mg/dL (ref 10–300)
Microalb, Ur Waived: 10 mg/L (ref 0–19)
Microalb/Creat Ratio: <30 mg/g (ref <30)

## 2019-11-09 LAB — BAYER DCA HB A1C WAIVED: HB A1C (BAYER DCA - WAIVED): 6.4 % (ref <7.0)

## 2019-11-09 MED ORDER — BENAZEPRIL HCL 40 MG PO TABS: 40.0000 mg | ORAL_TABLET | Freq: Every day | ORAL | 1 refills | Status: DC

## 2019-11-09 MED ORDER — ATORVASTATIN CALCIUM 40 MG PO TABS: ORAL_TABLET | ORAL | 0 refills | Status: DC

## 2019-11-09 MED ORDER — METFORMIN HCL 500 MG PO TABS: 1000.0000 mg | ORAL_TABLET | Freq: Two times a day (BID) | ORAL | 1 refills | Status: DC

## 2019-11-09 MED ORDER — GLYBURIDE 5 MG PO TABS: 5.0000 mg | ORAL_TABLET | Freq: Every day | ORAL | 1 refills | Status: DC

## 2019-11-09 NOTE — Assessment & Plan Note (Signed)
Side effects on the simvastatin. Will change to atorvastatin 2x a week. Call with any concerns.

## 2019-11-09 NOTE — Assessment & Plan Note (Signed)
Will keep sugars, BP, cholesterol under good control. Follow up with vascular as needed. Call with any concerns.

## 2019-11-09 NOTE — Assessment & Plan Note (Signed)
Stable with A1c of 6.4- continue current regimen. Continue to monitor. Call with any concerns.

## 2019-11-09 NOTE — Patient Instructions (Signed)
To schedule a COVID test, please  text "COVID" to [phone], OR you can log on to [email or web address] to easily make an on-line appointment. If you do not have access to a smart phone or PC, you can call [phone] to get assistance.  

## 2019-11-09 NOTE — Assessment & Plan Note (Signed)
Has not been taking his medicine. Will refill and he will stop in for BP check. Continue to monitor. Call with any concerns. Labs drawn today.

## 2019-11-09 NOTE — Progress Notes (Signed)
BP (!) 163/78   Pulse 75   Temp 98.7 F (37.1 C) (Oral)   Ht 5\' 11"  (1.803 m)   Wt 169 lb (76.7 kg)   SpO2 97%   BMI 23.57 kg/m    Subjective:    Patient ID: Stanley Dickerson, male    DOB: 09-19-1961, 60 y.o.   MRN: [id number]  HPI: Stanley Dickerson is a 59 y.o. male  Chief Complaint  Patient presents with  . Diabetes  . Hypertension  . Hyperlipidemia   HYPERTENSION / HYPERLIPIDEMIA- has not been taking his blood pressure medicine for 2 days Satisfied with current treatment? no Duration of hypertension: chronic BP monitoring frequency: not checking BP medication side effects: yes Past BP meds: benazepril Duration of hyperlipidemia: chronic Cholesterol medication side effects: yes Cholesterol supplements: none Past cholesterol medications: simvastatin Medication compliance: poor compliance Aspirin: yes Recent stressors: no Recurrent headaches: no Visual changes: no Palpitations: no Dyspnea: no Chest pain: no Lower extremity edema: no Dizzy/lightheaded: no  DIABETES Hypoglycemic episodes:no Polydipsia/polyuria: no Visual disturbance: no Chest pain: no Paresthesias: no Glucose Monitoring: no  Accucheck frequency: Not Checking Taking Insulin?: no Blood Pressure Monitoring: not checking Retinal Examination: Not up to Date Foot Exam: Done today Diabetic Education: Not Completed Pneumovax: Refused Influenza: Given today Aspirin: no   Relevant past medical, surgical, family and social history reviewed and updated as indicated. Interim medical history since our last visit reviewed. Allergies and medications reviewed and updated.  Review of Systems  Constitutional: Negative.   Respiratory: Negative.   Cardiovascular: Negative.   Gastrointestinal: Negative.   Musculoskeletal: Negative.   Neurological: Negative.   Psychiatric/Behavioral: Negative.     Per HPI unless specifically indicated above     Objective:    BP (!) 163/78   Pulse 75   Temp  98.7 F (37.1 C) (Oral)   Ht 5\' 11"  (1.803 m)   Wt 169 lb (76.7 kg)   SpO2 97%   BMI 23.57 kg/m   Wt Readings from Last 3 Encounters:  11/09/19 169 lb (76.7 kg)  10/23/18 168 lb 1.6 oz (76.2 kg)  07/31/18 168 lb (76.2 kg)    Physical Exam Vitals and nursing note reviewed.  Constitutional:      General: He is not in acute distress.    Appearance: Normal appearance. He is not ill-appearing, toxic-appearing or diaphoretic.  HENT:     Head: Normocephalic and atraumatic.     Right Ear: External ear normal.     Left Ear: External ear normal.     Nose: Nose normal.     Mouth/Throat:     Mouth: Mucous membranes are moist.     Pharynx: Oropharynx is clear.  Eyes:     General: No scleral icterus.       Right eye: No discharge.        Left eye: No discharge.     Extraocular Movements: Extraocular movements intact.     Conjunctiva/sclera: Conjunctivae normal.     Pupils: Pupils are equal, round, and reactive to light.  Cardiovascular:     Rate and Rhythm: Normal rate and regular rhythm.     Pulses: Normal pulses.     Heart sounds: Normal heart sounds. No murmur. No friction rub. No gallop.   Pulmonary:     Effort: Pulmonary effort is normal. No respiratory distress.     Breath sounds: Normal breath sounds. No stridor. No wheezing, rhonchi or rales.  Chest:     Chest wall: No tenderness.  Musculoskeletal:  General: Normal range of motion.     Cervical back: Normal range of motion and neck supple.  Skin:    General: Skin is warm and dry.     Capillary Refill: Capillary refill takes less than 2 seconds.     Coloration: Skin is not jaundiced or pale.     Findings: No bruising, erythema, lesion or rash.  Neurological:     General: No focal deficit present.     Mental Status: He is alert and oriented to person, place, and time. Mental status is at baseline.  Psychiatric:        Mood and Affect: Mood normal.        Behavior: Behavior normal.        Thought Content: Thought  content normal.        Judgment: Judgment normal.     Results for orders placed or performed in visit on 07/31/18  CBC with Differential/Platelet  Result Value Ref Range   WBC 4.7 3.4 - 10.8 x10E3/uL   RBC 4.07 (L) 4.14 - 5.80 x10E6/uL   Hemoglobin 12.1 (L) 13.0 - 17.7 g/dL   Hematocrit 79.8 (L) 92.1 - 51.0 %   MCV 90 79 - 97 fL   MCH 29.7 26.6 - 33.0 pg   MCHC 33.1 31.5 - 35.7 g/dL   RDW 19.4 17.4 - 08.1 %   Platelets 250 150 - 450 x10E3/uL   Neutrophils 60 Not Estab. %   Lymphs 25 Not Estab. %   Monocytes 8 Not Estab. %   Eos 5 Not Estab. %   Basos 1 Not Estab. %   Neutrophils Absolute 2.8 1.4 - 7.0 x10E3/uL   Lymphocytes Absolute 1.2 0.7 - 3.1 x10E3/uL   Monocytes Absolute 0.4 0.1 - 0.9 x10E3/uL   EOS (ABSOLUTE) 0.2 0.0 - 0.4 x10E3/uL   Basophils Absolute 0.1 0.0 - 0.2 x10E3/uL   Immature Granulocytes 1 Not Estab. %   Immature Grans (Abs) 0.0 0.0 - 0.1 x10E3/uL  Comprehensive metabolic panel  Result Value Ref Range   Glucose 226 (H) 65 - 99 mg/dL   BUN 23 6 - 24 mg/dL   Creatinine, Ser 4.48 (L) 0.76 - 1.27 mg/dL   GFR calc non Af Amer 105 >59 mL/min/1.73   GFR calc Af Amer 121 >59 mL/min/1.73   BUN/Creatinine Ratio 34 (H) 9 - 20   Sodium 140 134 - 144 mmol/L   Potassium 4.5 3.5 - 5.2 mmol/L   Chloride 102 96 - 106 mmol/L   CO2 24 20 - 29 mmol/L   Calcium 9.9 8.7 - 10.2 mg/dL   Total Protein 6.6 6.0 - 8.5 g/dL   Albumin 4.5 3.5 - 5.5 g/dL   Globulin, Total 2.1 1.5 - 4.5 g/dL   Albumin/Globulin Ratio 2.1 1.2 - 2.2   Bilirubin Total 0.4 0.0 - 1.2 mg/dL   Alkaline Phosphatase 60 39 - 117 IU/L   AST 16 0 - 40 IU/L   ALT 19 0 - 44 IU/L  Lipid panel  Result Value Ref Range   Cholesterol, Total 185 100 - 199 mg/dL   Triglycerides 185 (H) 0 - 149 mg/dL   HDL 42 >63 mg/dL   VLDL Cholesterol Cal 37 5 - 40 mg/dL   LDL Calculated 149 (H) 0 - 99 mg/dL   Chol/HDL Ratio 4.4 0.0 - 5.0 ratio  PSA  Result Value Ref Range   Prostate Specific Ag, Serum 1.0 0.0 - 4.0 ng/mL    TSH  Result Value Ref Range  TSH 1.230 0.450 - 4.500 uIU/mL  Urinalysis, Routine w reflex microscopic  Result Value Ref Range   Specific Gravity, UA 1.025 1.005 - 1.030   pH, UA 5.5 5.0 - 7.5   Color, UA Yellow Yellow   Appearance Ur Clear Clear   Leukocytes, UA Negative Negative   Protein, UA Negative Negative/Trace   Glucose, UA Negative Negative   Ketones, UA Negative Negative   RBC, UA Negative Negative   Bilirubin, UA Negative Negative   Urobilinogen, Ur 0.2 0.2 - 1.0 mg/dL   Nitrite, UA Negative Negative  Bayer DCA Hb A1c Waived  Result Value Ref Range   HB A1C (BAYER DCA - WAIVED) 6.1 <7.0 %  Hepatitis C Antibody  Result Value Ref Range   Hep C Virus Ab <0.1 0.0 - 0.9 s/co ratio      Assessment & Plan:   Problem List Items Addressed This Visit      Cardiovascular and Mediastinum   Hypertension (Chronic)    Has not been taking his medicine. Will refill and he will stop in for BP check. Continue to monitor. Call with any concerns. Labs drawn today.      Relevant Medications   atorvastatin (LIPITOR) 40 MG tablet   benazepril (LOTENSIN) 40 MG tablet   Other Relevant Orders   Comprehensive metabolic panel   Microalbumin, Urine Waived   TSH   Diabetes mellitus with peripheral vascular disease (HCC) - Primary    Stable with A1c of 6.4- continue current regimen. Continue to monitor. Call with any concerns.       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 Follow up with vascular as needed. Call with any concerns.       Relevant Medications   atorvastatin (LIPITOR) 40 MG tablet   benazepril (LOTENSIN) 40 MG tablet     Other   Hyperlipidemia (Chronic)    Side effects on the simvastatin. Will  change to atorvastatin 2x a week. Call with any concerns.       Relevant Medications   atorvastatin (LIPITOR) 40 MG tablet   benazepril (LOTENSIN) 40 MG tablet   Other Relevant Orders   Comprehensive metabolic panel   Lipid Panel w/o Chol/HDL Ratio   Iron deficiency anemia   Relevant Orders   CBC with Differential/Platelet   Comprehensive metabolic panel    Other Visit Diagnoses    Prostate cancer screening       Labs drawn today. Await results.    Relevant Orders   Comprehensive metabolic panel   PSA   Flu vaccine need       Flu shot given today.   Relevant Orders   Flu Vaccine QUAD 36+ mos IM       Follow up plan: Return in about 6 months (around 05/08/2020) for follow up BP/DM/Chol.

## 2019-11-10 LAB — CBC WITH DIFFERENTIAL/PLATELET
Basophils Absolute: 0.1 10*3/uL (ref 0.0–0.2)
Basos: 2 %
EOS (ABSOLUTE): 0.1 10*3/uL (ref 0.0–0.4)
Eos: 3 %
Hematocrit: 37.4 % — ABNORMAL LOW (ref 37.5–51.0)
Hemoglobin: 12.6 g/dL — ABNORMAL LOW (ref 13.0–17.7)
Immature Grans (Abs): 0.1 10*3/uL (ref 0.0–0.1)
Immature Granulocytes: 1 %
Lymphocytes Absolute: 1.1 10*3/uL (ref 0.7–3.1)
Lymphs: 29 %
MCH: 30.5 pg (ref 26.6–33.0)
MCHC: 33.7 g/dL (ref 31.5–35.7)
MCV: 91 fL (ref 79–97)
Monocytes Absolute: 0.4 10*3/uL (ref 0.1–0.9)
Monocytes: 9 %
Neutrophils Absolute: 2.2 10*3/uL (ref 1.4–7.0)
Neutrophils: 56 %
Platelets: 236 10*3/uL (ref 150–450)
RBC: 4.13 x10E6/uL — ABNORMAL LOW (ref 4.14–5.80)
RDW: 15 % (ref 11.6–15.4)
WBC: 3.9 10*3/uL (ref 3.4–10.8)

## 2019-11-10 LAB — LIPID PANEL W/O CHOL/HDL RATIO
Cholesterol, Total: 186 mg/dL (ref 100–199)
HDL: 42 mg/dL (ref >39)
LDL Chol Calc (NIH): 112 mg/dL — ABNORMAL HIGH (ref 0–99)
Triglycerides: 184 mg/dL — ABNORMAL HIGH (ref 0–149)
VLDL Cholesterol Cal: 32 mg/dL (ref 5–40)

## 2019-11-10 LAB — COMPREHENSIVE METABOLIC PANEL
ALT: 18 IU/L (ref 0–44)
AST: 15 IU/L (ref 0–40)
Albumin/Globulin Ratio: 2 (ref 1.2–2.2)
Albumin: 4.6 g/dL (ref 3.8–4.8)
Alkaline Phosphatase: 71 IU/L (ref 39–117)
BUN/Creatinine Ratio: 25 — ABNORMAL HIGH (ref 10–24)
BUN: 18 mg/dL (ref 8–27)
Bilirubin Total: 0.3 mg/dL (ref 0.0–1.2)
CO2: 23 mmol/L (ref 20–29)
Calcium: 9.9 mg/dL (ref 8.6–10.2)
Chloride: 101 mmol/L (ref 96–106)
Creatinine, Ser: 0.73 mg/dL — ABNORMAL LOW (ref 0.76–1.27)
GFR calc Af Amer: 116 mL/min/{1.73_m2} (ref >59)
GFR calc non Af Amer: 100 mL/min/{1.73_m2} (ref >59)
Globulin, Total: 2.3 g/dL (ref 1.5–4.5)
Glucose: 189 mg/dL — ABNORMAL HIGH (ref 65–99)
Potassium: 4.6 mmol/L (ref 3.5–5.2)
Sodium: 137 mmol/L (ref 134–144)
Total Protein: 6.9 g/dL (ref 6.0–8.5)

## 2019-11-10 LAB — PSA: Prostate Specific Ag, Serum: 0.3 ng/mL (ref 0.0–4.0)

## 2019-11-10 LAB — TSH: TSH: 0.959 u[IU]/mL (ref 0.450–4.500)

## 2019-11-13 ENCOUNTER — Encounter: Payer: Self-pay | Admitting: Family Medicine

## 2019-12-10 LAB — HM DIABETES EYE EXAM

## 2019-12-14 ENCOUNTER — Ambulatory Visit: Payer: Self-pay | Admitting: Family Medicine

## 2019-12-28 LAB — HM DIABETES EYE EXAM

## 2020-06-17 ENCOUNTER — Telehealth: Payer: Self-pay | Admitting: Family Medicine

## 2020-06-17 DIAGNOSIS — I10 Essential (primary) hypertension: Secondary | ICD-10-CM

## 2020-06-17 DIAGNOSIS — E1151 Type 2 diabetes mellitus with diabetic peripheral angiopathy without gangrene: Secondary | ICD-10-CM

## 2020-06-17 MED ORDER — BENAZEPRIL HCL 40 MG PO TABS: 40.0000 mg | ORAL_TABLET | Freq: Every day | ORAL | 0 refills | Status: DC

## 2020-06-17 MED ORDER — METFORMIN HCL 500 MG PO TABS: 1000.0000 mg | ORAL_TABLET | Freq: Two times a day (BID) | ORAL | 0 refills | Status: DC

## 2020-06-17 MED ORDER — ATORVASTATIN CALCIUM 40 MG PO TABS
ORAL_TABLET | ORAL | 0 refills | Status: DC
Start: 2020-06-17 — End: 2020-06-23

## 2020-06-17 MED ORDER — GLYBURIDE 5 MG PO TABS: 5.0000 mg | ORAL_TABLET | Freq: Every day | ORAL | 0 refills | Status: DC

## 2020-06-17 NOTE — Telephone Encounter (Signed)
Called pt to see what he needed, pt states that he knows that he was suppose to come in last month but he had covid in his family and didn't want to come in. Pt states that he only has enough of his diabetes meds to last one day and is wondering if he can get a refill on his metformin, glyburide, benazepril, and atorvastatin.   Copied from CRM [id number]. Topic: General - Inquiry >> Jun 17, 2020 12:33 PM Adrian Prince D wrote: Reason for CRM: Patient would like for someone from the practice to give him a call. [phone]. Please advise.

## 2020-06-23 ENCOUNTER — Encounter: Payer: Self-pay | Admitting: Family Medicine

## 2020-06-23 ENCOUNTER — Telehealth (INDEPENDENT_AMBULATORY_CARE_PROVIDER_SITE_OTHER): Payer: Self-pay | Admitting: Family Medicine

## 2020-06-23 VITALS — Wt 163.0 lb

## 2020-06-23 DIAGNOSIS — Z23 Encounter for immunization: Secondary | ICD-10-CM

## 2020-06-23 DIAGNOSIS — D500 Iron deficiency anemia secondary to blood loss (chronic): Secondary | ICD-10-CM

## 2020-06-23 DIAGNOSIS — I10 Essential (primary) hypertension: Secondary | ICD-10-CM

## 2020-06-23 DIAGNOSIS — E1151 Type 2 diabetes mellitus with diabetic peripheral angiopathy without gangrene: Secondary | ICD-10-CM

## 2020-06-23 DIAGNOSIS — E7800 Pure hypercholesterolemia, unspecified: Secondary | ICD-10-CM

## 2020-06-23 MED ORDER — BENAZEPRIL HCL 40 MG PO TABS: 40.0000 mg | ORAL_TABLET | Freq: Every day | ORAL | 1 refills | Status: DC

## 2020-06-23 MED ORDER — GLYBURIDE 5 MG PO TABS: 5.0000 mg | ORAL_TABLET | Freq: Every day | ORAL | 1 refills | Status: DC

## 2020-06-23 MED ORDER — ATORVASTATIN CALCIUM 40 MG PO TABS
40.0000 mg | ORAL_TABLET | ORAL | 1 refills | Status: DC
Start: 2020-06-23 — End: 2020-11-10

## 2020-06-23 MED ORDER — METFORMIN HCL 500 MG PO TABS: 1000.0000 mg | ORAL_TABLET | Freq: Two times a day (BID) | ORAL | 1 refills | Status: DC

## 2020-06-23 NOTE — Assessment & Plan Note (Signed)
Feeling well. No concerns. Labs to be drawn shortly. Call with any concerns. Refills given.

## 2020-06-23 NOTE — Assessment & Plan Note (Signed)
Feeling well. No concerns. Labs to be drawn shortly. Call with any concerns. Refills given.  

## 2020-06-23 NOTE — Progress Notes (Signed)
Wt 163 lb (73.9 kg)   BMI 22.73 kg/m    Subjective:    Patient ID: Stanley Dickerson, male    DOB: 27-Nov-1960, 59 y.o.   MRN: [id number]  HPI: [name] is a 59 y.o. male  Chief Complaint  Patient presents with  . Diabetes  . Hypertension  . Hyperlipidemia   HYPERTENSION / HYPERLIPIDEMIA- has been taking his cholesterol on Monday and Friday, he may  Satisfied with current treatment? yes Duration of hypertension: chronic BP monitoring frequency: not checking BP medication side effects: no Past BP meds: benazepril Duration of hyperlipidemia: chronic Cholesterol medication side effects: no Cholesterol supplements: none Past cholesterol medications: atorvastatin Medication compliance: excellent compliance Aspirin: no Recent stressors: no Recurrent headaches: yes Visual changes: no Palpitations: no Dyspnea: no Chest pain: no Lower extremity edema: no Dizzy/lightheaded: no  DIABETES Hypoglycemic episodes:no Polydipsia/polyuria: no Visual disturbance: no Chest pain: no Paresthesias: no Glucose Monitoring: no  Accucheck frequency: Not Checking Taking Insulin?: no Blood Pressure Monitoring: not checking Retinal Examination: Not up to Date Foot Exam: Up to Date Diabetic Education: Completed Pneumovax: Up to Date Influenza: Not up to Date Aspirin: yes  Relevant past medical, surgical, family and social history reviewed and updated as indicated. Interim medical history since our last visit reviewed. Allergies and medications reviewed and updated.  Review of Systems  Constitutional: Negative.   Respiratory: Negative.   Cardiovascular: Negative.   Gastrointestinal: Negative.   Neurological: Negative.   Psychiatric/Behavioral: Negative.     Per HPI unless specifically indicated above     Objective:    Wt 163 lb (73.9 kg)   BMI 22.73 kg/m   Wt Readings from Last 3 Encounters:  06/23/20 163 lb (73.9 kg)  11/09/19 169 lb (76.7 kg)  10/23/18 168 lb  1.6 oz (76.2 kg)    Physical Exam Vitals and nursing note reviewed.  Constitutional:      General: He is not in acute distress.    Appearance: Normal appearance. He is not ill-appearing, toxic-appearing or diaphoretic.  HENT:     Head: Normocephalic and atraumatic.     Right Ear: External ear normal.     Left Ear: External ear normal.     Nose: Nose normal.     Mouth/Throat:     Mouth: Mucous membranes are moist.     Pharynx: Oropharynx is clear.  Eyes:     General: No scleral icterus.       Right eye: No discharge.        Left eye: No discharge.     Conjunctiva/sclera: Conjunctivae normal.     Pupils: Pupils are equal, round, and reactive to light.  Pulmonary:     Effort: Pulmonary effort is normal. No respiratory distress.     Comments: Speaking in full sentences Musculoskeletal:        General: Normal range of motion.     Cervical back: Normal range of motion.  Skin:    Coloration: Skin is not jaundiced or pale.     Findings: No bruising, erythema, lesion or rash.  Neurological:     Mental Status: He is alert and oriented to person, place, and time. Mental status is at baseline.  Psychiatric:        Mood and Affect: Mood normal.        Behavior: Behavior normal.        Thought Content: Thought content normal.        Judgment: Judgment normal.     Results for orders  placed or performed in visit on 12/28/19  HM DIABETES EYE EXAM  Result Value Ref Range   HM Diabetic Eye Exam No Retinopathy No Retinopathy      Assessment & Plan:   Problem List Items Addressed This Visit      Cardiovascular and Mediastinum   Hypertension - Primary (Chronic)    Feeling well. No concerns. Labs to be drawn shortly. Call with any concerns. Refills given.       Relevant Medications   atorvastatin (LIPITOR) 40 MG tablet   benazepril (LOTENSIN) 40 MG tablet   Other Relevant Orders   Comprehensive metabolic panel   Diabetes mellitus with peripheral vascular disease (HCC)    Feeling  well. No concerns. Labs to be drawn shortly. Call with any concerns. Refills given.       Relevant Medications   atorvastatin (LIPITOR) 40 MG tablet   glyBURIDE (DIABETA) 5 MG tablet   metFORMIN (GLUCOPHAGE) 500 MG tablet   benazepril (LOTENSIN) 40 MG tablet   Other Relevant Orders   Comprehensive metabolic panel   Hgb A1c w/o eAG     Other   Hyperlipidemia (Chronic)    Feeling well. No concerns. Labs to be drawn shortly. Call with any concerns. Refills given.       Relevant Medications   atorvastatin (LIPITOR) 40 MG tablet   benazepril (LOTENSIN) 40 MG tablet   Other Relevant Orders   Comprehensive metabolic panel   Lipid Panel w/o Chol/HDL Ratio   Iron deficiency anemia    Feeling well. No concerns. Labs to be drawn shortly. Call with any concerns. Refills given.       Relevant Orders   CBC with Differential/Platelet    Other Visit Diagnoses    DM (diabetes mellitus), type 2 with peripheral vascular complications (HCC)       Relevant Medications   atorvastatin (LIPITOR) 40 MG tablet   glyBURIDE (DIABETA) 5 MG tablet   metFORMIN (GLUCOPHAGE) 500 MG tablet   benazepril (LOTENSIN) 40 MG tablet   Needs flu shot       To be given when he comes in for blood work.   Relevant Orders   Flu Vaccine QUAD 6+ mos PF IM (Fluarix Quad PF)       Follow up plan: Return in about 6 months (around 12/21/2020).   . This visit was completed via MyChart due to the restrictions of the COVID-19 pandemic. All issues as above were discussed and addressed. Physical exam was done as above through visual confirmation on MyChart. If it was felt that the patient should be evaluated in the office, they were directed there. The patient verbally consented to this visit. . Location of the patient: home . Location of the provider: home . Those involved with this call:  . Provider: Olevia Perches, DO . CMA: Elton Sin, CMA . Front Desk/Registration: Adela Ports  . Time spent on call: 25  minutes with patient face to face via video conference. More than 50% of this time was spent in counseling and coordination of care. 40 minutes total spent in review of patient's record and preparation of their chart.

## 2020-06-24 ENCOUNTER — Telehealth: Payer: Self-pay | Admitting: Family Medicine

## 2020-06-24 NOTE — Telephone Encounter (Signed)
-----   Message from Dorcas Carrow, DO sent at 06/23/2020 10:47 AM EDT ----- 6 months

## 2020-06-24 NOTE — Telephone Encounter (Signed)
lvm to make this 6 months f.u

## 2020-06-27 NOTE — Telephone Encounter (Signed)
Lvm to make this apt. 

## 2020-06-30 ENCOUNTER — Encounter: Payer: Self-pay | Admitting: Family Medicine

## 2020-06-30 NOTE — Telephone Encounter (Signed)
Lvm to make this apt. Sent letter.  

## 2020-08-08 LAB — HM DIABETES EYE EXAM

## 2020-10-27 ENCOUNTER — Ambulatory Visit: Payer: Self-pay | Admitting: Family Medicine

## 2020-11-10 ENCOUNTER — Other Ambulatory Visit: Payer: Self-pay

## 2020-11-10 ENCOUNTER — Ambulatory Visit (INDEPENDENT_AMBULATORY_CARE_PROVIDER_SITE_OTHER): Payer: Self-pay | Admitting: Family Medicine

## 2020-11-10 ENCOUNTER — Encounter: Payer: Self-pay | Admitting: Family Medicine

## 2020-11-10 VITALS — BP 166/74 | HR 73 | Temp 99.1°F | Wt 170.0 lb

## 2020-11-10 DIAGNOSIS — Z125 Encounter for screening for malignant neoplasm of prostate: Secondary | ICD-10-CM

## 2020-11-10 DIAGNOSIS — I10 Essential (primary) hypertension: Secondary | ICD-10-CM

## 2020-11-10 DIAGNOSIS — Z23 Encounter for immunization: Secondary | ICD-10-CM

## 2020-11-10 DIAGNOSIS — D500 Iron deficiency anemia secondary to blood loss (chronic): Secondary | ICD-10-CM

## 2020-11-10 DIAGNOSIS — E1151 Type 2 diabetes mellitus with diabetic peripheral angiopathy without gangrene: Secondary | ICD-10-CM

## 2020-11-10 DIAGNOSIS — G8929 Other chronic pain: Secondary | ICD-10-CM

## 2020-11-10 DIAGNOSIS — R519 Headache, unspecified: Secondary | ICD-10-CM

## 2020-11-10 DIAGNOSIS — K922 Gastrointestinal hemorrhage, unspecified: Secondary | ICD-10-CM

## 2020-11-10 DIAGNOSIS — E7800 Pure hypercholesterolemia, unspecified: Secondary | ICD-10-CM

## 2020-11-10 LAB — BAYER DCA HB A1C WAIVED: HB A1C (BAYER DCA - WAIVED): 7.4 % — ABNORMAL HIGH (ref <7.0)

## 2020-11-10 MED ORDER — BENAZEPRIL HCL 40 MG PO TABS: 40.0000 mg | ORAL_TABLET | Freq: Every day | ORAL | 1 refills | Status: DC

## 2020-11-10 MED ORDER — METFORMIN HCL 500 MG PO TABS: 1000.0000 mg | ORAL_TABLET | Freq: Two times a day (BID) | ORAL | 1 refills | Status: DC

## 2020-11-10 MED ORDER — ATORVASTATIN CALCIUM 40 MG PO TABS: 40.0000 mg | ORAL_TABLET | Freq: Every day | ORAL | 1 refills | Status: DC

## 2020-11-10 MED ORDER — GLYBURIDE 5 MG PO TABS: 5.0000 mg | ORAL_TABLET | Freq: Every day | ORAL | 1 refills | Status: DC

## 2020-11-10 MED ORDER — HYDROCHLOROTHIAZIDE 25 MG PO TABS: 25.0000 mg | ORAL_TABLET | Freq: Every day | ORAL | 3 refills | Status: DC

## 2020-11-10 MED ORDER — FERROUS SULFATE 325 (65 FE) MG PO TABS: 325.0000 mg | ORAL_TABLET | Freq: Two times a day (BID) | ORAL | 12 refills | Status: DC

## 2020-11-10 NOTE — Progress Notes (Signed)
BP (!) 166/74   Pulse 73   Temp 99.1 F (37.3 C)   Wt 170 lb (77.1 kg)   SpO2 98%   BMI 23.71 kg/m    Subjective:    Patient ID: Stanley Dickerson, male    DOB: February 11, 1961, 60 y.o.   MRN: [id number]  HPI: Stanley Dickerson is a 60 y.o. male  Chief Complaint  Patient presents with  . Diabetes  . Hypertension  . Hyperlipidemia   HEADACHES- Is not someone who has had headaches, but he has started with headaches in the past 4 months.  Duration: 4 months Onset: sudden Severity: moderate Quality: grinding, steady, aching Frequency: every other day Location: temples and in the base of his neck Headache duration: until he takes ibuprofen, 3-5 hours Radiation: no Time of day headache occurs: at random Alleviating factors: ibuprofen Aggravating factors: nothing Headache status at time of visit: current headache Treatments attempted: Treatments attempted: APAP and ibuprofen   Aura: no- has had some floaters Nausea:  no Vomiting: no  Photophobia:  no Phonophobia:  no Effect on social functioning:  no Confusion:  no Gait disturbance/ataxia:  no Behavioral changes:  no Fevers:  no  HYPERTENSION / HYPERLIPIDEMIA Satisfied with current treatment? no Duration of hypertension: chronic BP monitoring frequency: not checking BP medication side effects: no Past BP meds: benazepril  Duration of hyperlipidemia: chronic Cholesterol medication side effects: no Cholesterol supplements: none Past cholesterol medications: atorvastatin Medication compliance: excellent compliance Aspirin: yes Recent stressors: no Recurrent headaches: yes Visual changes: no Palpitations: no Dyspnea: no Chest pain: no Lower extremity edema: no Dizzy/lightheaded: no  DIABETES Hypoglycemic episodes:no Polydipsia/polyuria: no Visual disturbance: no Chest pain: no Paresthesias: no Glucose Monitoring: no  Accucheck frequency: Not Checking Taking Insulin?: no Blood Pressure Monitoring: not  checking Retinal Examination: Up to Date Foot Exam: Up to Date Diabetic Education: Completed Pneumovax: Refused Influenza: Up to Date Aspirin: yes  Relevant past medical, surgical, family and social history reviewed and updated as indicated. Interim medical history since our last visit reviewed. Allergies and medications reviewed and updated.  Review of Systems  Constitutional: Negative.   Respiratory: Negative.   Cardiovascular: Negative.   Gastrointestinal: Positive for abdominal pain, blood in stool and diarrhea. Negative for abdominal distention, anal bleeding, constipation, nausea, rectal pain and vomiting.  Musculoskeletal: Negative.   Neurological: Positive for headaches. Negative for dizziness, tremors, seizures, syncope, facial asymmetry, speech difficulty, weakness, light-headedness and numbness.  Psychiatric/Behavioral: Negative.     Per HPI unless specifically indicated above     Objective:    BP (!) 166/74   Pulse 73   Temp 99.1 F (37.3 C)   Wt 170 lb (77.1 kg)   SpO2 98%   BMI 23.71 kg/m   Wt Readings from Last 3 Encounters:  11/10/20 170 lb (77.1 kg)  06/23/20 163 lb (73.9 kg)  11/09/19 169 lb (76.7 kg)    Physical Exam Vitals and nursing note reviewed.  Constitutional:      General: He is not in acute distress.    Appearance: Normal appearance. He is not ill-appearing, toxic-appearing or diaphoretic.  HENT:     Head: Normocephalic and atraumatic.     Right Ear: External ear normal.     Left Ear: External ear normal.     Nose: Nose normal.     Mouth/Throat:     Mouth: Mucous membranes are moist.     Pharynx: Oropharynx is clear.  Eyes:     General: No scleral icterus.  Right eye: No discharge.        Left eye: No discharge.     Extraocular Movements: Extraocular movements intact.     Conjunctiva/sclera: Conjunctivae normal.     Pupils: Pupils are equal, round, and reactive to light.  Cardiovascular:     Rate and Rhythm: Normal rate and  regular rhythm.     Pulses: Normal pulses.     Heart sounds: Normal heart sounds. No murmur heard. No friction rub. No gallop.   Pulmonary:     Effort: Pulmonary effort is normal. No respiratory distress.     Breath sounds: Normal breath sounds. No stridor. No wheezing, rhonchi or rales.  Chest:     Chest wall: No tenderness.  Musculoskeletal:        General: Normal range of motion.     Cervical back: Normal range of motion and neck supple.  Skin:    General: Skin is warm and dry.     Capillary Refill: Capillary refill takes less than 2 seconds.     Coloration: Skin is not jaundiced or pale.     Findings: No bruising, erythema, lesion or rash.  Neurological:     General: No focal deficit present.     Mental Status: He is alert and oriented to person, place, and time. Mental status is at baseline.  Psychiatric:        Mood and Affect: Mood normal.        Behavior: Behavior normal.        Thought Content: Thought content normal.        Judgment: Judgment normal.     Results for orders placed or performed in visit on 11/10/20  CBC with Differential/Platelet  Result Value Ref Range   WBC 5.0 3.4 - 10.8 x10E3/uL   RBC 4.00 (L) 4.14 - 5.80 x10E6/uL   Hemoglobin 12.4 (L) 13.0 - 17.7 g/dL   Hematocrit 16.1 (L) 09.6 - 51.0 %   MCV 93 79 - 97 fL   MCH 31.0 26.6 - 33.0 pg   MCHC 33.2 31.5 - 35.7 g/dL   RDW 04.5 40.9 - 81.1 %   Platelets 231 150 - 450 x10E3/uL   Neutrophils 64 Not Estab. %   Lymphs 22 Not Estab. %   Monocytes 8 Not Estab. %   Eos 4 Not Estab. %   Basos 1 Not Estab. %   Neutrophils Absolute 3.2 1.4 - 7.0 x10E3/uL   Lymphocytes Absolute 1.1 0.7 - 3.1 x10E3/uL   Monocytes Absolute 0.4 0.1 - 0.9 x10E3/uL   EOS (ABSOLUTE) 0.2 0.0 - 0.4 x10E3/uL   Basophils Absolute 0.1 0.0 - 0.2 x10E3/uL   Immature Granulocytes 1 Not Estab. %   Immature Grans (Abs) 0.0 0.0 - 0.1 x10E3/uL  Lipid Panel w/o Chol/HDL Ratio  Result Value Ref Range   Cholesterol, Total 121 100 - 199  mg/dL   Triglycerides 914 (H) 0 - 149 mg/dL   HDL 43 >78 mg/dL   VLDL Cholesterol Cal 31 5 - 40 mg/dL   LDL Chol Calc (NIH) 47 0 - 99 mg/dL  Comprehensive metabolic panel  Result Value Ref Range   Glucose 327 (H) 65 - 99 mg/dL   BUN 17 8 - 27 mg/dL   Creatinine, Ser 2.95 0.76 - 1.27 mg/dL   GFR calc non Af Amer 95 >59 mL/min/1.73   GFR calc Af Amer 110 >59 mL/min/1.73   BUN/Creatinine Ratio 21 10 - 24   Sodium 139 134 - 144 mmol/L  Potassium 5.0 3.5 - 5.2 mmol/L   Chloride 99 96 - 106 mmol/L   CO2 24 20 - 29 mmol/L   Calcium 10.1 8.6 - 10.2 mg/dL   Total Protein 6.6 6.0 - 8.5 g/dL   Albumin 4.5 3.8 - 4.8 g/dL   Globulin, Total 2.1 1.5 - 4.5 g/dL   Albumin/Globulin Ratio 2.1 1.2 - 2.2   Bilirubin Total 0.7 0.0 - 1.2 mg/dL   Alkaline Phosphatase 75 44 - 121 IU/L   AST 20 0 - 40 IU/L   ALT 24 0 - 44 IU/L  PSA  Result Value Ref Range   Prostate Specific Ag, Serum 0.4 0.0 - 4.0 ng/mL  Bayer DCA Hb A1c Waived  Result Value Ref Range   HB A1C (BAYER DCA - WAIVED) 7.4 (H) <7.0 %  Hgb A1c w/o eAG  Result Value Ref Range   Hgb A1c MFr Bld 7.9 (H) 4.8 - 5.6 %      Assessment & Plan:   Problem List Items Addressed This Visit      Cardiovascular and Mediastinum   Hypertension (Chronic)    Not under good control. Will add HCTZ and recheck 1 month. Call with any concerns.       Relevant Medications   hydrochlorothiazide (HYDRODIURIL) 25 MG tablet   atorvastatin (LIPITOR) 40 MG tablet   benazepril (LOTENSIN) 40 MG tablet   Diabetes mellitus with peripheral vascular disease (HCC) - Primary    Going in the wrong direction with A1c of 7.4 up from 6.9- will really watch diet and recheck 3 months- if still elevated, will need to consider adding additional agent.       Relevant Medications   hydrochlorothiazide (HYDRODIURIL) 25 MG tablet   atorvastatin (LIPITOR) 40 MG tablet   benazepril (LOTENSIN) 40 MG tablet   glyBURIDE (DIABETA) 5 MG tablet   metFORMIN (GLUCOPHAGE) 500 MG  tablet     Digestive   Gastrointestinal hemorrhage    Chronic. On review of chart, patient had capsule endoscopy done in 2019 which showed hemorrhages in the small intestines. Due to issues with insurance, he was trying to get into [facility] for treatment- but he never did. This has continued. Will recheck labs today. Will need to get back in with GI- will send to [facility] due to insurance issues. Await their input.       Relevant Orders   Ambulatory referral to Gastroenterology     Other   Hyperlipidemia (Chronic)    Under good control on current regimen. Continue current regimen. Continue to monitor. Call with any concerns. Refills given. Labs drawn today.        Relevant Medications   hydrochlorothiazide (HYDRODIURIL) 25 MG tablet   atorvastatin (LIPITOR) 40 MG tablet   benazepril (LOTENSIN) 40 MG tablet   Iron deficiency anemia    Chronic. On review of chart, patient had capsule endoscopy done in 2019 which showed hemorrhages in the small intestines. Due to issues with insurance, he was trying to get into [facility] for treatment- but he never did. This has continued. Will recheck labs today. Will need to get back in with GI- will send to [facility] due to insurance issues. Await their input.       Relevant Medications   ferrous sulfate 325 (65 FE) MG tablet   Other Relevant Orders   Ambulatory referral to Gastroenterology    Other Visit Diagnoses    Chronic nonintractable headache, unspecified headache type       Of unclear etiology- Possibly  due to blood pressure or anemia. Will check labs and treat blood pressure. Await results, if not improving, consider neck imaging.   Essential hypertension       Relevant Medications   hydrochlorothiazide (HYDRODIURIL) 25 MG tablet   atorvastatin (LIPITOR) 40 MG tablet   benazepril (LOTENSIN) 40 MG tablet   DM (diabetes mellitus), type 2 with peripheral vascular complications (HCC)       Relevant Medications   hydrochlorothiazide (HYDRODIURIL) 25 MG tablet    atorvastatin (LIPITOR) 40 MG tablet   benazepril (LOTENSIN) 40 MG tablet   glyBURIDE (DIABETA) 5 MG tablet   metFORMIN (GLUCOPHAGE) 500 MG tablet   Other Relevant Orders   Bayer DCA Hb A1c Waived (Completed)   Screening for prostate cancer       Checking labs today. Await results.    Relevant Orders   PSA (Completed)   Needs flu shot       Given today.        Follow up plan: Return in about 4 weeks (around 12/08/2020).   >50 minutes spent with patient today

## 2020-11-11 LAB — COMPREHENSIVE METABOLIC PANEL
ALT: 24 IU/L (ref 0–44)
AST: 20 IU/L (ref 0–40)
Albumin/Globulin Ratio: 2.1 (ref 1.2–2.2)
Albumin: 4.5 g/dL (ref 3.8–4.8)
Alkaline Phosphatase: 75 IU/L (ref 44–121)
BUN/Creatinine Ratio: 21 (ref 10–24)
BUN: 17 mg/dL (ref 8–27)
Bilirubin Total: 0.7 mg/dL (ref 0.0–1.2)
CO2: 24 mmol/L (ref 20–29)
Calcium: 10.1 mg/dL (ref 8.6–10.2)
Chloride: 99 mmol/L (ref 96–106)
Creatinine, Ser: 0.81 mg/dL (ref 0.76–1.27)
GFR calc Af Amer: 110 mL/min/{1.73_m2} (ref >59)
GFR calc non Af Amer: 95 mL/min/{1.73_m2} (ref >59)
Globulin, Total: 2.1 g/dL (ref 1.5–4.5)
Glucose: 327 mg/dL — ABNORMAL HIGH (ref 65–99)
Potassium: 5 mmol/L (ref 3.5–5.2)
Sodium: 139 mmol/L (ref 134–144)
Total Protein: 6.6 g/dL (ref 6.0–8.5)

## 2020-11-11 LAB — CBC WITH DIFFERENTIAL/PLATELET
Basophils Absolute: 0.1 10*3/uL (ref 0.0–0.2)
Basos: 1 %
EOS (ABSOLUTE): 0.2 10*3/uL (ref 0.0–0.4)
Eos: 4 %
Hematocrit: 37.3 % — ABNORMAL LOW (ref 37.5–51.0)
Hemoglobin: 12.4 g/dL — ABNORMAL LOW (ref 13.0–17.7)
Immature Grans (Abs): 0 10*3/uL (ref 0.0–0.1)
Immature Granulocytes: 1 %
Lymphocytes Absolute: 1.1 10*3/uL (ref 0.7–3.1)
Lymphs: 22 %
MCH: 31 pg (ref 26.6–33.0)
MCHC: 33.2 g/dL (ref 31.5–35.7)
MCV: 93 fL (ref 79–97)
Monocytes Absolute: 0.4 10*3/uL (ref 0.1–0.9)
Monocytes: 8 %
Neutrophils Absolute: 3.2 10*3/uL (ref 1.4–7.0)
Neutrophils: 64 %
Platelets: 231 10*3/uL (ref 150–450)
RBC: 4 x10E6/uL — ABNORMAL LOW (ref 4.14–5.80)
RDW: 12.8 % (ref 11.6–15.4)
WBC: 5 10*3/uL (ref 3.4–10.8)

## 2020-11-11 LAB — LIPID PANEL W/O CHOL/HDL RATIO
Cholesterol, Total: 121 mg/dL (ref 100–199)
HDL: 43 mg/dL (ref >39)
LDL Chol Calc (NIH): 47 mg/dL (ref 0–99)
Triglycerides: 193 mg/dL — ABNORMAL HIGH (ref 0–149)
VLDL Cholesterol Cal: 31 mg/dL (ref 5–40)

## 2020-11-11 LAB — HGB A1C W/O EAG: Hgb A1c MFr Bld: 7.9 % — ABNORMAL HIGH (ref 4.8–5.6)

## 2020-11-11 LAB — PSA: Prostate Specific Ag, Serum: 0.4 ng/mL (ref 0.0–4.0)

## 2020-11-13 DIAGNOSIS — K922 Gastrointestinal hemorrhage, unspecified: Secondary | ICD-10-CM | POA: Insufficient documentation

## 2020-11-13 NOTE — Assessment & Plan Note (Signed)
Under good control on current regimen. Continue current regimen. Continue to monitor. Call with any concerns. Refills given. Labs drawn today.   

## 2020-11-13 NOTE — Assessment & Plan Note (Signed)
Not under good control. Will add HCTZ and recheck 1 month. Call with any concerns.  

## 2020-11-13 NOTE — Assessment & Plan Note (Signed)
Going in the wrong direction with A1c of 7.4 up from 6.9- will really watch diet and recheck 3 months- if still elevated, will need to consider adding additional agent.

## 2020-11-13 NOTE — Assessment & Plan Note (Signed)
Chronic. On review of chart, patient had capsule endoscopy done in 2019 which showed hemorrhages in the small intestines. Due to issues with insurance, he was trying to get into [facility] for treatment- but he never did. This has continued. Will recheck labs today. Will need to get back in with GI- will send to [facility] due to insurance issues. Await their input.

## 2020-11-13 NOTE — Assessment & Plan Note (Signed)
Chronic. On review of chart, patient had capsule endoscopy done in 2019 which showed hemorrhages in the small intestines. Due to issues with insurance, he was trying to get into [facility] for treatment- but he never did. This has continued. Will recheck labs today. Will need to get back in with GI- will send to [facility] due to insurance issues. Await their input.  

## 2020-11-14 ENCOUNTER — Ambulatory Visit: Payer: Self-pay | Admitting: *Deleted

## 2020-11-14 NOTE — Telephone Encounter (Signed)
Pt given result per Dr Laural Benes,  "Please let him know that his labs are stable. His blood count is still anemic- but has stayed about the same. Everything else looks good. Thanks!"; the pt verbalized understanding but is concerned because his BP and CBG have been elevated over the weekend; the pt says says he bought a new meter and his cbg was 206 11/13/19, he is not sure if this was after he had eaten; on 11/14/20 his cbg was 163 at 0400; the pt states he was prescribed HCTZ on 11/11/20 but his last BP was 165/89 at 2100 on 11/13/20; the pt's first dose of HCTZ was on 11/11/20; he has not taken his BP since the previous reading; the pt would like to discuss this with Dr Laural Benes; he can be contacted at [phone]; will route to office for notification of provider.  Reason for Disposition . Caller requesting lab results  Answer Assessment - Initial Assessment Questions 1. REASON FOR CALL or QUESTION: "What is your reason for calling today?" or "How can I best help you?" or "What question do you have that I can help answer?"     Lab results  Answer Assessment - Initial Assessment Questions 1. REASON FOR CALL or QUESTION: "What is your reason for calling today?" or "How can I best help you?" or "What question do you have that I can help answer?"     Lab results 2. CALLER: Document the source of call. (e.g., laboratory, patient).    patient  Protocols used: PCP CALL - NO TRIAGE-A-AH, INFORMATION ONLY CALL - NO TRIAGE-A-AH

## 2020-11-14 NOTE — Telephone Encounter (Signed)
Please let him know that it can take a few doses of his medicine to get into his system. If he's anxious about waiting another 3 weeks for a follow up, we can move it up.

## 2020-12-08 ENCOUNTER — Other Ambulatory Visit: Payer: Self-pay

## 2020-12-08 ENCOUNTER — Ambulatory Visit: Payer: Self-pay | Admitting: Family Medicine

## 2020-12-08 ENCOUNTER — Encounter: Payer: Self-pay | Admitting: Family Medicine

## 2020-12-08 VITALS — BP 112/69 | HR 62 | Temp 98.5°F | Wt 169.6 lb

## 2020-12-08 DIAGNOSIS — M25561 Pain in right knee: Secondary | ICD-10-CM

## 2020-12-08 DIAGNOSIS — I10 Essential (primary) hypertension: Secondary | ICD-10-CM

## 2020-12-08 DIAGNOSIS — R519 Headache, unspecified: Secondary | ICD-10-CM

## 2020-12-08 DIAGNOSIS — G8929 Other chronic pain: Secondary | ICD-10-CM

## 2020-12-08 DIAGNOSIS — M25562 Pain in left knee: Secondary | ICD-10-CM

## 2020-12-08 LAB — URINALYSIS, ROUTINE W REFLEX MICROSCOPIC
Bilirubin, UA: NEGATIVE
Glucose, UA: NEGATIVE
Ketones, UA: NEGATIVE
Nitrite, UA: NEGATIVE
Protein,UA: NEGATIVE
RBC, UA: NEGATIVE
Specific Gravity, UA: 1.02 (ref 1.005–1.030)
Urobilinogen, Ur: 0.2 mg/dL (ref 0.2–1.0)
pH, UA: 7 (ref 5.0–7.5)

## 2020-12-08 LAB — MICROSCOPIC EXAMINATION
Cast Type: NONE SEEN
Casts: NONE SEEN /lpf
Crystal Type: NONE SEEN
Crystals: NONE SEEN
Mucus, UA: NONE SEEN
Renal Epithel, UA: NONE SEEN /hpf
Trichomonas, UA: NONE SEEN
Yeast, UA: NONE SEEN

## 2020-12-08 LAB — MICROALBUMIN, URINE WAIVED
Creatinine, Urine Waived: 100 mg/dL (ref 10–300)
Microalb, Ur Waived: 10 mg/L (ref 0–19)
Microalb/Creat Ratio: <30 mg/g (ref <30)

## 2020-12-08 MED ORDER — HYDROCHLOROTHIAZIDE 25 MG PO TABS: 25.0000 mg | ORAL_TABLET | Freq: Every day | ORAL | 1 refills | Status: DC

## 2020-12-08 NOTE — Assessment & Plan Note (Signed)
Under good control on current regimen. Continue current regimen. Continue to monitor. Call with any concerns. Refills given today. Labs drawn.

## 2020-12-08 NOTE — Progress Notes (Signed)
BP 112/69   Pulse 62   Temp 98.5 F (36.9 C)   Wt 169 lb 9.6 oz (76.9 kg)   SpO2 98%   BMI 23.65 kg/m    Subjective:    Patient ID: Stanley Dickerson, male    DOB: [birth year], 60 y.o.   MRN: [id number]  HPI: Stanley Dickerson is a 60 y.o. male  Chief Complaint  Patient presents with  . Hypertension  . Headache    Follow up, patient states headaches are less frequent but just as intense.   . Diabetes   HYPERTENSION Hypertension status: controlled  Satisfied with current treatment? yes Duration of hypertension: chronic BP monitoring frequency:  a few times a week BP medication side effects:  no Medication compliance: excellent compliance Previous BP meds: benazepril, HCTZ Aspirin: yes Recurrent headaches: yes Visual changes: no Palpitations: no Dyspnea: no Chest pain: no Lower extremity edema: no Dizzy/lightheaded: no  Has been having leg pain for years. Saw vascular and his veins were good. He has been having pain in his knees down to his ankle.   Relevant past medical, surgical, family and social history reviewed and updated as indicated. Interim medical history since our last visit reviewed. Allergies and medications reviewed and updated.  Review of Systems  Constitutional: Negative.   Respiratory: Negative.   Cardiovascular: Negative.   Gastrointestinal: Negative.   Psychiatric/Behavioral: Negative.     Per HPI unless specifically indicated above     Objective:    BP 112/69   Pulse 62   Temp 98.5 F (36.9 C)   Wt 169 lb 9.6 oz (76.9 kg)   SpO2 98%   BMI 23.65 kg/m   Wt Readings from Last 3 Encounters:  12/08/20 169 lb 9.6 oz (76.9 kg)  11/10/20 170 lb (77.1 kg)  06/23/20 163 lb (73.9 kg)    Physical Exam Vitals and nursing note reviewed.  Constitutional:      General: He is not in acute distress.    Appearance: Normal appearance. He is not ill-appearing, toxic-appearing or diaphoretic.  HENT:     Head: Normocephalic and atraumatic.      Right Ear: External ear normal.     Left Ear: External ear normal.     Nose: Nose normal.     Mouth/Throat:     Mouth: Mucous membranes are moist.     Pharynx: Oropharynx is clear.  Eyes:     General: No scleral icterus.       Right eye: No discharge.        Left eye: No discharge.     Extraocular Movements: Extraocular movements intact.     Conjunctiva/sclera: Conjunctivae normal.     Pupils: Pupils are equal, round, and reactive to light.  Cardiovascular:     Rate and Rhythm: Normal rate and regular rhythm.     Pulses: Normal pulses.     Heart sounds: Normal heart sounds. No murmur heard. No friction rub. No gallop.   Pulmonary:     Effort: Pulmonary effort is normal. No respiratory distress.     Breath sounds: Normal breath sounds. No stridor. No wheezing, rhonchi or rales.  Chest:     Chest wall: No tenderness.  Musculoskeletal:        General: Normal range of motion.     Cervical back: Normal range of motion and neck supple.  Skin:    General: Skin is warm and dry.     Capillary Refill: Capillary refill takes less than 2 seconds.  Coloration: Skin is not jaundiced or pale.     Findings: No bruising, erythema, lesion or rash.  Neurological:     General: No focal deficit present.     Mental Status: He is alert and oriented to person, place, and time. Mental status is at baseline.  Psychiatric:        Mood and Affect: Mood normal.        Behavior: Behavior normal.        Thought Content: Thought content normal.        Judgment: Judgment normal.     Results for orders placed or performed in visit on 11/10/20  CBC with Differential/Platelet  Result Value Ref Range   WBC 5.0 3.4 - 10.8 x10E3/uL   RBC 4.00 (L) 4.14 - 5.80 x10E6/uL   Hemoglobin 12.4 (L) 13.0 - 17.7 g/dL   Hematocrit 10.2 (L) 72.5 - 51.0 %   MCV 93 79 - 97 fL   MCH 31.0 26.6 - 33.0 pg   MCHC 33.2 31.5 - 35.7 g/dL   RDW 36.6 44.0 - 34.7 %   Platelets 231 150 - 450 x10E3/uL   Neutrophils 64 Not  Estab. %   Lymphs 22 Not Estab. %   Monocytes 8 Not Estab. %   Eos 4 Not Estab. %   Basos 1 Not Estab. %   Neutrophils Absolute 3.2 1.4 - 7.0 x10E3/uL   Lymphocytes Absolute 1.1 0.7 - 3.1 x10E3/uL   Monocytes Absolute 0.4 0.1 - 0.9 x10E3/uL   EOS (ABSOLUTE) 0.2 0.0 - 0.4 x10E3/uL   Basophils Absolute 0.1 0.0 - 0.2 x10E3/uL   Immature Granulocytes 1 Not Estab. %   Immature Grans (Abs) 0.0 0.0 - 0.1 x10E3/uL  Lipid Panel w/o Chol/HDL Ratio  Result Value Ref Range   Cholesterol, Total 121 100 - 199 mg/dL   Triglycerides 425 (H) 0 - 149 mg/dL   HDL 43 >95 mg/dL   VLDL Cholesterol Cal 31 5 - 40 mg/dL   LDL Chol Calc (NIH) 47 0 - 99 mg/dL  Comprehensive metabolic panel  Result Value Ref Range   Glucose 327 (H) 65 - 99 mg/dL   BUN 17 8 - 27 mg/dL   Creatinine, Ser 6.38 0.76 - 1.27 mg/dL   GFR calc non Af Amer 95 >59 mL/min/1.73   GFR calc Af Amer 110 >59 mL/min/1.73   BUN/Creatinine Ratio 21 10 - 24   Sodium 139 134 - 144 mmol/L   Potassium 5.0 3.5 - 5.2 mmol/L   Chloride 99 96 - 106 mmol/L   CO2 24 20 - 29 mmol/L   Calcium 10.1 8.6 - 10.2 mg/dL   Total Protein 6.6 6.0 - 8.5 g/dL   Albumin 4.5 3.8 - 4.8 g/dL   Globulin, Total 2.1 1.5 - 4.5 g/dL   Albumin/Globulin Ratio 2.1 1.2 - 2.2   Bilirubin Total 0.7 0.0 - 1.2 mg/dL   Alkaline Phosphatase 75 44 - 121 IU/L   AST 20 0 - 40 IU/L   ALT 24 0 - 44 IU/L  PSA  Result Value Ref Range   Prostate Specific Ag, Serum 0.4 0.0 - 4.0 ng/mL  Bayer DCA Hb A1c Waived  Result Value Ref Range   HB A1C (BAYER DCA - WAIVED) 7.4 (H) <7.0 %  Hgb A1c w/o eAG  Result Value Ref Range   Hgb A1c MFr Bld 7.9 (H) 4.8 - 5.6 %      Assessment & Plan:   Problem List Items Addressed This Visit  Cardiovascular and Mediastinum   Hypertension - Primary (Chronic)    Under good control on current regimen. Continue current regimen. Continue to monitor. Call with any concerns. Refills given today. Labs drawn.       Relevant Medications    hydrochlorothiazide (HYDRODIURIL) 25 MG tablet    Other Visit Diagnoses    Chronic nonintractable headache, unspecified headache type       Will continue to monitor. If getting worse will call.    Chronic pain of both knees       Will obtain x-rays of knees. Await results. Treat as needed.    Relevant Orders   DG Knee Complete 4 Views Left   DG Knee Complete 4 Views Right   Essential hypertension       Relevant Medications   hydrochlorothiazide (HYDRODIURIL) 25 MG tablet   Other Relevant Orders   Microalbumin, Urine Waived   Basic metabolic panel   Urinalysis, Routine w reflex microscopic       Follow up plan: Return in about 2 months (around 02/05/2021).

## 2020-12-09 LAB — BASIC METABOLIC PANEL
BUN/Creatinine Ratio: 23 (ref 10–24)
BUN: 18 mg/dL (ref 8–27)
CO2: 22 mmol/L (ref 20–29)
Calcium: 10.2 mg/dL (ref 8.6–10.2)
Chloride: 95 mmol/L — ABNORMAL LOW (ref 96–106)
Creatinine, Ser: 0.77 mg/dL (ref 0.76–1.27)
GFR calc Af Amer: 112 mL/min/{1.73_m2} (ref >59)
GFR calc non Af Amer: 97 mL/min/{1.73_m2} (ref >59)
Glucose: 103 mg/dL — ABNORMAL HIGH (ref 65–99)
Potassium: 4.5 mmol/L (ref 3.5–5.2)
Sodium: 132 mmol/L — ABNORMAL LOW (ref 134–144)

## 2020-12-13 ENCOUNTER — Other Ambulatory Visit: Payer: Self-pay

## 2020-12-13 ENCOUNTER — Ambulatory Visit
Admission: RE | Admit: 2020-12-13 | Discharge: 2020-12-13 | Disposition: A | Discharge status: Home / Self Care | Payer: Self-pay | Source: Ambulatory Visit | Attending: Family Medicine | Admitting: Family Medicine

## 2020-12-13 ENCOUNTER — Ambulatory Visit
Admission: RE | Admit: 2020-12-13 | Discharge: 2020-12-13 | Disposition: A | Discharge status: Home / Self Care | Payer: Self-pay | Source: Home / Self Care | Attending: Family Medicine | Admitting: Family Medicine

## 2020-12-13 DIAGNOSIS — M25561 Pain in right knee: Secondary | ICD-10-CM | POA: Insufficient documentation

## 2020-12-13 DIAGNOSIS — G8929 Other chronic pain: Secondary | ICD-10-CM | POA: Insufficient documentation

## 2020-12-13 DIAGNOSIS — M25562 Pain in left knee: Secondary | ICD-10-CM | POA: Insufficient documentation

## 2020-12-23 ENCOUNTER — Telehealth: Payer: Self-pay | Admitting: Family Medicine

## 2020-12-23 DIAGNOSIS — G8929 Other chronic pain: Secondary | ICD-10-CM

## 2020-12-23 NOTE — Telephone Encounter (Signed)
-----   Message from Hyman Bible, CMA sent at 12/19/2020  5:53 PM EST ----- Erskine Speed in [place] [facility]

## 2020-12-26 ENCOUNTER — Telehealth: Payer: Self-pay

## 2020-12-26 NOTE — Telephone Encounter (Signed)
Copied from CRM [id number]. Topic: General - Other >> Dec 19, 2020 10:31 AM Reel, Gwenith Spitz, CMA wrote: Discuss referral >> Dec 26, 2020  1:24 PM Baldo Daub L wrote: This ended up in the generic [facility] CRM pool.  Please let us know how we can be of assistance.

## 2021-01-18 ENCOUNTER — Other Ambulatory Visit: Payer: Self-pay | Admitting: Family Medicine

## 2021-01-18 DIAGNOSIS — I10 Essential (primary) hypertension: Secondary | ICD-10-CM

## 2021-01-22 DIAGNOSIS — D509 Iron deficiency anemia, unspecified: Secondary | ICD-10-CM

## 2021-01-22 DIAGNOSIS — E119 Type 2 diabetes mellitus without complications: Secondary | ICD-10-CM | POA: Insufficient documentation

## 2021-02-06 ENCOUNTER — Other Ambulatory Visit: Payer: Self-pay

## 2021-02-06 ENCOUNTER — Encounter: Payer: Self-pay | Admitting: Family Medicine

## 2021-02-06 ENCOUNTER — Ambulatory Visit (INDEPENDENT_AMBULATORY_CARE_PROVIDER_SITE_OTHER): Payer: Self-pay | Admitting: Family Medicine

## 2021-02-06 VITALS — BP 139/66 | HR 72 | Temp 98.4°F | Wt 167.8 lb

## 2021-02-06 DIAGNOSIS — E1151 Type 2 diabetes mellitus with diabetic peripheral angiopathy without gangrene: Secondary | ICD-10-CM

## 2021-02-06 NOTE — Patient Instructions (Addendum)
[facility] [place]  [address]  [place], [place] [address]  [phone]     https://www.diabeteseducator.org/docs/default-source/living-with-diabetes/conquering-the-grocery-store-v1.pdf?sfvrsn=4">  Carbohydrate Counting for Diabetes Mellitus, Adult Carbohydrate counting is a method of keeping track of how many carbohydrates you eat. Eating carbohydrates naturally increases the amount of sugar (glucose) in the blood. Counting how many carbohydrates you eat improves your blood glucose control, which helps you manage your diabetes. It is important to know how many carbohydrates you can safely have in each meal. This is different for every person. A dietitian can help you make a meal plan and calculate how many carbohydrates you should have at each meal and snack. What foods contain carbohydrates? Carbohydrates are found in the following foods:  Grains, such as breads and cereals.  Dried beans and soy products.  Starchy vegetables, such as potatoes, peas, and corn.  Fruit and fruit juices.  Milk and yogurt.  Sweets and snack foods, such as cake, cookies, candy, chips, and soft drinks.   How do I count carbohydrates in foods? There are two ways to count carbohydrates in food. You can read food labels or learn standard serving sizes of foods. You can use either of the methods or a combination of both. Using the Nutrition Facts label The Nutrition Facts list is included on the labels of almost all packaged foods and beverages in the [place] It includes:  The serving size.  Information about nutrients in each serving, including the grams (g) of carbohydrate per serving. To use the Nutrition Facts:  Decide how many servings you will have.  Multiply the number of servings by the number of carbohydrates per serving.  The resulting number is the total amount of carbohydrates that you will be having. Learning the standard serving sizes of foods When you eat  carbohydrate foods that are not packaged or do not include Nutrition Facts on the label, you need to measure the servings in order to count the amount of carbohydrates.  Measure the foods that you will eat with a food scale or measuring cup, if needed.  Decide how many standard-size servings you will eat.  Multiply the number of servings by 15. For foods that contain carbohydrates, one serving equals 15 g of carbohydrates. ? For example, if you eat 2 cups or 10 oz (300 g) of strawberries, you will have eaten 2 servings and 30 g of carbohydrates (2 servings x 15 g = 30 g).  For foods that have more than one food mixed, such as soups and casseroles, you must count the carbohydrates in each food that is included. The following list contains standard serving sizes of common carbohydrate-rich foods. Each of these servings has about 15 g of carbohydrates:  1 slice of bread.  1 six-inch (15 cm) tortilla.  ? cup or 2 oz (53 g) cooked rice or pasta.   cup or 3 oz (85 g) cooked or canned, drained and rinsed beans or lentils.   cup or 3 oz (85 g) starchy vegetable, such as peas, corn, or squash.   cup or 4 oz (120 g) hot cereal.   cup or 3 oz (85 g) boiled or mashed potatoes, or  or 3 oz (85 g) of a large baked potato.   cup or 4 fl oz (118 mL) fruit juice.  1 cup or 8 fl oz (237 mL) milk.  1 small or 4 oz (106 g) apple.   or 2 oz (63 g) of a medium banana.  1 cup  or 5 oz (150 g) strawberries.  3 cups or 1 oz (24 g) popped popcorn. What is an example of carbohydrate counting? To calculate the number of carbohydrates in this sample meal, follow the steps shown below. Sample meal  3 oz (85 g) chicken breast.  ? cup or 4 oz (106 g) brown rice.   cup or 3 oz (85 g) corn.  1 cup or 8 fl oz (237 mL) milk.  1 cup or 5 oz (150 g) strawberries with sugar-free whipped topping. Carbohydrate calculation 1. Identify the foods that contain  carbohydrates: ? Rice. ? Corn. ? Milk. ? Strawberries. 2. Calculate how many servings you have of each food: ? 2 servings rice. ? 1 serving corn. ? 1 serving milk. ? 1 serving strawberries. 3. Multiply each number of servings by 15 g: ? 2 servings rice x 15 g = 30 g. ? 1 serving corn x 15 g = 15 g. ? 1 serving milk x 15 g = 15 g. ? 1 serving strawberries x 15 g = 15 g. 4. Add together all of the amounts to find the total grams of carbohydrates eaten: ? 30 g + 15 g + 15 g + 15 g = 75 g of carbohydrates total. What are tips for following this plan? Shopping  Develop a meal plan and then make a shopping list.  Buy fresh and frozen vegetables, fresh and frozen fruit, dairy, eggs, beans, lentils, and whole grains.  Look at food labels. Choose foods that have more fiber and less sugar.  Avoid processed foods and foods with added sugars. Meal planning  Aim to have the same amount of carbohydrates at each meal and for each snack time.  Plan to have regular, balanced meals and snacks. Where to find more information  American Diabetes Association: www.diabetes.org  Centers for Disease Control and Prevention: FootballExhibition.com.br Summary  Carbohydrate counting is a method of keeping track of how many carbohydrates you eat.  Eating carbohydrates naturally increases the amount of sugar (glucose) in the blood.  Counting how many carbohydrates you eat improves your blood glucose control, which helps you manage your diabetes.  A dietitian can help you make a meal plan and calculate how many carbohydrates you should have at each meal and snack. This information is not intended to replace advice given to you by your health care provider. Make sure you discuss any questions you have with your health care provider. Document Revised: 10/01/2019 Document Reviewed: 10/02/2019 Elsevier Patient Education  2021 [employer].

## 2021-02-06 NOTE — Progress Notes (Signed)
BP 139/66   Pulse 72   Temp 98.4 F (36.9 C) (Oral)   Wt 167 lb 12.8 oz (76.1 kg)   SpO2 98%   BMI 23.40 kg/m    Subjective:    Patient ID: Stanley Dickerson, male    DOB: [birth year], 60 y.o.   MRN: [id number]  HPI: Stanley Dickerson is a 60 y.o. male  Chief Complaint  Patient presents with  . Diabetes   DIABETES Hypoglycemic episodes:yes Polydipsia/polyuria: yes Visual disturbance: no Chest pain: no Paresthesias: no Glucose Monitoring: yes  Accucheck frequency: BID Taking Insulin?: no Blood Pressure Monitoring: not checking Retinal Examination: Up to Date Foot Exam: Up to Date Diabetic Education: Completed Pneumovax: Not up to Date Influenza: Not up to Date Aspirin: yes  Relevant past medical, surgical, family and social history reviewed and updated as indicated. Interim medical history since our last visit reviewed. Allergies and medications reviewed and updated.  Review of Systems  Constitutional: Negative.   Respiratory: Negative.   Cardiovascular: Negative.   Gastrointestinal: Negative.   Musculoskeletal: Positive for arthralgias. Negative for back pain, gait problem, joint swelling, myalgias, neck pain and neck stiffness.  Skin: Negative.   Neurological: Negative.   Psychiatric/Behavioral: Negative.     Per HPI unless specifically indicated above     Objective:    BP 139/66   Pulse 72   Temp 98.4 F (36.9 C) (Oral)   Wt 167 lb 12.8 oz (76.1 kg)   SpO2 98%   BMI 23.40 kg/m   Wt Readings from Last 3 Encounters:  02/06/21 167 lb 12.8 oz (76.1 kg)  12/08/20 169 lb 9.6 oz (76.9 kg)  11/10/20 170 lb (77.1 kg)    Physical Exam Vitals and nursing note reviewed.  Constitutional:      General: He is not in acute distress.    Appearance: Normal appearance. He is not ill-appearing, toxic-appearing or diaphoretic.  HENT:     Head: Normocephalic and atraumatic.     Right Ear: External ear normal.     Left Ear: External ear normal.     Nose: Nose  normal.     Mouth/Throat:     Mouth: Mucous membranes are moist.     Pharynx: Oropharynx is clear.  Eyes:     General: No scleral icterus.       Right eye: No discharge.        Left eye: No discharge.     Extraocular Movements: Extraocular movements intact.     Conjunctiva/sclera: Conjunctivae normal.     Pupils: Pupils are equal, round, and reactive to light.  Cardiovascular:     Rate and Rhythm: Normal rate and regular rhythm.     Pulses: Normal pulses.     Heart sounds: Normal heart sounds. No murmur heard. No friction rub. No gallop.   Pulmonary:     Effort: Pulmonary effort is normal. No respiratory distress.     Breath sounds: Normal breath sounds. No stridor. No wheezing, rhonchi or rales.  Chest:     Chest wall: No tenderness.  Musculoskeletal:        General: Normal range of motion.     Cervical back: Normal range of motion and neck supple.  Skin:    General: Skin is warm and dry.     Capillary Refill: Capillary refill takes less than 2 seconds.     Coloration: Skin is not jaundiced or pale.     Findings: No bruising, erythema, lesion or rash.  Neurological:  General: No focal deficit present.     Mental Status: He is alert and oriented to person, place, and time. Mental status is at baseline.  Psychiatric:        Mood and Affect: Mood normal.        Behavior: Behavior normal.        Thought Content: Thought content normal.        Judgment: Judgment normal.     Results for orders placed or performed in visit on 12/08/20  Microscopic Examination   Urine  Result Value Ref Range   WBC, UA 0-5 0 - 5 /hpf   RBC 0-2 0 - 2 /hpf   Epithelial Cells (non renal) 0-10 0 - 10 /hpf   Renal Epithel, UA None seen None seen /hpf   Casts None seen None seen /lpf   Cast Type None seen N/A   Crystals None seen N/A   Crystal Type None seen N/A   Mucus, UA None seen Not Estab.   Bacteria, UA Few None seen/Few   Yeast, UA None seen None seen   Trichomonas, UA None seen  None seen  Microalbumin, Urine Waived  Result Value Ref Range   Microalb, Ur Waived 10 0 - 19 mg/L   Creatinine, Urine Waived 100 10 - 300 mg/dL   Microalb/Creat Ratio <30 <30 mg/g  Basic metabolic panel  Result Value Ref Range   Glucose 103 (H) 65 - 99 mg/dL   BUN 18 8 - 27 mg/dL   Creatinine, Ser 6.96 0.76 - 1.27 mg/dL   GFR calc non Af Amer 97 >59 mL/min/1.73   GFR calc Af Amer 112 >59 mL/min/1.73   BUN/Creatinine Ratio 23 10 - 24   Sodium 132 (L) 134 - 144 mmol/L   Potassium 4.5 3.5 - 5.2 mmol/L   Chloride 95 (L) 96 - 106 mmol/L   CO2 22 20 - 29 mmol/L   Calcium 10.2 8.6 - 10.2 mg/dL  Urinalysis, Routine w reflex microscopic  Result Value Ref Range   Specific Gravity, UA 1.020 1.005 - 1.030   pH, UA 7.0 5.0 - 7.5   Color, UA Yellow Yellow   Appearance Ur Clear Clear   Leukocytes,UA Trace (A) Negative   Protein,UA Negative Negative/Trace   Glucose, UA Negative Negative   Ketones, UA Negative Negative   RBC, UA Negative Negative   Bilirubin, UA Negative Negative   Urobilinogen, Ur 0.2 0.2 - 1.0 mg/dL   Nitrite, UA Negative Negative   Microscopic Examination See below:       Assessment & Plan:   Problem List Items Addressed This Visit      Cardiovascular and Mediastinum   Diabetes mellitus with peripheral vascular disease (HCC) - Primary    A1c doing well at 7.3- will continue current regimen. Continue to monitor. Call with any concerns.       Relevant Orders   Bayer DCA Hb A1c Waived   Ambulatory referral to diabetic education       Follow up plan: Return in about 6 months (around 08/08/2021).

## 2021-02-06 NOTE — Assessment & Plan Note (Signed)
A1c doing well at 7.3- will continue current regimen. Continue to monitor. Call with any concerns.

## 2021-02-08 LAB — BAYER DCA HB A1C WAIVED: HB A1C (BAYER DCA - WAIVED): 7.3 % — ABNORMAL HIGH (ref <7.0)

## 2021-04-22 ENCOUNTER — Other Ambulatory Visit: Payer: Self-pay | Admitting: Family Medicine

## 2021-04-22 DIAGNOSIS — E1151 Type 2 diabetes mellitus with diabetic peripheral angiopathy without gangrene: Secondary | ICD-10-CM

## 2021-04-22 DIAGNOSIS — I10 Essential (primary) hypertension: Secondary | ICD-10-CM

## 2021-04-22 NOTE — Telephone Encounter (Signed)
Future in 3 months  

## 2021-07-05 ENCOUNTER — Ambulatory Visit: Payer: Self-pay | Admitting: Family Medicine

## 2021-07-10 ENCOUNTER — Ambulatory Visit: Payer: Self-pay | Admitting: Family Medicine

## 2021-07-23 ENCOUNTER — Other Ambulatory Visit: Payer: Self-pay | Admitting: Family Medicine

## 2021-07-23 DIAGNOSIS — E1151 Type 2 diabetes mellitus with diabetic peripheral angiopathy without gangrene: Secondary | ICD-10-CM

## 2021-07-23 NOTE — Telephone Encounter (Signed)
Requested Prescriptions  Pending Prescriptions Disp Refills  . metFORMIN (GLUCOPHAGE) 500 MG tablet [Pharmacy Med Name: metFORMIN HCl 500 MG Oral Tablet] 120 tablet 0    Sig: TAKE 2 TABLETS BY MOUTH TWICE DAILY WITH MEALS     Endocrinology:  Diabetes - Biguanides Passed - 07/23/2021  9:07 AM      Passed - Cr in normal range and within 360 days    Creatinine, Ser  Date Value Ref Range Status  12/08/2020 0.77 0.76 - 1.27 mg/dL Final    Comment:                   **Effective December 12, 2020 [facility] will begin**                  reporting the 2021 CKD-EPI creatinine equation that                  estimates kidney function without a race variable.          Passed - HBA1C is between 0 and 7.9 and within 180 days    HB A1C (BAYER DCA - WAIVED)  Date Value Ref Range Status  02/06/2021 7.3 (H) <7.0 % Final    Comment:                                          Diabetic Adult            <7.0                                       Healthy Adult        4.3 - 5.7                                                           (DCCT/NGSP) American Diabetes Association's Summary of Glycemic Recommendations for Adults with Diabetes: Hemoglobin A1c <7.0%. More stringent glycemic goals (A1c <6.0%) may further reduce complications at the cost of increased risk of hypoglycemia.          Passed - eGFR in normal range and within 360 days    GFR calc Af Amer  Date Value Ref Range Status  12/08/2020 112 >59 mL/min/1.73 Final    Comment:    **In accordance with recommendations from the NKF-ASN Task force,**   [facility] is in the process of updating its eGFR calculation to the   2021 CKD-EPI creatinine equation that estimates kidney function   without a race variable.    GFR calc non Af Amer  Date Value Ref Range Status  12/08/2020 97 >59 mL/min/1.73 Final         Passed - Valid encounter within last 6 months    Recent Outpatient Visits          5 months ago Diabetes mellitus with peripheral vascular  disease ([place])   [place], Megan P, DO   7 months ago Primary hypertension   [place], Megan P, DO   8 months ago Diabetes mellitus with peripheral vascular disease ([place])   [place], Megan P, DO  1 year ago Essential hypertension   [place], Megan P, DO   1 year ago DM (diabetes mellitus), type 2 with peripheral vascular complications ([place])   [place], Megan P, DO      Future Appointments            In 2 weeks Wynetta Emery, Megan P, DO [place], [facility]           . hydrochlorothiazide (HYDRODIURIL) 25 MG tablet [Pharmacy Med Name: hydroCHLOROthiazide 25 MG Oral Tablet] 30 tablet 0    Sig: Take 1 tablet by mouth once daily     Cardiovascular: Diuretics - Thiazide Failed - 07/23/2021  9:07 AM      Failed - Na in normal range and within 360 days    Sodium  Date Value Ref Range Status  12/08/2020 132 (L) 134 - 144 mmol/L Final         Passed - Ca in normal range and within 360 days    Calcium  Date Value Ref Range Status  12/08/2020 10.2 8.6 - 10.2 mg/dL Final         Passed - Cr in normal range and within 360 days    Creatinine, Ser  Date Value Ref Range Status  12/08/2020 0.77 0.76 - 1.27 mg/dL Final    Comment:                   **Effective December 12, 2020 [facility] will begin**                  reporting the 2021 CKD-EPI creatinine equation that                  estimates kidney function without a race variable.          Passed - K in normal range and within 360 days    Potassium  Date Value Ref Range Status  12/08/2020 4.5 3.5 - 5.2 mmol/L Final         Passed - Last BP in normal range    BP Readings from Last 1 Encounters:  02/06/21 139/66         Passed - Valid encounter within last 6 months    Recent Outpatient Visits          5 months ago Diabetes mellitus with peripheral vascular disease ([place])   [place], Megan P, DO   7 months ago Primary hypertension   [place], Megan P, DO   8 months ago Diabetes mellitus with peripheral vascular disease ([place])   [place], Megan P, DO   1 year ago Essential hypertension   [place], Megan P, DO   1 year ago DM (diabetes mellitus), type 2 with peripheral vascular complications [facility])   [place], Megan P, DO      Future Appointments            In 2 weeks Wynetta Emery, Barb Merino, DO [employer], [facility]

## 2021-08-08 ENCOUNTER — Other Ambulatory Visit: Payer: Self-pay

## 2021-08-08 ENCOUNTER — Encounter: Payer: Self-pay | Admitting: Family Medicine

## 2021-08-08 ENCOUNTER — Ambulatory Visit (INDEPENDENT_AMBULATORY_CARE_PROVIDER_SITE_OTHER): Payer: Self-pay | Admitting: Family Medicine

## 2021-08-08 VITALS — BP 128/62 | HR 73 | Ht 71.0 in | Wt 167.0 lb

## 2021-08-08 DIAGNOSIS — E1151 Type 2 diabetes mellitus with diabetic peripheral angiopathy without gangrene: Secondary | ICD-10-CM

## 2021-08-08 DIAGNOSIS — I10 Essential (primary) hypertension: Secondary | ICD-10-CM

## 2021-08-08 DIAGNOSIS — Z1152 Encounter for screening for COVID-19: Secondary | ICD-10-CM

## 2021-08-08 DIAGNOSIS — Z23 Encounter for immunization: Secondary | ICD-10-CM

## 2021-08-08 DIAGNOSIS — E7800 Pure hypercholesterolemia, unspecified: Secondary | ICD-10-CM

## 2021-08-08 DIAGNOSIS — E785 Hyperlipidemia, unspecified: Secondary | ICD-10-CM

## 2021-08-08 LAB — URINALYSIS, ROUTINE W REFLEX MICROSCOPIC
Bilirubin, UA: NEGATIVE
Ketones, UA: NEGATIVE
Leukocytes,UA: NEGATIVE
Nitrite, UA: NEGATIVE
Protein,UA: NEGATIVE
RBC, UA: NEGATIVE
Specific Gravity, UA: >1.03 — ABNORMAL HIGH (ref 1.005–1.030)
Urobilinogen, Ur: 0.2 mg/dL (ref 0.2–1.0)
pH, UA: 5.5 (ref 5.0–7.5)

## 2021-08-08 LAB — BAYER DCA HB A1C WAIVED: HB A1C (BAYER DCA - WAIVED): 7 % — ABNORMAL HIGH (ref 4.8–5.6)

## 2021-08-08 MED ORDER — METFORMIN HCL 500 MG PO TABS: 1000.0000 mg | ORAL_TABLET | Freq: Two times a day (BID) | ORAL | 1 refills | Status: DC

## 2021-08-08 MED ORDER — HYDROCHLOROTHIAZIDE 25 MG PO TABS: 25.0000 mg | ORAL_TABLET | Freq: Every day | ORAL | 1 refills | Status: DC

## 2021-08-08 MED ORDER — GLYBURIDE 5 MG PO TABS: 5.0000 mg | ORAL_TABLET | Freq: Every day | ORAL | 1 refills | Status: DC

## 2021-08-08 MED ORDER — ATORVASTATIN CALCIUM 40 MG PO TABS: 40.0000 mg | ORAL_TABLET | ORAL | 1 refills | Status: DC

## 2021-08-08 MED ORDER — BENAZEPRIL HCL 40 MG PO TABS: 40.0000 mg | ORAL_TABLET | Freq: Every day | ORAL | 1 refills | Status: DC

## 2021-08-08 NOTE — Assessment & Plan Note (Signed)
Under good control on current regimen. Continue current regimen. Continue to monitor. Call with any concerns. Refills given. Labs drawn today.   

## 2021-08-08 NOTE — Progress Notes (Signed)
BP 128/62   Pulse 73   Ht 5\' 11"  (1.803 m)   Wt 167 lb (75.8 kg)   BMI 23.29 kg/m    Subjective:    Patient ID: Stanley Dickerson, male    DOB: 1961-01-13, 60 y.o.   MRN: [id number]  HPI: Stanley Dickerson is a 60 y.o. male  Chief Complaint  Patient presents with   Diabetes   Hypertension    Nosebleeds and headaches for the last couple months   DIABETES Hypoglycemic episodes:no Polydipsia/polyuria: no Visual disturbance: no Chest pain: no Paresthesias: no Glucose Monitoring: no  Accucheck frequency: Not Checking Taking Insulin?: no Blood Pressure Monitoring: not checking Retinal Examination: Not up to Date Foot Exam: Up to Date Diabetic Education: Not Completed Pneumovax:  Refused Influenza: Given today Aspirin: yes  HYPERTENSION / HYPERLIPIDEMIA Satisfied with current treatment? yes Duration of hypertension: chronic BP monitoring frequency: not checking BP medication side effects: no Past BP meds: HCTZ, benazepril Duration of hyperlipidemia: chronic Cholesterol medication side effects: no Cholesterol supplements: none Past cholesterol medications: atorvastatin Medication compliance: excellent compliance Aspirin: yes Recent stressors: no Recurrent headaches: no Visual changes: no Palpitations: no Dyspnea: no Chest pain: no Lower extremity edema: no Dizzy/lightheaded: no  Relevant past medical, surgical, family and social history reviewed and updated as indicated. Interim medical history since our last visit reviewed. Allergies and medications reviewed and updated.  Review of Systems  Constitutional: Negative.   Respiratory: Negative.    Cardiovascular: Negative.   Gastrointestinal: Negative.   Musculoskeletal: Negative.   Allergic/Immunologic: Positive for environmental allergies. Negative for food allergies and immunocompromised state.  Neurological:  Positive for headaches (1-2x a week). Negative for dizziness, tremors, seizures, syncope, facial  asymmetry, speech difficulty, weakness, light-headedness and numbness.  Psychiatric/Behavioral: Negative.     Per HPI unless specifically indicated above     Objective:    BP 128/62   Pulse 73   Ht 5\' 11"  (1.803 m)   Wt 167 lb (75.8 kg)   BMI 23.29 kg/m   Wt Readings from Last 3 Encounters:  08/08/21 167 lb (75.8 kg)  02/06/21 167 lb 12.8 oz (76.1 kg)  12/08/20 169 lb 9.6 oz (76.9 kg)    Physical Exam Vitals and nursing note reviewed.  Constitutional:      General: He is not in acute distress.    Appearance: Normal appearance. He is not ill-appearing, toxic-appearing or diaphoretic.  HENT:     Head: Normocephalic and atraumatic.     Right Ear: External ear normal.     Left Ear: External ear normal.     Nose: Nose normal.     Mouth/Throat:     Mouth: Mucous membranes are moist.     Pharynx: Oropharynx is clear.  Eyes:     General: No scleral icterus.       Right eye: No discharge.        Left eye: No discharge.     Extraocular Movements: Extraocular movements intact.     Conjunctiva/sclera: Conjunctivae normal.     Pupils: Pupils are equal, round, and reactive to light.  Cardiovascular:     Rate and Rhythm: Normal rate and regular rhythm.     Pulses: Normal pulses.     Heart sounds: Normal heart sounds. No murmur heard.   No friction rub. No gallop.  Pulmonary:     Effort: Pulmonary effort is normal. No respiratory distress.     Breath sounds: Normal breath sounds. No stridor. No wheezing, rhonchi or rales.  Chest:  Chest wall: No tenderness.  Musculoskeletal:        General: Normal range of motion.     Cervical back: Normal range of motion and neck supple.  Skin:    General: Skin is warm and dry.     Capillary Refill: Capillary refill takes less than 2 seconds.     Coloration: Skin is not jaundiced or pale.     Findings: No bruising, erythema, lesion or rash.  Neurological:     General: No focal deficit present.     Mental Status: He is alert and oriented  to person, place, and time. Mental status is at baseline.  Psychiatric:        Mood and Affect: Mood normal.        Behavior: Behavior normal.        Thought Content: Thought content normal.        Judgment: Judgment normal.    Results for orders placed or performed in visit on 08/08/21  Bayer DCA Hb A1c Waived (STAT)  Result Value Ref Range   HB A1C (BAYER DCA - WAIVED) 7.0 (H) 4.8 - 5.6 %  Urinalysis, Routine w reflex microscopic  Result Value Ref Range   Specific Gravity, UA >1.030 (H) 1.005 - 1.030   pH, UA 5.5 5.0 - 7.5   Color, UA Yellow Yellow   Appearance Ur Clear Clear   Leukocytes,UA Negative Negative   Protein,UA Negative Negative/Trace   Glucose, UA Trace (A) Negative   Ketones, UA Negative Negative   RBC, UA Negative Negative   Bilirubin, UA Negative Negative   Urobilinogen, Ur 0.2 0.2 - 1.0 mg/dL   Nitrite, UA Negative Negative      Assessment & Plan:   Problem List Items Addressed This Visit       Cardiovascular and Mediastinum   Hypertension (Chronic)    Under good control on current regimen. Continue current regimen. Continue to monitor. Call with any concerns. Refills given. Labs drawn today.       Relevant Medications   atorvastatin (LIPITOR) 40 MG tablet   benazepril (LOTENSIN) 40 MG tablet   hydrochlorothiazide (HYDRODIURIL) 25 MG tablet   Other Relevant Orders   Urinalysis, Routine w reflex microscopic   Bayer DCA Hb A1c Waived (STAT) (Completed)   Basic Metabolic Panel (BMET)   TSH   Lipid Profile   Diabetes mellitus with peripheral vascular disease (HCC) - Primary    Doing well with A1c of 7.0. Continue to monitor. Call with any concerns. Refills given.       Relevant Medications   atorvastatin (LIPITOR) 40 MG tablet   benazepril (LOTENSIN) 40 MG tablet   glyBURIDE (DIABETA) 5 MG tablet   hydrochlorothiazide (HYDRODIURIL) 25 MG tablet   metFORMIN (GLUCOPHAGE) 500 MG tablet   Other Relevant Orders   Urinalysis, Routine w reflex  microscopic   Bayer DCA Hb A1c Waived (STAT) (Completed)   Basic Metabolic Panel (BMET)   TSH   Lipid Profile     Other   Hyperlipidemia (Chronic)    Under good control on current regimen. Continue current regimen. Continue to monitor. Call with any concerns. Refills given. Labs drawn today.      Relevant Medications   atorvastatin (LIPITOR) 40 MG tablet   benazepril (LOTENSIN) 40 MG tablet   hydrochlorothiazide (HYDRODIURIL) 25 MG tablet   Other Relevant Orders   Urinalysis, Routine w reflex microscopic   Bayer DCA Hb A1c Waived (STAT) (Completed)   Basic Metabolic Panel (BMET)   TSH  Lipid Profile   Urinalysis, Routine w reflex microscopic   Bayer DCA Hb A1c Waived (STAT) (Completed)   Basic Metabolic Panel (BMET)   TSH   Lipid Profile   Other Visit Diagnoses     Essential hypertension       Relevant Medications   atorvastatin (LIPITOR) 40 MG tablet   benazepril (LOTENSIN) 40 MG tablet   hydrochlorothiazide (HYDRODIURIL) 25 MG tablet   Other Relevant Orders   Urinalysis, Routine w reflex microscopic   Bayer DCA Hb A1c Waived (STAT) (Completed)   Basic Metabolic Panel (BMET)   TSH   Lipid Profile   Encounter for screening for COVID-19       Labs drawn today. Await results. Treat as needed.    Relevant Orders   SARS-CoV-2 Semi-Quantitative Total Antibody, Spike   Need for immunization against influenza       Flu shot given today.   Relevant Orders   Flu Vaccine QUAD 81mo+IM (Fluarix, Fluzone & Alfiuria Quad PF) (Completed)   DM (diabetes mellitus), type 2 with peripheral vascular complications (HCC)       Relevant Medications   atorvastatin (LIPITOR) 40 MG tablet   benazepril (LOTENSIN) 40 MG tablet   glyBURIDE (DIABETA) 5 MG tablet   hydrochlorothiazide (HYDRODIURIL) 25 MG tablet   metFORMIN (GLUCOPHAGE) 500 MG tablet        Follow up plan: Return in about 6 months (around 02/06/2022).

## 2021-08-08 NOTE — Assessment & Plan Note (Signed)
Doing well with A1c of 7.0. Continue to monitor. Call with any concerns. Refills given.

## 2021-08-09 LAB — BASIC METABOLIC PANEL
BUN/Creatinine Ratio: 27 — ABNORMAL HIGH (ref 10–24)
BUN: 18 mg/dL (ref 8–27)
CO2: 23 mmol/L (ref 20–29)
Calcium: 10 mg/dL (ref 8.6–10.2)
Chloride: 102 mmol/L (ref 96–106)
Creatinine, Ser: 0.67 mg/dL — ABNORMAL LOW (ref 0.76–1.27)
Glucose: 151 mg/dL — ABNORMAL HIGH (ref 70–99)
Potassium: 4.4 mmol/L (ref 3.5–5.2)
Sodium: 138 mmol/L (ref 134–144)
eGFR: 106 mL/min/{1.73_m2} (ref >59)

## 2021-08-09 LAB — LIPID PANEL
Chol/HDL Ratio: 2.7 ratio (ref 0.0–5.0)
Cholesterol, Total: 112 mg/dL (ref 100–199)
HDL: 42 mg/dL (ref >39)
LDL Chol Calc (NIH): 53 mg/dL (ref 0–99)
Triglycerides: 88 mg/dL (ref 0–149)
VLDL Cholesterol Cal: 17 mg/dL (ref 5–40)

## 2021-08-09 LAB — TSH: TSH: 1.02 u[IU]/mL (ref 0.450–4.500)

## 2021-08-10 ENCOUNTER — Telehealth: Payer: Self-pay | Admitting: Family Medicine

## 2021-08-10 LAB — SARS-COV-2 SEMI-QUANTITATIVE TOTAL ANTIBODY, SPIKE: SARS-CoV-2 Spike Ab Interp: POSITIVE

## 2021-08-10 LAB — SARS-COV-2 SPIKE AB DILUTION: SARS-CoV-2 Spike Ab Dilution: 11403 U/mL (ref <0.8)

## 2021-08-10 NOTE — Telephone Encounter (Signed)
Copied from CRM [id number]. Topic: General - Call Back - No Documentation >> Aug 10, 2021  8:07 AM Crist Infante wrote: Reason for CRM: pt would like a call back concerning his lab results.  Pt says his mychart is down, however, he prefers to get a call back about his results, not put message on mychart.

## 2021-08-10 NOTE — Telephone Encounter (Signed)
Called and LVM asking for patient to please return my call.  

## 2021-08-15 NOTE — Telephone Encounter (Signed)
Called and discussed results with patient. No further questions per patient.

## 2021-09-25 ENCOUNTER — Encounter: Payer: Self-pay | Admitting: Family Medicine

## 2021-09-25 ENCOUNTER — Ambulatory Visit (INDEPENDENT_AMBULATORY_CARE_PROVIDER_SITE_OTHER): Payer: Self-pay | Admitting: Family Medicine

## 2021-09-25 ENCOUNTER — Other Ambulatory Visit: Payer: Self-pay

## 2021-09-25 VITALS — BP 107/60 | HR 73 | Temp 98.3°F | Wt 168.0 lb

## 2021-09-25 DIAGNOSIS — Z1211 Encounter for screening for malignant neoplasm of colon: Secondary | ICD-10-CM

## 2021-09-25 DIAGNOSIS — D500 Iron deficiency anemia secondary to blood loss (chronic): Secondary | ICD-10-CM

## 2021-09-25 DIAGNOSIS — R52 Pain, unspecified: Secondary | ICD-10-CM

## 2021-09-25 LAB — VERITOR FLU A/B WAIVED
Influenza A: NEGATIVE
Influenza B: NEGATIVE

## 2021-09-25 NOTE — Assessment & Plan Note (Signed)
Chronic. Has been supposed to follow up with GI since 2019, but has not. Referral placed again today to [facility] to help with insurance issues. Patient is concerned about colon cancer. Await their input.

## 2021-09-25 NOTE — Progress Notes (Signed)
BP 107/60   Pulse 73   Temp 98.3 F (36.8 C)   Wt 168 lb (76.2 kg)   SpO2 98%   BMI 23.43 kg/m    Subjective:    Patient ID: Stanley Dickerson, male    DOB: 1960/12/31, 60 y.o.   MRN: [id number]  HPI: Stanley Dickerson is a 60 y.o. male  Chief Complaint  Patient presents with   URI    Patient states he has a slight sore throat, low grade temp that lasted 2 hours, and nausea. Symptoms were last night, son is also feeling sick at home. Patient would like to be tested for flu. Patient took an at home COVID tested yesterday and was negative.    bowel concerns    Patient states he is concerned about bowel movements, says one week he'll have loose stools and one week constipated. Patient states he has lower middle back pain since thanksgiving and now pain in this front abdomen area. Patient is concerned about colon cancer.   UPPER RESPIRATORY TRACT INFECTION Duration: Friday Worst symptom: sore throat, congestion Fever: yes Cough: no Shortness of breath: no Wheezing: no Chest pain: no Chest tightness: yes Chest congestion: no Nasal congestion: yes Runny nose: yes Post nasal drip: yes Sneezing: no Sore throat: yes Swollen glands: no Sinus pressure: no Headache: no Face pain: no Toothache: no Ear pain: no  Ear pressure: yes bilateral Eyes red/itching:no Eye drainage/crusting: no  Vomiting: no Rash: no Fatigue: yes Sick contacts: yes Strep contacts: no  Context: better Recurrent sinusitis: no Relief with OTC cold/cough medications: no  Treatments attempted: cold/sinus   As been having lower back pain and RLQ pain. He has had diarrhea and constipation. He is afraid that he has colon cancer. No weight loss. He denies any melena. Mild abdominal pain. No other concerns or complaints at this time.   Relevant past medical, surgical, family and social history reviewed and updated as indicated. Interim medical history since our last visit reviewed. Allergies and medications  reviewed and updated.  Review of Systems  Constitutional:  Positive for chills, diaphoresis, fatigue and fever. Negative for activity change, appetite change and unexpected weight change.  HENT:  Positive for congestion, rhinorrhea and sore throat. Negative for dental problem, drooling, ear discharge, ear pain, facial swelling, hearing loss, mouth sores, nosebleeds, postnasal drip, sinus pressure, sinus pain, sneezing, tinnitus, trouble swallowing and voice change.   Respiratory:  Positive for cough. Negative for apnea, choking, chest tightness, shortness of breath, wheezing and stridor.   Cardiovascular: Negative.   Gastrointestinal: Negative.   Musculoskeletal: Negative.   Skin: Negative.   Neurological: Negative.   Psychiatric/Behavioral: Negative.     Per HPI unless specifically indicated above     Objective:    BP 107/60   Pulse 73   Temp 98.3 F (36.8 C)   Wt 168 lb (76.2 kg)   SpO2 98%   BMI 23.43 kg/m   Wt Readings from Last 3 Encounters:  09/25/21 168 lb (76.2 kg)  08/08/21 167 lb (75.8 kg)  02/06/21 167 lb 12.8 oz (76.1 kg)    Physical Exam Vitals and nursing note reviewed.  Constitutional:      General: He is not in acute distress.    Appearance: Normal appearance. He is normal weight. He is not ill-appearing, toxic-appearing or diaphoretic.  HENT:     Head: Normocephalic and atraumatic.     Right Ear: Tympanic membrane, ear canal and external ear normal.     Left Ear:  Tympanic membrane, ear canal and external ear normal.     Nose: Nose normal. No congestion or rhinorrhea.     Mouth/Throat:     Mouth: Mucous membranes are moist.     Pharynx: Oropharynx is clear. No oropharyngeal exudate or posterior oropharyngeal erythema.  Eyes:     General: No scleral icterus.       Right eye: No discharge.        Left eye: No discharge.     Extraocular Movements: Extraocular movements intact.     Conjunctiva/sclera: Conjunctivae normal.     Pupils: Pupils are equal,  round, and reactive to light.  Neck:     Vascular: No carotid bruit.  Cardiovascular:     Rate and Rhythm: Normal rate and regular rhythm.     Pulses: Normal pulses.     Heart sounds: Normal heart sounds. No murmur heard.   No friction rub. No gallop.  Pulmonary:     Effort: Pulmonary effort is normal. No respiratory distress.     Breath sounds: Normal breath sounds. No stridor. No wheezing, rhonchi or rales.  Chest:     Chest wall: No tenderness.  Musculoskeletal:        General: Normal range of motion.     Cervical back: Normal range of motion and neck supple. No rigidity or tenderness.  Lymphadenopathy:     Cervical: No cervical adenopathy.  Skin:    General: Skin is warm and dry.     Capillary Refill: Capillary refill takes less than 2 seconds.     Coloration: Skin is not jaundiced or pale.     Findings: No bruising, erythema, lesion or rash.  Neurological:     General: No focal deficit present.     Mental Status: He is alert and oriented to person, place, and time. Mental status is at baseline.  Psychiatric:        Mood and Affect: Mood normal.        Behavior: Behavior normal.        Thought Content: Thought content normal.        Judgment: Judgment normal.    Results for orders placed or performed in visit on 08/08/21  Lipid Profile  Result Value Ref Range   Cholesterol, Total 112 100 - 199 mg/dL   Triglycerides 88 0 - 149 mg/dL   HDL 42 >39 mg/dL   VLDL Cholesterol Cal 17 5 - 40 mg/dL   LDL Chol Calc (NIH) 53 0 - 99 mg/dL   Chol/HDL Ratio 2.7 0.0 - 5.0 ratio  Bayer DCA Hb A1c Waived (STAT)  Result Value Ref Range   HB A1C (BAYER DCA - WAIVED) 7.0 (H) 4.8 - 5.6 %  SARS-CoV-2 Semi-Quantitative Total Antibody, Spike  Result Value Ref Range   SARS-CoV-2 Semi-Quant Total Ab See Dilution Negative<0.8 U/mL   SARS-CoV-2 Spike Ab Interp Positive   Urinalysis, Routine w reflex microscopic  Result Value Ref Range   Specific Gravity, UA >1.030 (H) 1.005 - 1.030   pH,  UA 5.5 5.0 - 7.5   Color, UA Yellow Yellow   Appearance Ur Clear Clear   Leukocytes,UA Negative Negative   Protein,UA Negative Negative/Trace   Glucose, UA Trace (A) Negative   Ketones, UA Negative Negative   RBC, UA Negative Negative   Bilirubin, UA Negative Negative   Urobilinogen, Ur 0.2 0.2 - 1.0 mg/dL   Nitrite, UA Negative Negative  Basic metabolic panel  Result Value Ref Range   Glucose 151 (H) 70 -  99 mg/dL   BUN 18 8 - 27 mg/dL   Creatinine, Ser 0.67 (L) 0.76 - 1.27 mg/dL   eGFR 106 >59 mL/min/1.73   BUN/Creatinine Ratio 27 (H) 10 - 24   Sodium 138 134 - 144 mmol/L   Potassium 4.4 3.5 - 5.2 mmol/L   Chloride 102 96 - 106 mmol/L   CO2 23 20 - 29 mmol/L   Calcium 10.0 8.6 - 10.2 mg/dL  TSH  Result Value Ref Range   TSH 1.020 0.450 - 4.500 uIU/mL  SARS-CoV-2 Spike Ab Dilution  Result Value Ref Range   SARS-CoV-2 Spike Ab Dilution 11,403 Negative<0.8 U/mL      Assessment & Plan:   Problem List Items Addressed This Visit       Other   Iron deficiency anemia    Chronic. Has been supposed to follow up with GI since 2019, but has not. Referral placed [facility] today to [facility] to help with insurance issues. Patient is concerned about colon cancer. Await their input.       Relevant Orders   Ambulatory referral to Gastroenterology   Other Visit Diagnoses     Body aches    -  Primary   Flu negative. COVID negative at home. Improving. Continue symptomatic care. Call with any concerns. Continue to monitor.    Relevant Orders   Veritor Flu A/B Waived   Screening for colon cancer       Referral to GI placed today. Encouraged him to call GI to schedule ASAP.    Relevant Orders   Ambulatory referral to Gastroenterology        Follow up plan: Return as scheduled.

## 2021-09-25 NOTE — Patient Instructions (Signed)
[facility] [place]   [address]   [place], [place] [address]   [phone]

## 2021-09-27 ENCOUNTER — Ambulatory Visit: Payer: Self-pay | Admitting: *Deleted

## 2021-09-27 NOTE — Telephone Encounter (Signed)
Does he still need to do a virtual appointment?

## 2021-09-27 NOTE — Telephone Encounter (Signed)
Per agent: "Pt was in Monday 12/12 and tested for flu. He was with his son, who is also sick. His son tested positive for covid yesterday, and Dr Wynetta Emery sent in Rx for antiviral med. Pt now tested positive for covid this morning, and hoping Dr Wynetta Emery wil do the same for him.  Pt having body aches, headache, fatigue, and then all he would say is "same as my son"....  But he said Dr Wynetta Emery told him had to be called in to [place]."   Chief Complaint: Covid positive today Symptoms: Cough, runny nose, headache, fever Frequency:  Pertinent Negatives: Patient denies SOB Disposition: '[]' ED /'[]' Urgent Care (no appt availability in office) / '[]' Appointment(In office/virtual)/ '[]'  [place] [facility]/ '[x]' Home Care/ '[]' Refused Recommended Disposition  Additional Notes: Pt covid positive, tested today. Requesting oral anti-viral be called in "Just like my son." Please advise   Reason for Disposition  [1] [id number] diagnosed by positive lab test (e.g., PCR, rapid self-test kit) AND [2] mild symptoms (e.g., cough, fever, others) AND [5] no complications or SOB  Answer Assessment - Initial Assessment Questions 1. COVID-19 DIAGNOSIS: "Who made your COVID-19 diagnosis?" "Was it confirmed by a positive lab test or self-test?" If not diagnosed by a doctor (or NP/PA), ask "Are there lots of cases (community spread) where you live?" Note: See public health department website, if unsure.     Home test  This AM positive 2. COVID-19 EXPOSURE: "Was there any known exposure to COVID before the symptoms began?" CDC Definition of close contact: within 6 feet (2 meters) for a total of 15 minutes or more over a 24-hour period.      Son 3. ONSET: "When did the COVID-19 symptoms start?"      Unsure  saw Dr Wynetta Emery 09/25/21 4. WORST SYMPTOM: "What is your worst symptom?" (e.g., cough, fever, shortness of breath, muscle aches)     Achy 5. COUGH: "Do you have a cough?" If Yes, ask: "How bad is the cough?"        yes 6. FEVER: "Do you have a fever?" If Yes, ask: "What is your temperature, how was it measured, and when did it start?"     "Off and on, no greater than 101.0 7. RESPIRATORY STATUS: "Describe your breathing?" (e.g., shortness of breath, wheezing, unable to speak)      WNL 8. BETTER-SAME-WORSE: "Are you getting better, staying the same or getting worse compared to yesterday?"  If getting worse, ask, "In what way?"     Same 9. HIGH RISK DISEASE: "Do you have any chronic medical problems?" (e.g., asthma, heart or lung disease, weak immune system, obesity, etc.)     *No Answer* 10. VACCINE: "Have you had the COVID-19 vaccine?" If Yes, ask: "Which one, how many shots, when did you get it?"       2 11. BOOSTER: "Have you received your COVID-19 booster?" If Yes, ask: "Which one and when did you get it?"       2  13. OTHER SYMPTOMS: "Do you have any other symptoms?"  (e.g., chills, fatigue, headache, loss of smell or taste, muscle pain, sore throat)       Runny nose hedaache 14. O2 SATURATION MONITOR:  "Do you use an oxygen saturation monitor (pulse oximeter) at home?" If Yes, ask "What is your reading (oxygen level) today?" "What is your usual oxygen saturation reading?" (e.g., 95%)       NA  Protocols used: Coronavirus (COVID-19) Diagnosed or Suspected-A-AH

## 2021-09-28 NOTE — Telephone Encounter (Signed)
It is my understanding that he got sick on Friday meaning that he is outside the window for medication- is this correct?

## 2021-09-29 ENCOUNTER — Telehealth: Payer: Self-pay

## 2021-09-29 ENCOUNTER — Other Ambulatory Visit: Payer: Self-pay

## 2021-09-29 ENCOUNTER — Encounter: Payer: Self-pay | Admitting: Oncology

## 2021-09-29 MED ORDER — MOLNUPIRAVIR EUA 200MG CAPSULE
4.0000 | ORAL_CAPSULE | Freq: Two times a day (BID) | ORAL | 0 refills | Status: AC
  Filled 2021-09-29: qty 40, 5d supply, fill #0

## 2021-09-29 NOTE — Addendum Note (Signed)
Addended by: Dorcas Carrow on: 09/29/2021 04:59 PM   Modules accepted: Orders

## 2021-09-29 NOTE — Telephone Encounter (Signed)
Called and left patient a detailed VM letting him know Dr. Henriette Combs message. Asked for patient to call back with any questions or concerns.

## 2021-09-29 NOTE — Telephone Encounter (Signed)
Called and spoke to patient. Patient is upset that no one had gotten back to him yet. Apologized to the patient and explained that Dr. Laural Benes was off Wednesday afternoon and we were closed yesterday afternoon.   Patient states that his symptoms started Sunday morning when he woke up. States he tested positive for Covid Wednesday morning. States he has tested since then and tested negative. Patient states that his sore throat, congestion, and cough have gotten worse. Requesting to have an antibiotic before the weekend. Patient asked if he could be called back within the hour and I informed him that I was sending the message to Dr. Laural Benes now and made sure she saw it before we leave. Patient hung up the phone.

## 2021-09-29 NOTE — Telephone Encounter (Signed)
Patient calling to advise that he tested positive for COVID on Wednesday but has since taken two tests and those test are negative. Patient does not believe he has Covid like his son since the tests are now showing as negative. Patient is requesting that an antibiotic be called in. Patient would like the prescription to be called in at [facility] on [address]. Please call patient back to advise.

## 2021-09-29 NOTE — Telephone Encounter (Signed)
Rx at [facility] employee pharmacy

## 2021-09-29 NOTE — Telephone Encounter (Signed)
An antibiotic is not appropriate as he does not have a bacterial infection. I can send him through the antiviral if he got sick on Sunday. He told me he got sick on Friday when he came in for his appointment.

## 2021-12-27 LAB — HM DIABETES EYE EXAM

## 2022-02-05 NOTE — Progress Notes (Signed)
? ?BP 136/74   Pulse 64   Temp 98.1 ?F (36.7 ?C)   Wt 167 lb (75.8 kg)   SpO2 97%   BMI 23.29 kg/m?   ? ?Subjective:  ? ? Patient ID: Stanley Dickerson, male    DOB: Sep 28, 1961, 61 y.o.   MRN: [id number] ? ?HPI: ?Stanley Dickerson is a 61 y.o. male ? ?Chief Complaint  ?Patient presents with  ? Diabetes  ?  Sent request for diabetic eye exam to Dr. Clydene Pugh in [place]   ? Hypertension  ? Hyperlipidemia  ? ?DIABETES ?Hypoglycemic episodes:no ?Polydipsia/polyuria: no ?Visual disturbance: no ?Chest pain: no ?Paresthesias: no ?Glucose Monitoring: no ? Accucheck frequency: Not Checking ?Taking Insulin?: no ?Blood Pressure Monitoring: not checking ?Retinal Examination: Up to Date ?Foot Exam: Up to Date ?Diabetic Education: Completed ?Pneumovax: Up to Date ?Influenza: Up to Date ?Aspirin: yes ? ?HYPERTENSION / HYPERLIPIDEMIA ?Satisfied with current treatment? yes ?Duration of hypertension: chronic ?BP monitoring frequency: not checking ?BP medication side effects: no ?Past BP meds: hctz, benazepril ?Duration of hyperlipidemia: chronic ?Cholesterol medication side effects: no ?Cholesterol supplements: none ?Past cholesterol medications: atorvastatin ?Medication compliance: excellent compliance ?Aspirin: yes ?Recent stressors: no ?Recurrent headaches: no ?Visual changes: no ?Palpitations: no ?Dyspnea: no ?Chest pain: no ?Lower extremity edema: no ?Dizzy/lightheaded: no ? ?ANEMIA ?Anemia status: stable ?Etiology of anemia: iron def ?Duration of anemia treatment: chronic  ?Compliance with treatment: good compliance ?Iron supplementation side effects: no ?Severity of anemia: moderate ?Fatigue: no ?Decreased exercise tolerance: no  ?Dyspnea on exertion: no ?Palpitations: no ?Bleeding: no ?Pica: no ? ? ?Relevant past medical, surgical, family and social history reviewed and updated as indicated. Interim medical history since our last visit reviewed. ?Allergies and medications reviewed and updated. ? ?Review of Systems   ?Constitutional: Negative.   ?Respiratory: Negative.    ?Cardiovascular: Negative.   ?Gastrointestinal: Negative.   ?Musculoskeletal: Negative.   ?Psychiatric/Behavioral: Negative.    ? ?Per HPI unless specifically indicated above ? ?   ?Objective:  ?  ?BP 136/74   Pulse 64   Temp 98.1 ?F (36.7 ?C)   Wt 167 lb (75.8 kg)   SpO2 97%   BMI 23.29 kg/m?   ?Wt Readings from Last 3 Encounters:  ?02/06/22 167 lb (75.8 kg)  ?09/25/21 168 lb (76.2 kg)  ?08/08/21 167 lb (75.8 kg)  ?  ?Physical Exam ?Vitals and nursing note reviewed.  ?Constitutional:   ?   General: He is not in acute distress. ?   Appearance: Normal appearance. He is not ill-appearing, toxic-appearing or diaphoretic.  ?HENT:  ?   Head: Normocephalic and atraumatic.  ?   Right Ear: External ear normal.  ?   Left Ear: External ear normal.  ?   Nose: Nose normal.  ?   Mouth/Throat:  ?   Mouth: Mucous membranes are moist.  ?   Pharynx: Oropharynx is clear.  ?Eyes:  ?   General: No scleral icterus.    ?   Right eye: No discharge.     ?   Left eye: No discharge.  ?   Extraocular Movements: Extraocular movements intact.  ?   Conjunctiva/sclera: Conjunctivae normal.  ?   Pupils: Pupils are equal, round, and reactive to light.  ?Cardiovascular:  ?   Rate and Rhythm: Normal rate and regular rhythm.  ?   Pulses: Normal pulses.  ?   Heart sounds: Normal heart sounds. No murmur heard. ?  No friction rub. No gallop.  ?Pulmonary:  ?   Effort: Pulmonary effort  is normal. No respiratory distress.  ?   Breath sounds: Normal breath sounds. No stridor. No wheezing, rhonchi or rales.  ?Chest:  ?   Chest wall: No tenderness.  ?Musculoskeletal:     ?   General: Normal range of motion.  ?   Cervical back: Normal range of motion and neck supple.  ?Skin: ?   General: Skin is warm and dry.  ?   Capillary Refill: Capillary refill takes less than 2 seconds.  ?   Coloration: Skin is not jaundiced or pale.  ?   Findings: No bruising, erythema, lesion or rash.  ?Neurological:  ?    General: No focal deficit present.  ?   Mental Status: He is alert and oriented to person, place, and time. Mental status is at baseline.  ?Psychiatric:     ?   Mood and Affect: Mood normal.     ?   Behavior: Behavior normal.     ?   Thought Content: Thought content normal.     ?   Judgment: Judgment normal.  ? ? ?Results for orders placed or performed in visit on 02/06/22  ?Bayer DCA Hb A1c Waived  ?Result Value Ref Range  ? HB A1C (BAYER DCA - WAIVED) 7.8 (H) 4.8 - 5.6 %  ?Microalbumin, Urine Waived  ?Result Value Ref Range  ? Microalb, Ur Waived 10 0 - 19 mg/L  ? Creatinine, Urine Waived 50 10 - 300 mg/dL  ? Microalb/Creat Ratio 30-300 (H) <30 mg/g  ? ?   ?Assessment & Plan:  ? ?Problem List Items Addressed This Visit   ? ?  ? Cardiovascular and Mediastinum  ? Hypertension (Chronic)  ?  Under good control on current regimen. Continue current regimen. Continue to monitor. Call with any concerns. Refills given. Labs drawn today.  ? ? ?  ?  ? Relevant Medications  ? hydrochlorothiazide (HYDRODIURIL) 25 MG tablet  ? benazepril (LOTENSIN) 40 MG tablet  ? atorvastatin (LIPITOR) 40 MG tablet  ? Other Relevant Orders  ? Comprehensive metabolic panel  ? Microalbumin, Urine Waived (Completed)  ? Diabetes mellitus with peripheral vascular disease (HCC) - Primary  ?  Going in the wrong direction with A1c of 7.8. Will really work on diet and exercise and recheck 3 months. Call with any concerns.  ? ?  ?  ? Relevant Medications  ? metFORMIN (GLUCOPHAGE) 500 MG tablet  ? hydrochlorothiazide (HYDRODIURIL) 25 MG tablet  ? glyBURIDE (DIABETA) 5 MG tablet  ? benazepril (LOTENSIN) 40 MG tablet  ? atorvastatin (LIPITOR) 40 MG tablet  ? Other Relevant Orders  ? Comprehensive metabolic panel  ? Bayer DCA Hb A1c Waived (Completed)  ? Microalbumin, Urine Waived (Completed)  ? 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  Continue to keep BP, cholesterol and sugars under good control. Continue to  monitor. Call with any concerns.  ? ?  ?  ? Relevant Medications  ? hydrochlorothiazide (HYDRODIURIL) 25 MG tablet  ? benazepril (LOTENSIN) 40 MG tablet  ? atorvastatin (LIPITOR) 40 MG tablet  ?  ? Digestive  ? GERD (gastroesophageal reflux disease)  ?  Under good control on current regimen. Continue current regimen. Continue to monitor. Call with any concerns. Refills given. Labs drawn today.  ? ? ?  ?  ?  ? Other  ? Hyperlipidemia (Chronic)  ?  Under good control on current regimen. Continue current regimen. Continue to monitor. Call with any concerns. Refills given. Labs drawn today. ? ? ?  ?  ?  Relevant Medications  ? hydrochlorothiazide (HYDRODIURIL) 25 MG tablet  ? benazepril (LOTENSIN) 40 MG tablet  ? atorvastatin (LIPITOR) 40 MG tablet  ? Other Relevant Orders  ? Comprehensive metabolic panel  ? Lipid Panel w/o Chol/HDL Ratio  ? Iron deficiency anemia  ?  Rechecking labs today. Await results. Treat as needed.  ? ?  ?  ? Relevant Medications  ? ferrous sulfate 325 (65 FE) MG tablet  ? Other Relevant Orders  ? CBC with Differential/Platelet  ? Comprehensive metabolic panel  ? ?Other Visit Diagnoses   ? ? DM (diabetes mellitus), type 2 with peripheral vascular complications (HCC)      ? Relevant Medications  ? metFORMIN (GLUCOPHAGE) 500 MG tablet  ? hydrochlorothiazide (HYDRODIURIL) 25 MG tablet  ? glyBURIDE (DIABETA) 5 MG tablet  ? benazepril (LOTENSIN) 40 MG tablet  ? atorvastatin (LIPITOR) 40 MG tablet  ? Essential hypertension      ? Relevant Medications  ? hydrochlorothiazide (HYDRODIURIL) 25 MG tablet  ? benazepril (LOTENSIN) 40 MG tablet  ? atorvastatin (LIPITOR) 40 MG tablet  ? ?  ?  ? ?Follow up plan: ?Return in about 3 months (around 05/08/2022). ? ? ? ? ? ?

## 2022-02-06 ENCOUNTER — Encounter: Payer: Self-pay | Admitting: Family Medicine

## 2022-02-06 ENCOUNTER — Ambulatory Visit (INDEPENDENT_AMBULATORY_CARE_PROVIDER_SITE_OTHER): Payer: Self-pay | Admitting: Family Medicine

## 2022-02-06 VITALS — BP 136/74 | HR 64 | Temp 98.1°F | Wt 167.0 lb

## 2022-02-06 DIAGNOSIS — E1151 Type 2 diabetes mellitus with diabetic peripheral angiopathy without gangrene: Secondary | ICD-10-CM

## 2022-02-06 DIAGNOSIS — D500 Iron deficiency anemia secondary to blood loss (chronic): Secondary | ICD-10-CM

## 2022-02-06 DIAGNOSIS — E7800 Pure hypercholesterolemia, unspecified: Secondary | ICD-10-CM

## 2022-02-06 DIAGNOSIS — I70219 Atherosclerosis of native arteries of extremities with intermittent claudication, unspecified extremity: Secondary | ICD-10-CM

## 2022-02-06 DIAGNOSIS — I10 Essential (primary) hypertension: Secondary | ICD-10-CM

## 2022-02-06 DIAGNOSIS — K219 Gastro-esophageal reflux disease without esophagitis: Secondary | ICD-10-CM

## 2022-02-06 LAB — MICROALBUMIN, URINE WAIVED
Creatinine, Urine Waived: 50 mg/dL (ref 10–300)
Microalb, Ur Waived: 10 mg/L (ref 0–19)

## 2022-02-06 LAB — BAYER DCA HB A1C WAIVED: HB A1C (BAYER DCA - WAIVED): 7.8 % — ABNORMAL HIGH (ref 4.8–5.6)

## 2022-02-06 MED ORDER — BENAZEPRIL HCL 40 MG PO TABS: 40.0000 mg | ORAL_TABLET | Freq: Every day | ORAL | 1 refills | Status: DC

## 2022-02-06 MED ORDER — GLYBURIDE 5 MG PO TABS: 5.0000 mg | ORAL_TABLET | Freq: Every day | ORAL | 1 refills | Status: DC

## 2022-02-06 MED ORDER — HYDROCHLOROTHIAZIDE 25 MG PO TABS: 25.0000 mg | ORAL_TABLET | Freq: Every day | ORAL | 1 refills | Status: DC

## 2022-02-06 MED ORDER — ATORVASTATIN CALCIUM 40 MG PO TABS: 40.0000 mg | ORAL_TABLET | ORAL | 1 refills | Status: DC

## 2022-02-06 MED ORDER — FERROUS SULFATE 325 (65 FE) MG PO TABS: 325.0000 mg | ORAL_TABLET | Freq: Two times a day (BID) | ORAL | 12 refills | Status: DC

## 2022-02-06 MED ORDER — METFORMIN HCL 500 MG PO TABS: 1000.0000 mg | ORAL_TABLET | Freq: Two times a day (BID) | ORAL | 1 refills | Status: DC

## 2022-02-06 NOTE — Assessment & Plan Note (Signed)
Continue to keep BP, cholesterol and sugars under good control. Continue to monitor. Call with any concerns.  ?

## 2022-02-06 NOTE — Assessment & Plan Note (Signed)
Under good control on current regimen. Continue current regimen. Continue to monitor. Call with any concerns. Refills given. Labs drawn today.   

## 2022-02-06 NOTE — Assessment & Plan Note (Signed)
Going in the wrong direction with A1c of 7.8. Will really work on diet and exercise and recheck 3 months. Call with any concerns.  ?

## 2022-02-06 NOTE — Assessment & Plan Note (Signed)
Rechecking labs today. Await results. Treat as needed.  °

## 2022-02-07 LAB — CBC WITH DIFFERENTIAL/PLATELET
Basophils Absolute: 0.1 10*3/uL (ref 0.0–0.2)
Basos: 2 %
EOS (ABSOLUTE): 0.2 10*3/uL (ref 0.0–0.4)
Eos: 5 %
Hematocrit: 34.9 % — ABNORMAL LOW (ref 37.5–51.0)
Hemoglobin: 12.1 g/dL — ABNORMAL LOW (ref 13.0–17.7)
Immature Grans (Abs): 0 10*3/uL (ref 0.0–0.1)
Immature Granulocytes: 1 %
Lymphocytes Absolute: 1.1 10*3/uL (ref 0.7–3.1)
Lymphs: 25 %
MCH: 31.9 pg (ref 26.6–33.0)
MCHC: 34.7 g/dL (ref 31.5–35.7)
MCV: 92 fL (ref 79–97)
Monocytes Absolute: 0.4 10*3/uL (ref 0.1–0.9)
Monocytes: 10 %
Neutrophils Absolute: 2.5 10*3/uL (ref 1.4–7.0)
Neutrophils: 57 %
Platelets: 236 10*3/uL (ref 150–450)
RBC: 3.79 x10E6/uL — ABNORMAL LOW (ref 4.14–5.80)
RDW: 13 % (ref 11.6–15.4)
WBC: 4.3 10*3/uL (ref 3.4–10.8)

## 2022-02-07 LAB — COMPREHENSIVE METABOLIC PANEL
ALT: 19 IU/L (ref 0–44)
AST: 16 IU/L (ref 0–40)
Albumin/Globulin Ratio: 1.9 (ref 1.2–2.2)
Albumin: 4.4 g/dL (ref 3.8–4.8)
Alkaline Phosphatase: 56 IU/L (ref 44–121)
BUN/Creatinine Ratio: 27 — ABNORMAL HIGH (ref 10–24)
BUN: 22 mg/dL (ref 8–27)
Bilirubin Total: 0.2 mg/dL (ref 0.0–1.2)
CO2: 19 mmol/L — ABNORMAL LOW (ref 20–29)
Calcium: 10 mg/dL (ref 8.6–10.2)
Chloride: 104 mmol/L (ref 96–106)
Creatinine, Ser: 0.83 mg/dL (ref 0.76–1.27)
Globulin, Total: 2.3 g/dL (ref 1.5–4.5)
Glucose: 119 mg/dL — ABNORMAL HIGH (ref 70–99)
Potassium: 4.8 mmol/L (ref 3.5–5.2)
Sodium: 139 mmol/L (ref 134–144)
Total Protein: 6.7 g/dL (ref 6.0–8.5)
eGFR: 98 mL/min/{1.73_m2} (ref >59)

## 2022-02-07 LAB — LIPID PANEL W/O CHOL/HDL RATIO
Cholesterol, Total: 105 mg/dL (ref 100–199)
HDL: 49 mg/dL (ref >39)
LDL Chol Calc (NIH): 41 mg/dL (ref 0–99)
Triglycerides: 70 mg/dL (ref 0–149)
VLDL Cholesterol Cal: 15 mg/dL (ref 5–40)

## 2022-03-13 ENCOUNTER — Ambulatory Visit: Payer: Self-pay | Admitting: Unknown Physician Specialty

## 2022-03-13 ENCOUNTER — Ambulatory Visit: Payer: Self-pay | Admitting: *Deleted

## 2022-03-13 NOTE — Telephone Encounter (Signed)
Left message to call back to discuss with nurse his questions regarding BP.

## 2022-03-13 NOTE — Telephone Encounter (Signed)
Reason for Disposition  [1] Systolic BP  >= 130 OR Diastolic >= 80 AND [2] taking BP medications  Answer Assessment - Initial Assessment Questions 1. BLOOD PRESSURE: "What is the blood pressure?" "Did you take at least two measurements 5 minutes apart?"     I'm on high BP med.   Yesterday 148/70 something.    This morning 5 Am 156/70.   I came on to work.    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   He said I should call my PCP.    2. ONSET: "When did you take your blood pressure?"     This morning 3. HOW: "How did you obtain the blood pressure?" (e.g., visiting nurse, automatic home BP monitor)     Fire dept. 4. HISTORY: "Do you have a history of high blood pressure?"     Yes 5. MEDICATIONS: "Are you taking any medications for blood pressure?" "Have you missed any doses recently?"     Yes on meds 2 of them.    6. OTHER SYMPTOMS: "Do you have any symptoms?" (e.g., headache, chest pain, blurred vision, difficulty breathing, weakness)     I'm having headaches with the high BP.   The 2nd BP she started me on HCTZ.     I took 2 med of the benapril. 7. PREGNANCY: "Is there any chance you are pregnant?" "When was your last menstrual period?"     N/A  Protocols used: Blood Pressure - High-A-AH  Chief Complaint: elevated BP and headaches Symptoms: headaches Frequency: Having headaches on and off.  Yesterday noticed my BP was elevated Pertinent Negatives: Patient denies dizziness or weakness on one side of his body, slurred speech. Disposition: [] ED /[] Urgent Care (no appt availability in office) / [x] Appointment(In office/virtual)/ []  [place] [facility]/ [] Home Care/ [] Refused Recommended Disposition /[] [place] [facility]/ []  Follow-up with PCP Additional Notes: Appt made for today with , NP at 1:00  He verbalized he was familiar with the s/s of stroke when I started going over them.

## 2022-03-13 NOTE — Telephone Encounter (Signed)
Message from Traci Sermon sent at 03/13/2022  8:22 AM EDT  Summary: BP questions   Pt called in stating his BP has been going up and down, pt did not want to get an appt, just wanted to speak with someone and ask some questions about some changes he may need to do to help with the unlevel readings, please advise.

## 2022-05-08 ENCOUNTER — Encounter: Payer: Self-pay | Admitting: Family Medicine

## 2022-05-08 ENCOUNTER — Ambulatory Visit (INDEPENDENT_AMBULATORY_CARE_PROVIDER_SITE_OTHER): Payer: Self-pay | Admitting: Family Medicine

## 2022-05-08 VITALS — BP 104/55 | HR 61 | Temp 98.4°F | Wt 162.4 lb

## 2022-05-08 DIAGNOSIS — I10 Essential (primary) hypertension: Secondary | ICD-10-CM

## 2022-05-08 DIAGNOSIS — E1151 Type 2 diabetes mellitus with diabetic peripheral angiopathy without gangrene: Secondary | ICD-10-CM

## 2022-05-08 DIAGNOSIS — R350 Frequency of micturition: Secondary | ICD-10-CM

## 2022-05-08 LAB — BAYER DCA HB A1C WAIVED: HB A1C (BAYER DCA - WAIVED): 6.5 % — ABNORMAL HIGH (ref 4.8–5.6)

## 2022-05-08 NOTE — Progress Notes (Signed)
BP (!) 104/55   Pulse 61   Temp 98.4 F (36.9 C)   Wt 162 lb 6.4 oz (73.7 kg)   SpO2 96%   BMI 22.65 kg/m    Subjective:    Patient ID: Stanley Dickerson, male    DOB: [birth year], 61 y.o.   MRN: [id number]  HPI: Stanley Dickerson is a 61 y.o. male  Chief Complaint  Patient presents with   Diabetes   Night Sweats    Patient states the last two nights he's had real bad night sweats, last night he had to change his clothes.    DIABETES Hypoglycemic episodes:no Polydipsia/polyuria: no Visual disturbance: no Chest pain: no Paresthesias: no Glucose Monitoring: yes  Accucheck frequency:  a few times a week Taking Insulin?: no Blood Pressure Monitoring: not checking Retinal Examination: Up to Date Foot Exam: Not up to Date Diabetic Education: Completed Pneumovax: Up to Date Influenza: Up to Date Aspirin: yes  HYPERTENSION Hypertension status: overtreated  Satisfied with current treatment? yes Duration of hypertension: chronic BP monitoring frequency:  not checking BP medication side effects:  no Medication compliance: excellent compliance Previous BP meds:HCTZ, benazepril Aspirin: yes Recurrent headaches: no Visual changes: no Palpitations: no Dyspnea: no Chest pain: no Lower extremity edema: no Dizzy/lightheaded: yes   BPH BPH status: uncontrolled Satisfied with current treatment?: yes Medication side effects: not on anything Medication compliance: excellent compliance Duration: months Nocturia: 2-3x per night Urinary frequency:yes Incomplete voiding: no Urgency: yes Weak urinary stream: yes Straining to start stream: yes Dysuria: no Onset: gradual Severity: mild   Relevant past medical, surgical, family and social history reviewed and updated as indicated. Interim medical history since our last visit reviewed. Allergies and medications reviewed and updated.  Review of Systems  Constitutional: Negative.   Respiratory: Negative.     Cardiovascular: Negative.   Gastrointestinal: Negative.   Psychiatric/Behavioral: Negative.      Per HPI unless specifically indicated above     Objective:    BP (!) 104/55   Pulse 61   Temp 98.4 F (36.9 C)   Wt 162 lb 6.4 oz (73.7 kg)   SpO2 96%   BMI 22.65 kg/m   Wt Readings from Last 3 Encounters:  05/08/22 162 lb 6.4 oz (73.7 kg)  02/06/22 167 lb (75.8 kg)  09/25/21 168 lb (76.2 kg)    Physical Exam Vitals and nursing note reviewed.  Constitutional:      General: He is not in acute distress.    Appearance: Normal appearance. He is not ill-appearing, toxic-appearing or diaphoretic.  HENT:     Head: Normocephalic and atraumatic.     Right Ear: External ear normal.     Left Ear: External ear normal.     Nose: Nose normal.     Mouth/Throat:     Mouth: Mucous membranes are moist.     Pharynx: Oropharynx is clear.  Eyes:     General: No scleral icterus.       Right eye: No discharge.        Left eye: No discharge.     Extraocular Movements: Extraocular movements intact.     Conjunctiva/sclera: Conjunctivae normal.     Pupils: Pupils are equal, round, and reactive to light.  Cardiovascular:     Rate and Rhythm: Normal rate and regular rhythm.     Pulses: Normal pulses.     Heart sounds: Normal heart sounds. No murmur heard.    No friction rub. No gallop.  Pulmonary:  Effort: Pulmonary effort is normal. No respiratory distress.     Breath sounds: Normal breath sounds. No stridor. No wheezing, rhonchi or rales.  Chest:     Chest wall: No tenderness.  Musculoskeletal:        General: Normal range of motion.     Cervical back: Normal range of motion and neck supple.  Skin:    General: Skin is warm and dry.     Capillary Refill: Capillary refill takes less than 2 seconds.     Coloration: Skin is not jaundiced or pale.     Findings: No bruising, erythema, lesion or rash.  Neurological:     General: No focal deficit present.     Mental Status: He is alert  and oriented to person, place, and time. Mental status is at baseline.  Psychiatric:        Mood and Affect: Mood normal.        Behavior: Behavior normal.        Thought Content: Thought content normal.        Judgment: Judgment normal.     Results for orders placed or performed in visit on 05/08/22  Bayer DCA Hb A1c Waived  Result Value Ref Range   HB A1C (BAYER DCA - WAIVED) 6.5 (H) 4.8 - 5.6 %      Assessment & Plan:   Problem List Items Addressed This Visit       Cardiovascular and Mediastinum   Hypertension (Chronic)    BP running low. Will stop his HCTZ and recheck 3 months. Call with any concerns. Continue to monitor.       Diabetes mellitus with peripheral vascular disease (HCC) - Primary    Doing well with A1c of 6.5. Continue current regimen. Continue to monitor. Recheck 3 months.       Relevant Orders   Bayer DCA Hb A1c Waived (Completed)   Other Visit Diagnoses     Urinary frequency       ?BPH vs HCTZ. Stop HCTZ. If not better by next time, consider flomax.         Follow up plan: Return in about 3 months (around 08/08/2022).

## 2022-05-08 NOTE — Patient Instructions (Signed)
STOP HYDROCHLOROTHIAZIDE

## 2022-05-08 NOTE — Assessment & Plan Note (Signed)
Doing well with A1c of 6.5. Continue current regimen. Continue to monitor. Recheck 3 months.

## 2022-05-08 NOTE — Assessment & Plan Note (Signed)
BP running low. Will stop his HCTZ and recheck 3 months. Call with any concerns. Continue to monitor.

## 2022-07-03 ENCOUNTER — Encounter: Payer: Self-pay | Admitting: Family Medicine

## 2022-08-07 ENCOUNTER — Encounter: Payer: Self-pay | Admitting: Family Medicine

## 2022-08-07 ENCOUNTER — Ambulatory Visit (INDEPENDENT_AMBULATORY_CARE_PROVIDER_SITE_OTHER): Payer: Self-pay | Admitting: Family Medicine

## 2022-08-07 VITALS — BP 131/64 | HR 60 | Temp 97.8°F | Wt 165.7 lb

## 2022-08-07 DIAGNOSIS — R2689 Other abnormalities of gait and mobility: Secondary | ICD-10-CM

## 2022-08-07 DIAGNOSIS — R350 Frequency of micturition: Secondary | ICD-10-CM

## 2022-08-07 DIAGNOSIS — Z23 Encounter for immunization: Secondary | ICD-10-CM

## 2022-08-07 DIAGNOSIS — E1151 Type 2 diabetes mellitus with diabetic peripheral angiopathy without gangrene: Secondary | ICD-10-CM

## 2022-08-07 DIAGNOSIS — I10 Essential (primary) hypertension: Secondary | ICD-10-CM

## 2022-08-07 DIAGNOSIS — E7800 Pure hypercholesterolemia, unspecified: Secondary | ICD-10-CM

## 2022-08-07 LAB — MICROALBUMIN, URINE WAIVED
Creatinine, Urine Waived: 50 mg/dL (ref 10–300)
Microalb, Ur Waived: 10 mg/L (ref 0–19)
Microalb/Creat Ratio: <30 mg/g (ref <30)

## 2022-08-07 LAB — BAYER DCA HB A1C WAIVED: HB A1C (BAYER DCA - WAIVED): 6.2 % — ABNORMAL HIGH (ref 4.8–5.6)

## 2022-08-07 MED ORDER — BENAZEPRIL HCL 40 MG PO TABS: 40.0000 mg | ORAL_TABLET | Freq: Every day | ORAL | 1 refills | Status: DC

## 2022-08-07 MED ORDER — FERROUS SULFATE 325 (65 FE) MG PO TABS: 325.0000 mg | ORAL_TABLET | Freq: Two times a day (BID) | ORAL | 12 refills | Status: DC

## 2022-08-07 MED ORDER — METFORMIN HCL 500 MG PO TABS: 1000.0000 mg | ORAL_TABLET | Freq: Two times a day (BID) | ORAL | 1 refills | Status: DC

## 2022-08-07 MED ORDER — GLYBURIDE 5 MG PO TABS: 5.0000 mg | ORAL_TABLET | Freq: Every day | ORAL | 1 refills | Status: DC

## 2022-08-07 NOTE — Assessment & Plan Note (Signed)
Under good control on current regimen. Continue current regimen. Continue to monitor. Call with any concerns. Refills given. Labs drawn today.   

## 2022-08-07 NOTE — Progress Notes (Signed)
BP 131/64   Pulse 60   Temp 97.8 F (36.6 C)   Wt 165 lb 11.2 oz (75.2 kg)   SpO2 97%   BMI 23.11 kg/m    Subjective:    Patient ID: Stanley Dickerson, male    DOB: 04/13/1961, 61 y.o.   MRN: [id number]  HPI: Stanley Dickerson is a 61 y.o. male  Chief Complaint  Patient presents with   Diabetes   Hypertension   Hyperlipidemia   HYPERTENSION / HYPERLIPIDEMIA Satisfied with current treatment? no Duration of hypertension: chronic BP monitoring frequency: few times a week BP medication side effects: no Past BP meds: benazepril Duration of hyperlipidemia: chronic Cholesterol medication side effects: no Cholesterol supplements: none Past cholesterol medications: atorvastatin Medication compliance: excellent compliance Aspirin: yes Recent stressors: no Recurrent headaches: no Visual changes: no Palpitations: no Dyspnea: no Chest pain: no Lower extremity edema: no Dizzy/lightheaded: no  DIABETES Hypoglycemic episodes:no Polydipsia/polyuria: no Visual disturbance: no Chest pain: no Paresthesias: no Glucose Monitoring: no  Accucheck frequency: Not Checking Taking Insulin?: no Blood Pressure Monitoring: a few times a month Retinal Examination: Up to Date Foot Exam: Up to Date Diabetic Education: Completed Pneumovax: Up to Date Influenza: Up to Date Aspirin: yes   Relevant past medical, surgical, family and social history reviewed and updated as indicated. Interim medical history since our last visit reviewed. Allergies and medications reviewed and updated.  Review of Systems  Constitutional: Negative.   Respiratory: Negative.    Cardiovascular: Negative.   Gastrointestinal: Negative.   Musculoskeletal: Negative.   Neurological: Negative.   Psychiatric/Behavioral: Negative.      Per HPI unless specifically indicated above     Objective:    BP 131/64   Pulse 60   Temp 97.8 F (36.6 C)   Wt 165 lb 11.2 oz (75.2 kg)   SpO2 97%   BMI 23.11 kg/m    Wt Readings from Last 3 Encounters:  08/07/22 165 lb 11.2 oz (75.2 kg)  05/08/22 162 lb 6.4 oz (73.7 kg)  02/06/22 167 lb (75.8 kg)    Physical Exam Vitals and nursing note reviewed.  Constitutional:      General: He is not in acute distress.    Appearance: Normal appearance. He is normal weight. He is not ill-appearing, toxic-appearing or diaphoretic.  HENT:     Head: Normocephalic and atraumatic.     Right Ear: External ear normal.     Left Ear: External ear normal.     Nose: Nose normal.     Mouth/Throat:     Mouth: Mucous membranes are moist.     Pharynx: Oropharynx is clear.  Eyes:     General: No scleral icterus.       Right eye: No discharge.        Left eye: No discharge.     Extraocular Movements: Extraocular movements intact.     Conjunctiva/sclera: Conjunctivae normal.     Pupils: Pupils are equal, round, and reactive to light.  Cardiovascular:     Rate and Rhythm: Normal rate and regular rhythm.     Pulses: Normal pulses.     Heart sounds: Normal heart sounds. No murmur heard.    No friction rub. No gallop.  Pulmonary:     Effort: Pulmonary effort is normal. No respiratory distress.     Breath sounds: Normal breath sounds. No stridor. No wheezing, rhonchi or rales.  Chest:     Chest wall: No tenderness.  Musculoskeletal:  General: Normal range of motion.     Cervical back: Normal range of motion and neck supple.  Skin:    General: Skin is warm and dry.     Capillary Refill: Capillary refill takes less than 2 seconds.     Coloration: Skin is not jaundiced or pale.     Findings: No bruising, erythema, lesion or rash.  Neurological:     General: No focal deficit present.     Mental Status: He is alert and oriented to person, place, and time. Mental status is at baseline.  Psychiatric:        Mood and Affect: Mood normal.        Behavior: Behavior normal.        Thought Content: Thought content normal.        Judgment: Judgment normal.     Results  for orders placed or performed in visit on 05/08/22  Bayer DCA Hb A1c Waived  Result Value Ref Range   HB A1C (BAYER DCA - WAIVED) 6.5 (H) 4.8 - 5.6 %      Assessment & Plan:   Problem List Items Addressed This Visit       Cardiovascular and Mediastinum   Hypertension (Chronic)    Under good control on current regimen. Continue current regimen. Continue to monitor. Call with any concerns. Refills given. Labs drawn today.       Relevant Medications   benazepril (LOTENSIN) 40 MG tablet   Other Relevant Orders   Comprehensive metabolic panel   CBC with Differential/Platelet   Microalbumin, Urine Waived   Diabetes mellitus with peripheral vascular disease (HCC) - Primary    Doing great with A1c of 6.2. Continue current regimen. Continue to monitor. Call with any concerns.       Relevant Medications   metFORMIN (GLUCOPHAGE) 500 MG tablet   glyBURIDE (DIABETA) 5 MG tablet   benazepril (LOTENSIN) 40 MG tablet   Other Relevant Orders   Comprehensive metabolic panel   CBC with Differential/Platelet   Bayer DCA Hb A1c Waived   Lipid Panel w/o Chol/HDL Ratio   Microalbumin, Urine Waived   PSA     Other   Hyperlipidemia (Chronic)    Under good control on current regimen. Continue current regimen. Continue to monitor. Call with any concerns. Refills given. Labs drawn today.       Relevant Medications   benazepril (LOTENSIN) 40 MG tablet   Other Relevant Orders   Comprehensive metabolic panel   CBC with Differential/Platelet   Lipid Panel w/o Chol/HDL Ratio   Other Visit Diagnoses     Urinary frequency       PSA checked today.   Relevant Orders   Comprehensive metabolic panel   CBC with Differential/Platelet   PSA   Balance problem       Discussed PT. He would like to think about it- call if he'd like to start it.    Need for influenza vaccination       Flu shot given today.    Relevant Orders   Flu Vaccine QUAD 6+ mos PF IM (Fluarix Quad PF) (Completed)   Need for  diphtheria-tetanus-pertussis (Tdap) vaccine       Tdap given today.    Relevant Orders   Tdap vaccine greater than or equal to 7yo IM (Completed)        Follow up plan: Return in about 6 months (around 02/06/2023).

## 2022-08-07 NOTE — Assessment & Plan Note (Signed)
Doing great with A1c of 6.2. Continue current regimen. Continue to monitor. Call with any concerns.  

## 2022-08-08 LAB — LIPID PANEL W/O CHOL/HDL RATIO
Cholesterol, Total: 120 mg/dL (ref 100–199)
HDL: 41 mg/dL (ref >39)
LDL Chol Calc (NIH): 51 mg/dL (ref 0–99)
Triglycerides: 166 mg/dL — ABNORMAL HIGH (ref 0–149)
VLDL Cholesterol Cal: 28 mg/dL (ref 5–40)

## 2022-08-08 LAB — CBC WITH DIFFERENTIAL/PLATELET
Basophils Absolute: 0.1 10*3/uL (ref 0.0–0.2)
Basos: 2 %
EOS (ABSOLUTE): 0.2 10*3/uL (ref 0.0–0.4)
Eos: 4 %
Hematocrit: 37.4 % — ABNORMAL LOW (ref 37.5–51.0)
Hemoglobin: 12.4 g/dL — ABNORMAL LOW (ref 13.0–17.7)
Immature Grans (Abs): 0 10*3/uL (ref 0.0–0.1)
Immature Granulocytes: 0 %
Lymphocytes Absolute: 1 10*3/uL (ref 0.7–3.1)
Lymphs: 22 %
MCH: 31.2 pg (ref 26.6–33.0)
MCHC: 33.2 g/dL (ref 31.5–35.7)
MCV: 94 fL (ref 79–97)
Monocytes Absolute: 0.5 10*3/uL (ref 0.1–0.9)
Monocytes: 10 %
Neutrophils Absolute: 2.8 10*3/uL (ref 1.4–7.0)
Neutrophils: 62 %
Platelets: 228 10*3/uL (ref 150–450)
RBC: 3.97 x10E6/uL — ABNORMAL LOW (ref 4.14–5.80)
RDW: 12.7 % (ref 11.6–15.4)
WBC: 4.6 10*3/uL (ref 3.4–10.8)

## 2022-08-08 LAB — COMPREHENSIVE METABOLIC PANEL
ALT: 18 IU/L (ref 0–44)
AST: 18 IU/L (ref 0–40)
Albumin/Globulin Ratio: 1.8 (ref 1.2–2.2)
Albumin: 4.4 g/dL (ref 3.9–4.9)
Alkaline Phosphatase: 56 IU/L (ref 44–121)
BUN/Creatinine Ratio: 21 (ref 10–24)
BUN: 16 mg/dL (ref 8–27)
Bilirubin Total: 0.4 mg/dL (ref 0.0–1.2)
CO2: 24 mmol/L (ref 20–29)
Calcium: 9.9 mg/dL (ref 8.6–10.2)
Chloride: 100 mmol/L (ref 96–106)
Creatinine, Ser: 0.78 mg/dL (ref 0.76–1.27)
Globulin, Total: 2.5 g/dL (ref 1.5–4.5)
Glucose: 227 mg/dL — ABNORMAL HIGH (ref 70–99)
Potassium: 4.6 mmol/L (ref 3.5–5.2)
Sodium: 137 mmol/L (ref 134–144)
Total Protein: 6.9 g/dL (ref 6.0–8.5)
eGFR: 100 mL/min/{1.73_m2} (ref >59)

## 2022-08-08 LAB — PSA: Prostate Specific Ag, Serum: 0.3 ng/mL (ref 0.0–4.0)

## 2022-08-10 ENCOUNTER — Ambulatory Visit: Payer: Self-pay | Admitting: Family Medicine

## 2022-08-24 IMAGING — DX DG KNEE COMPLETE 4+V*L*
4 series · 4 of 4 positions shown · non-contrast
Comparison: None.

CLINICAL DATA: Bilateral knee pain

EXAM:
LEFT KNEE - COMPLETE 4+ VIEW

[knee ap]
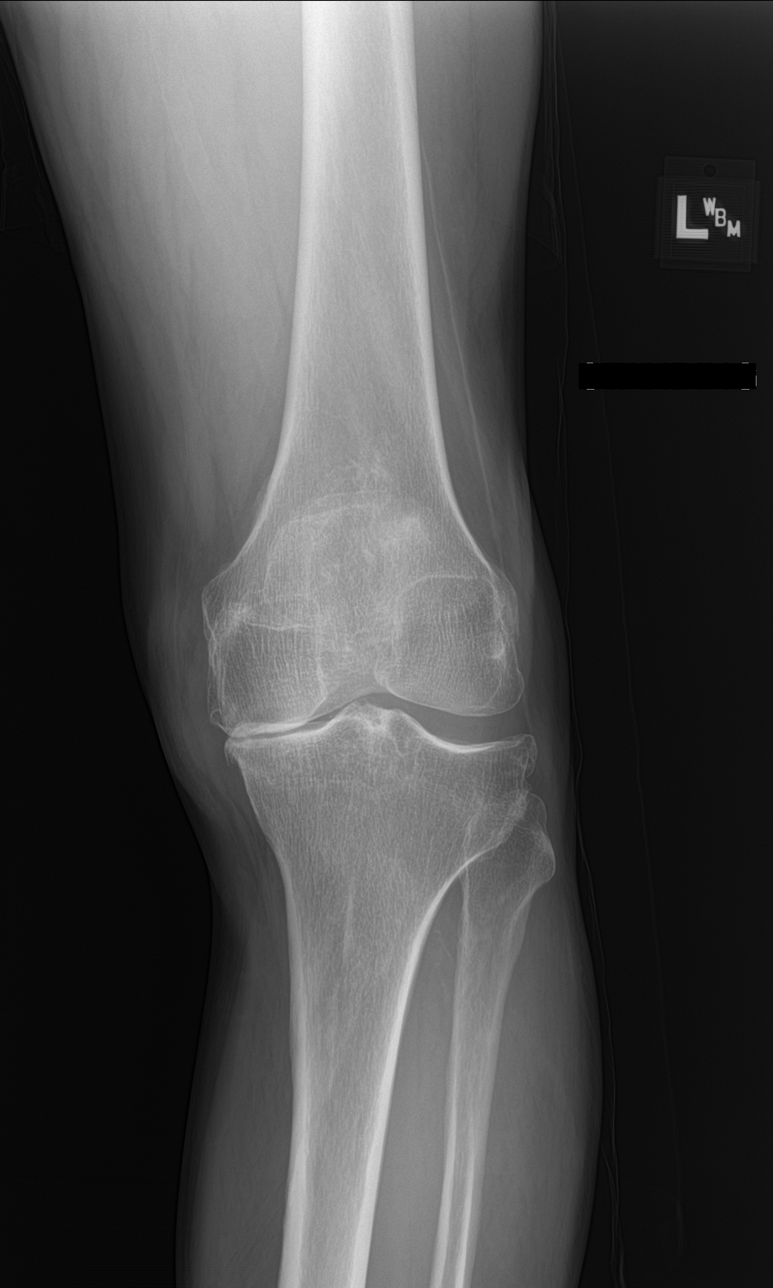

[knee tunnel]
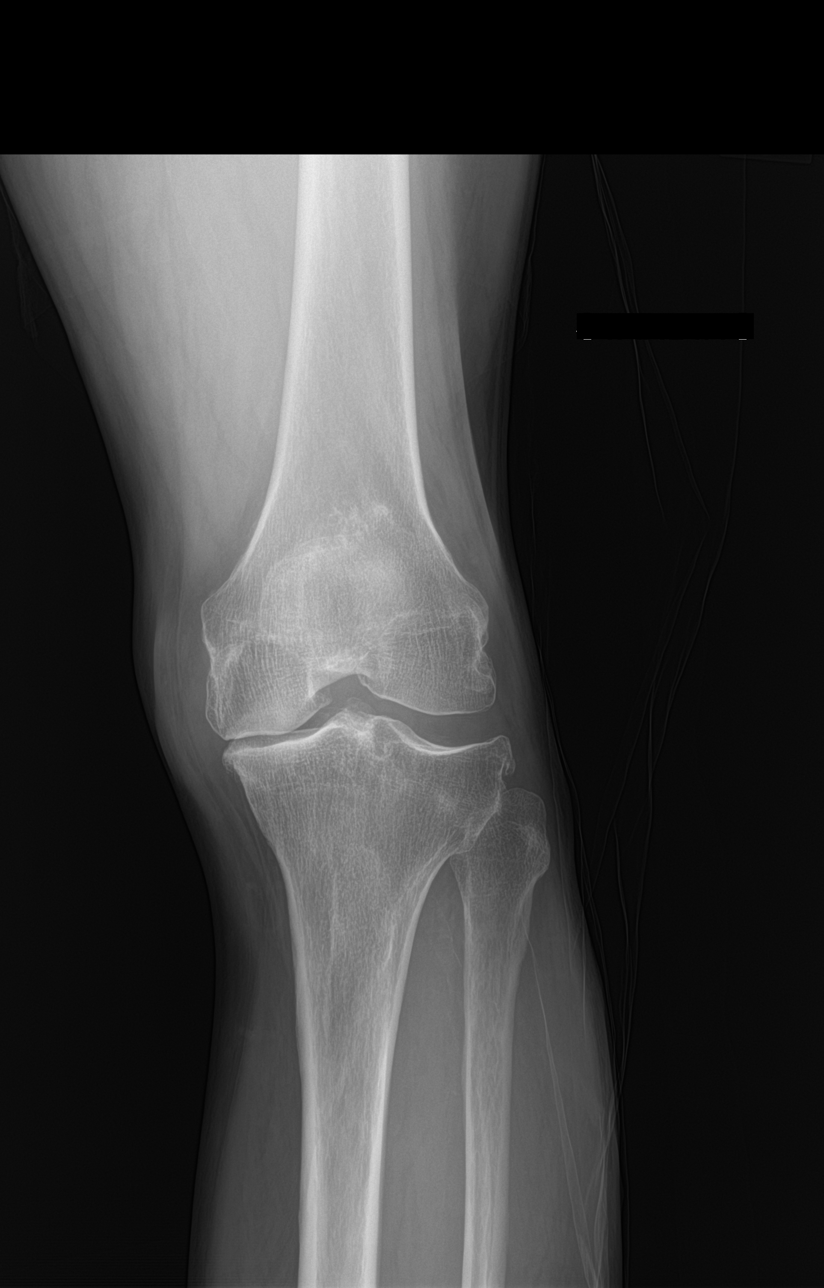

[knee lat]
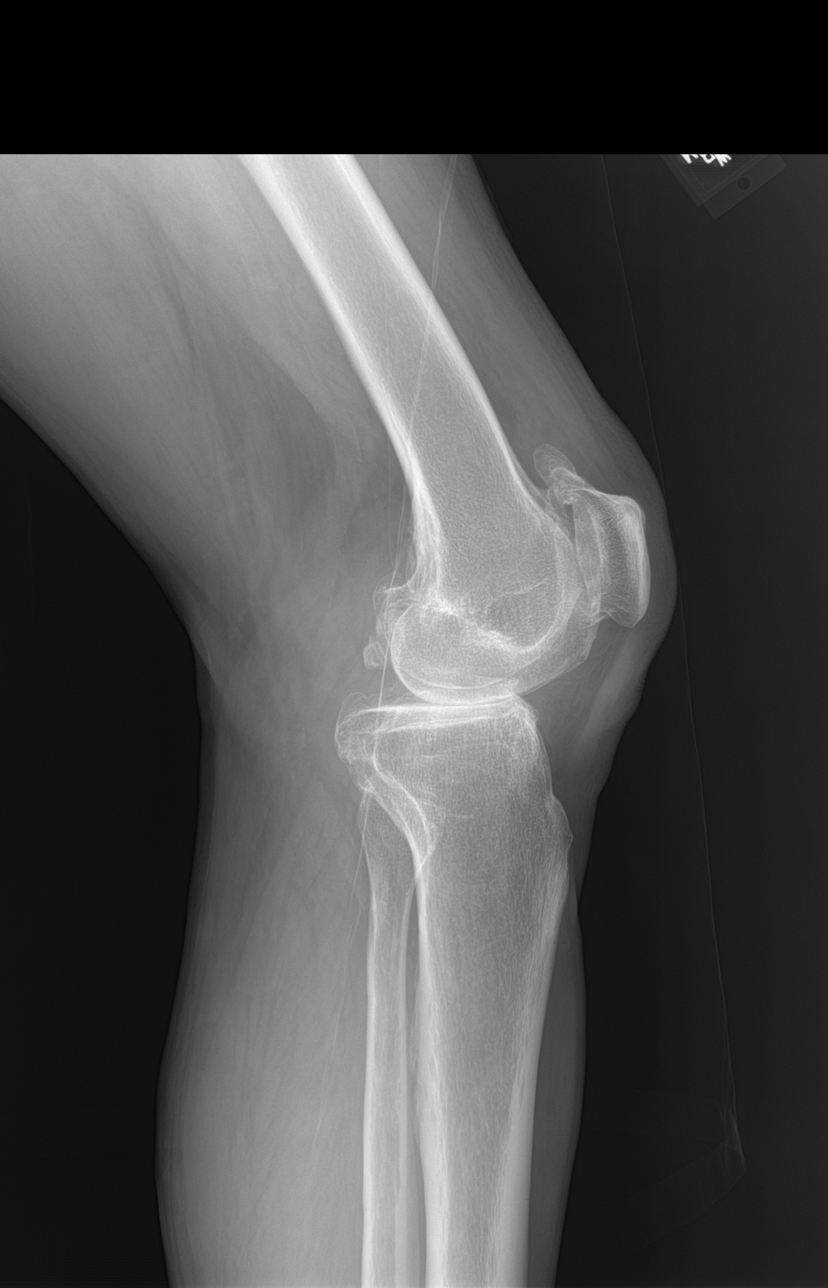

[patella skyline]
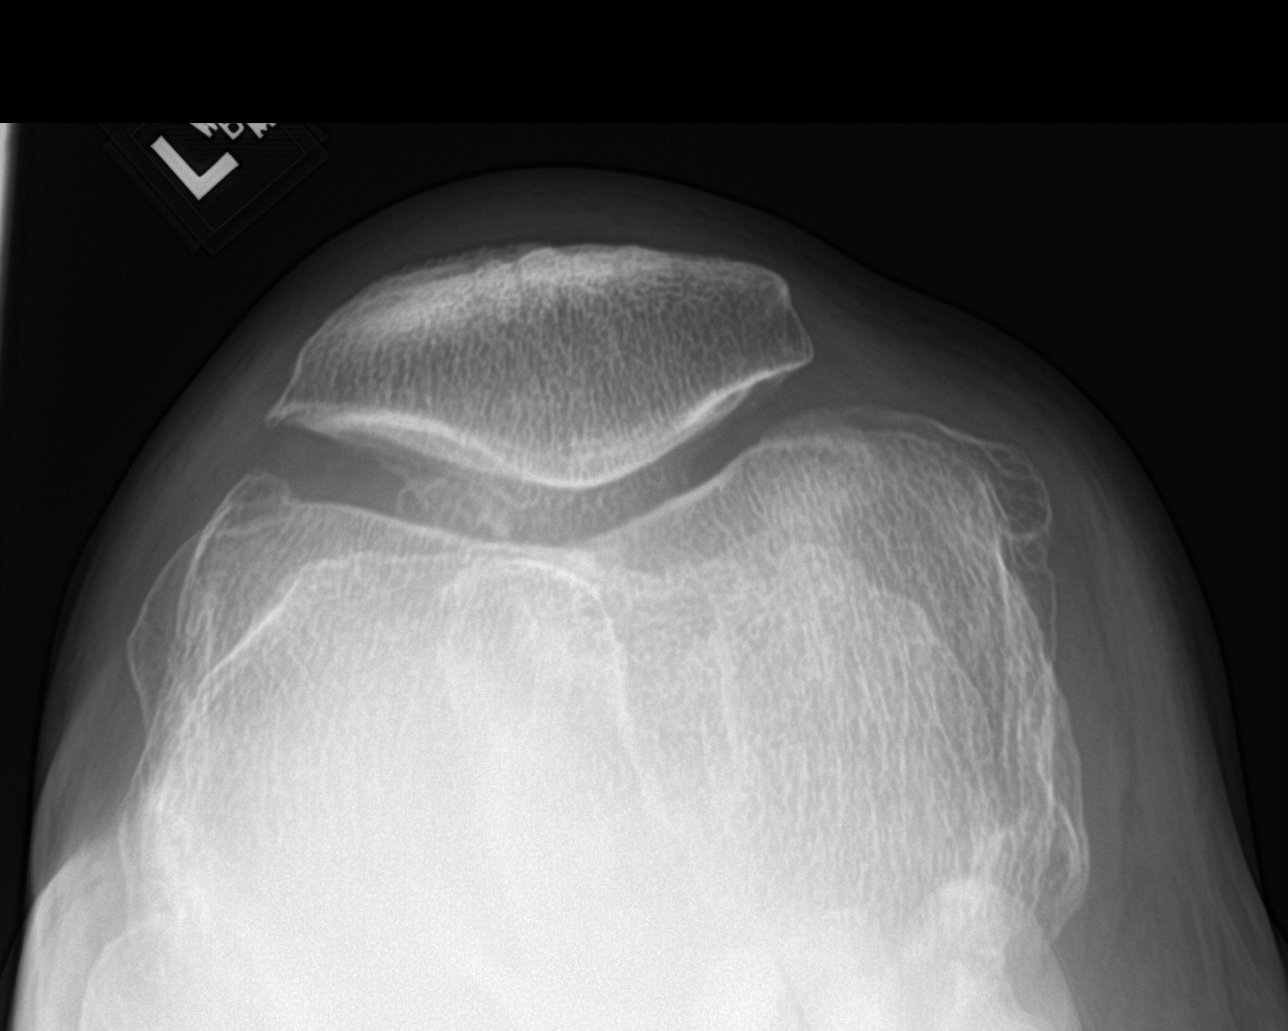

[4 of 4 positions shown; findings below may reference images not displayed]

FINDINGS: Severe degenerative change in the medial joint compartment with
marked joint space narrowing and spurring. Widening of the lateral
joint space. Patellar spurring superiorly. Patellofemoral joint
space intact. Probable calcified loose body posterior to the joint.

Negative for fracture or joint effusion.
IMPRESSION: Severe degenerative change in the medial joint space with widening
of the lateral joint space suggesting ligamentous injury. Probable
loose body in the joint. No fracture.

## 2022-09-08 ENCOUNTER — Other Ambulatory Visit: Payer: Self-pay | Admitting: Family Medicine

## 2022-09-10 NOTE — Telephone Encounter (Signed)
Requested Prescriptions  Pending Prescriptions Disp Refills   atorvastatin (LIPITOR) 40 MG tablet [Pharmacy Med Name: Atorvastatin Calcium 40 MG Oral Tablet] 45 tablet 3    Sig: TAKE 1 TABLET BY MOUTH EVERY OTHER DAY     Cardiovascular:  Antilipid - Statins Failed - 09/08/2022  6:18 AM      Failed - Lipid Panel in normal range within the last 12 months    Cholesterol, Total  Date Value Ref Range Status  08/07/2022 120 100 - 199 mg/dL Final   Cholesterol Piccolo, Waived  Date Value Ref Range Status  03/20/2018 120 <200 mg/dL Final    Comment:                            Desirable                <200                         Borderline High      200- 239                         High                     >239    LDL Chol Calc (NIH)  Date Value Ref Range Status  08/07/2022 51 0 - 99 mg/dL Final   HDL  Date Value Ref Range Status  08/07/2022 41 >39 mg/dL Final   Triglycerides  Date Value Ref Range Status  08/07/2022 166 (H) 0 - 149 mg/dL Final   Triglycerides Piccolo,Waived  Date Value Ref Range Status  03/20/2018 100 <150 mg/dL Final    Comment:                            Normal                   <150                         Borderline High     150 - 199                         High                200 - 499                         Very High                >499          Passed - Patient is not pregnant      Passed - Valid encounter within last 12 months    Recent Outpatient Visits           1 month ago Diabetes mellitus with peripheral vascular disease (HCC)   [facility] Johnson, Megan P, DO   4 months ago Diabetes mellitus with peripheral vascular disease (HCC)   [facility] Johnson, Megan P, DO   7 months ago Diabetes mellitus with peripheral vascular disease (HCC)   [facility] Johnson, Megan P, DO   11 months ago Body aches   [facility] [place], [place], DO   1 year ago Diabetes mellitus  with peripheral  vascular disease [facility])   [facility] Dorcas Carrow, DO       Future Appointments             In 4 months Laural Benes, Oralia Rud, DO [facility]

## 2022-09-28 ENCOUNTER — Ambulatory Visit (INDEPENDENT_AMBULATORY_CARE_PROVIDER_SITE_OTHER): Payer: Self-pay | Admitting: Family Medicine

## 2022-09-28 ENCOUNTER — Encounter: Payer: Self-pay | Admitting: Family Medicine

## 2022-09-28 VITALS — BP 117/61 | HR 65 | Temp 97.9°F | Wt 166.0 lb

## 2022-09-28 DIAGNOSIS — R109 Unspecified abdominal pain: Secondary | ICD-10-CM

## 2022-09-28 LAB — URINALYSIS, ROUTINE W REFLEX MICROSCOPIC
Bilirubin, UA: NEGATIVE
Glucose, UA: NEGATIVE
Ketones, UA: NEGATIVE
Leukocytes,UA: NEGATIVE
Nitrite, UA: NEGATIVE
Protein,UA: NEGATIVE
RBC, UA: NEGATIVE
Specific Gravity, UA: 1.02 (ref 1.005–1.030)
Urobilinogen, Ur: 0.2 mg/dL (ref 0.2–1.0)
pH, UA: 5.5 (ref 5.0–7.5)

## 2022-09-28 MED ORDER — CYCLOBENZAPRINE HCL 10 MG PO TABS: 10.0000 mg | ORAL_TABLET | Freq: Every day | ORAL | 0 refills | Status: DC

## 2022-09-28 MED ORDER — NAPROXEN 500 MG PO TABS: 500.0000 mg | ORAL_TABLET | Freq: Two times a day (BID) | ORAL | 0 refills | Status: DC

## 2022-09-28 NOTE — Progress Notes (Signed)
BP 117/61   Pulse 65   Temp 97.9 F (36.6 C) (Oral)   Wt 166 lb (75.3 kg)   SpO2 97%   BMI 23.15 kg/m    Subjective:    Patient ID: Stanley Dickerson, male    DOB: 08/18/1961, 61 y.o.   MRN: [id number]  HPI: Stanley Dickerson is a 61 y.o. male  Chief Complaint  Patient presents with   Back Pain    Patient says he feels some days the pain feels as if it is radiating to the front of his abdomen from his back. Patient says he would like to discuss he last urine lab results.    GI Problem   BACK PAIN Duration: about a month Mechanism of injury: raking leaves Location: Right and low back Onset: sudden Severity: mild Quality: aching Frequency: intermittent, daily Radiation: into R side of his abdomen Aggravating factors: nothing Alleviating factors: heating pad Status: fluctuating Treatments attempted: heating pad, "pain patch"  Relief with NSAIDs?: No NSAIDs Taken Nighttime pain:  no Paresthesias / decreased sensation:  no Bowel / bladder incontinence:  no Fevers:  no Dysuria / urinary frequency:  yes  Was having bouts of diarrhea, but that was months ago. Has had some pain in his belly after eating nuts. No issues with diarrhea. No blood in his stool.  Relevant past medical, surgical, family and social history reviewed and updated as indicated. Interim medical history since our last visit reviewed. Allergies and medications reviewed and updated.  Review of Systems  Constitutional: Negative.   Respiratory: Negative.    Cardiovascular: Negative.   Gastrointestinal:  Positive for abdominal pain. Negative for abdominal distention, anal bleeding, blood in stool, constipation, diarrhea, nausea, rectal pain and vomiting.  Musculoskeletal: Negative.   Neurological: Negative.   Psychiatric/Behavioral: Negative.      Per HPI unless specifically indicated above     Objective:    BP 117/61   Pulse 65   Temp 97.9 F (36.6 C) (Oral)   Wt 166 lb (75.3 kg)   SpO2 97%    BMI 23.15 kg/m   Wt Readings from Last 3 Encounters:  09/28/22 166 lb (75.3 kg)  08/07/22 165 lb 11.2 oz (75.2 kg)  05/08/22 162 lb 6.4 oz (73.7 kg)    Physical Exam Vitals and nursing note reviewed.  Constitutional:      General: He is not in acute distress.    Appearance: Normal appearance. He is normal weight. He is not ill-appearing, toxic-appearing or diaphoretic.  HENT:     Head: Normocephalic and atraumatic.     Right Ear: External ear normal.     Left Ear: External ear normal.     Nose: Nose normal.     Mouth/Throat:     Mouth: Mucous membranes are moist.     Pharynx: Oropharynx is clear.  Eyes:     General: No scleral icterus.       Right eye: No discharge.        Left eye: No discharge.     Extraocular Movements: Extraocular movements intact.     Conjunctiva/sclera: Conjunctivae normal.     Pupils: Pupils are equal, round, and reactive to light.  Cardiovascular:     Rate and Rhythm: Normal rate and regular rhythm.     Pulses: Normal pulses.     Heart sounds: Normal heart sounds. No murmur heard.    No friction rub. No gallop.  Pulmonary:     Effort: Pulmonary effort is normal. No respiratory distress.  Breath sounds: Normal breath sounds. No stridor. No wheezing, rhonchi or rales.  Chest:     Chest wall: No tenderness.  Abdominal:     General: Abdomen is flat. Bowel sounds are normal. There is no distension.     Palpations: Abdomen is soft. There is no mass.     Tenderness: There is no abdominal tenderness. There is right CVA tenderness. There is no left CVA tenderness, guarding or rebound.     Hernia: No hernia is present.  Musculoskeletal:        General: Tenderness present. No swelling, deformity or signs of injury.     Cervical back: Normal range of motion and neck supple.     Right lower leg: No edema.     Left lower leg: No edema.     Comments: R sided lumbar paraspinal muscle spasm  Skin:    General: Skin is warm and dry.     Capillary Refill:  Capillary refill takes less than 2 seconds.     Coloration: Skin is not jaundiced or pale.     Findings: No bruising, erythema, lesion or rash.  Neurological:     General: No focal deficit present.     Mental Status: He is alert and oriented to person, place, and time. Mental status is at baseline.  Psychiatric:        Mood and Affect: Mood normal.        Behavior: Behavior normal.        Thought Content: Thought content normal.        Judgment: Judgment normal.     Results for orders placed or performed in visit on 08/07/22  Comprehensive metabolic panel  Result Value Ref Range   Glucose 227 (H) 70 - 99 mg/dL   BUN 16 8 - 27 mg/dL   Creatinine, Ser 0.78 0.76 - 1.27 mg/dL   eGFR 100 >59 mL/min/1.73   BUN/Creatinine Ratio 21 10 - 24   Sodium 137 134 - 144 mmol/L   Potassium 4.6 3.5 - 5.2 mmol/L   Chloride 100 96 - 106 mmol/L   CO2 24 20 - 29 mmol/L   Calcium 9.9 8.6 - 10.2 mg/dL   Total Protein 6.9 6.0 - 8.5 g/dL   Albumin 4.4 3.9 - 4.9 g/dL   Globulin, Total 2.5 1.5 - 4.5 g/dL   Albumin/Globulin Ratio 1.8 1.2 - 2.2   Bilirubin Total 0.4 0.0 - 1.2 mg/dL   Alkaline Phosphatase 56 44 - 121 IU/L   AST 18 0 - 40 IU/L   ALT 18 0 - 44 IU/L  CBC with Differential/Platelet  Result Value Ref Range   WBC 4.6 3.4 - 10.8 x10E3/uL   RBC 3.97 (L) 4.14 - 5.80 x10E6/uL   Hemoglobin 12.4 (L) 13.0 - 17.7 g/dL   Hematocrit 37.4 (L) 37.5 - 51.0 %   MCV 94 79 - 97 fL   MCH 31.2 26.6 - 33.0 pg   MCHC 33.2 31.5 - 35.7 g/dL   RDW 12.7 11.6 - 15.4 %   Platelets 228 150 - 450 x10E3/uL   Neutrophils 62 Not Estab. %   Lymphs 22 Not Estab. %   Monocytes 10 Not Estab. %   Eos 4 Not Estab. %   Basos 2 Not Estab. %   Neutrophils Absolute 2.8 1.4 - 7.0 x10E3/uL   Lymphocytes Absolute 1.0 0.7 - 3.1 x10E3/uL   Monocytes Absolute 0.5 0.1 - 0.9 x10E3/uL   EOS (ABSOLUTE) 0.2 0.0 - 0.4 x10E3/uL   Basophils  Absolute 0.1 0.0 - 0.2 x10E3/uL   Immature Granulocytes 0 Not Estab. %   Immature Grans (Abs)  0.0 0.0 - 0.1 x10E3/uL  Bayer DCA Hb A1c Waived  Result Value Ref Range   HB A1C (BAYER DCA - WAIVED) 6.2 (H) 4.8 - 5.6 %  Lipid Panel w/o Chol/HDL Ratio  Result Value Ref Range   Cholesterol, Total 120 100 - 199 mg/dL   Triglycerides 166 (H) 0 - 149 mg/dL   HDL 41 >39 mg/dL   VLDL Cholesterol Cal 28 5 - 40 mg/dL   LDL Chol Calc (NIH) 51 0 - 99 mg/dL  Microalbumin, Urine Waived  Result Value Ref Range   Microalb, Ur Waived 10 0 - 19 mg/L   Creatinine, Urine Waived 50 10 - 300 mg/dL   Microalb/Creat Ratio <30 <30 mg/g  PSA  Result Value Ref Range   Prostate Specific Ag, Serum 0.3 0.0 - 4.0 ng/mL      Assessment & Plan:   Problem List Items Addressed This Visit   None Visit Diagnoses     Flank pain    -  Primary   UA clear. Will treat back pain with flexeril and naproxen and stretches. Call with any concerns. Continue to monitor.   Relevant Orders   Urinalysis, Routine w reflex microscopic        Follow up plan: Return as scheduled.

## 2023-02-06 ENCOUNTER — Ambulatory Visit: Payer: Self-pay | Admitting: Family Medicine

## 2023-02-08 ENCOUNTER — Ambulatory Visit (INDEPENDENT_AMBULATORY_CARE_PROVIDER_SITE_OTHER): Payer: Self-pay | Admitting: Family Medicine

## 2023-02-08 ENCOUNTER — Encounter: Payer: Self-pay | Admitting: Family Medicine

## 2023-02-08 VITALS — BP 119/67 | HR 58 | Temp 98.2°F | Wt 168.9 lb

## 2023-02-08 DIAGNOSIS — I10 Essential (primary) hypertension: Secondary | ICD-10-CM

## 2023-02-08 DIAGNOSIS — E1151 Type 2 diabetes mellitus with diabetic peripheral angiopathy without gangrene: Secondary | ICD-10-CM

## 2023-02-08 DIAGNOSIS — I70219 Atherosclerosis of native arteries of extremities with intermittent claudication, unspecified extremity: Secondary | ICD-10-CM

## 2023-02-08 DIAGNOSIS — E7800 Pure hypercholesterolemia, unspecified: Secondary | ICD-10-CM

## 2023-02-08 LAB — BAYER DCA HB A1C WAIVED: HB A1C (BAYER DCA - WAIVED): 8.5 % — ABNORMAL HIGH (ref 4.8–5.6)

## 2023-02-08 MED ORDER — GLYBURIDE 5 MG PO TABS: 5.0000 mg | ORAL_TABLET | Freq: Every day | ORAL | 1 refills | Status: DC

## 2023-02-08 MED ORDER — ATORVASTATIN CALCIUM 40 MG PO TABS: 40.0000 mg | ORAL_TABLET | ORAL | 3 refills | Status: DC

## 2023-02-08 MED ORDER — BENAZEPRIL HCL 40 MG PO TABS: 40.0000 mg | ORAL_TABLET | Freq: Every day | ORAL | 1 refills | Status: DC

## 2023-02-08 MED ORDER — METFORMIN HCL 500 MG PO TABS: 1000.0000 mg | ORAL_TABLET | Freq: Two times a day (BID) | ORAL | 1 refills | Status: DC

## 2023-02-08 MED ORDER — FERROUS SULFATE 325 (65 FE) MG PO TABS: 325.0000 mg | ORAL_TABLET | Freq: Two times a day (BID) | ORAL | 12 refills | Status: DC

## 2023-02-08 NOTE — Progress Notes (Signed)
BP 119/67   Pulse (!) 58   Temp 98.2 F (36.8 C) (Oral)   Wt 168 lb 14.4 oz (76.6 kg)   SpO2 97%   BMI 23.56 kg/m    Subjective:    Patient ID: Stanley Dickerson, male    DOB: 04-24-1961, 62 y.o.   MRN: [id number]  HPI: Stanley Dickerson is a 62 y.o. male  Chief Complaint  Patient presents with   Diabetes    Patient most recent Diabetic Eye Exam was requested at today's visit.    DIABETES Hypoglycemic episodes:no Polydipsia/polyuria: no Visual disturbance: no Chest pain: no Paresthesias: no Glucose Monitoring: yes  Accucheck frequency: Not Checking Taking Insulin?: no Blood Pressure Monitoring: not checking Retinal Examination: Up to Date Foot Exam: Up to Date Diabetic Education: Completed Pneumovax: Up to Date Influenza: Up to Date Aspirin: no  HYPERTENSION / HYPERLIPIDEMIA Satisfied with current treatment? yes Duration of hypertension: chronic BP monitoring frequency: not checking BP medication side effects: no Past BP meds: none Duration of hyperlipidemia: chronic Cholesterol medication side effects: no Cholesterol supplements: none Past cholesterol medications: atorvastatin Medication compliance: excellent compliance Aspirin: yes Recent stressors: no Recurrent headaches: no Visual changes: no Palpitations: no Dyspnea: no Chest pain: no Lower extremity edema: no Dizzy/lightheaded: no  Relevant past medical, surgical, family and social history reviewed and updated as indicated. Interim medical history since our last visit reviewed. Allergies and medications reviewed and updated.  Review of Systems  Constitutional: Negative.   Respiratory: Negative.    Cardiovascular: Negative.   Gastrointestinal: Negative.   Musculoskeletal: Negative.   Neurological: Negative.   Psychiatric/Behavioral: Negative.      Per HPI unless specifically indicated above     Objective:    BP 119/67   Pulse (!) 58   Temp 98.2 F (36.8 C) (Oral)   Wt 168 lb 14.4  oz (76.6 kg)   SpO2 97%   BMI 23.56 kg/m   Wt Readings from Last 3 Encounters:  02/08/23 168 lb 14.4 oz (76.6 kg)  09/28/22 166 lb (75.3 kg)  08/07/22 165 lb 11.2 oz (75.2 kg)    Physical Exam Vitals and nursing note reviewed.  Constitutional:      General: He is not in acute distress.    Appearance: Normal appearance. He is not ill-appearing, toxic-appearing or diaphoretic.  HENT:     Head: Normocephalic and atraumatic.     Right Ear: External ear normal.     Left Ear: External ear normal.     Nose: Nose normal.     Mouth/Throat:     Mouth: Mucous membranes are moist.     Pharynx: Oropharynx is clear.  Eyes:     General: No scleral icterus.       Right eye: No discharge.        Left eye: No discharge.     Extraocular Movements: Extraocular movements intact.     Conjunctiva/sclera: Conjunctivae normal.     Pupils: Pupils are equal, round, and reactive to light.  Cardiovascular:     Rate and Rhythm: Normal rate and regular rhythm.     Pulses: Normal pulses.     Heart sounds: Normal heart sounds. No murmur heard.    No friction rub. No gallop.  Pulmonary:     Effort: Pulmonary effort is normal. No respiratory distress.     Breath sounds: Normal breath sounds. No stridor. No wheezing, rhonchi or rales.  Chest:     Chest wall: No tenderness.  Musculoskeletal:  General: Normal range of motion.     Cervical back: Normal range of motion and neck supple.  Skin:    General: Skin is warm and dry.     Capillary Refill: Capillary refill takes less than 2 seconds.     Coloration: Skin is not jaundiced or pale.     Findings: No bruising, erythema, lesion or rash.  Neurological:     General: No focal deficit present.     Mental Status: He is alert and oriented to person, place, and time. Mental status is at baseline.  Psychiatric:        Mood and Affect: Mood normal.        Behavior: Behavior normal.        Thought Content: Thought content normal.        Judgment:  Judgment normal.     Results for orders placed or performed in visit on 09/28/22  Urinalysis, Routine w reflex microscopic  Result Value Ref Range   Specific Gravity, UA 1.020 1.005 - 1.030   pH, UA 5.5 5.0 - 7.5   Color, UA Yellow Yellow   Appearance Ur Clear Clear   Leukocytes,UA Negative Negative   Protein,UA Negative Negative/Trace   Glucose, UA Negative Negative   Ketones, UA Negative Negative   RBC, UA Negative Negative   Bilirubin, UA Negative Negative   Urobilinogen, Ur 0.2 0.2 - 1.0 mg/dL   Nitrite, UA Negative Negative   Microscopic Examination Comment       Assessment & Plan:   Problem List Items Addressed This Visit       Cardiovascular and Mediastinum   Hypertension - Primary (Chronic)    Under good control on current regimen. Continue current regimen. Continue to monitor. Call with any concerns. Refills given. Labs drawn today.       Relevant Medications   atorvastatin (LIPITOR) 40 MG tablet   benazepril (LOTENSIN) 40 MG tablet   Other Relevant Orders   CBC with Differential/Platelet   Comprehensive metabolic panel   Diabetes mellitus with peripheral vascular disease (HCC)    Not under good control with A1c of 8.5. To have insurance shortly. Will continue current regimen. Call with any concerns. Continue to monitor.       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 Call with any concerns. Continue to monitor.       Relevant Medications   atorvastatin (LIPITOR) 40 MG tablet   benazepril (LOTENSIN) 40 MG tablet     Other   Hyperlipidemia (Chronic)    Under  good control on current regimen. Continue current regimen. Continue to monitor. Call with any concerns. Refills given. Labs drawn today.        Relevant Medications   atorvastatin (LIPITOR) 40 MG tablet   benazepril (LOTENSIN) 40 MG tablet   Other Relevant Orders   Lipid Panel w/o Chol/HDL Ratio   CBC with Differential/Platelet   Comprehensive metabolic panel     Follow up plan: Return in about 3 months (around 05/10/2023).

## 2023-02-08 NOTE — Assessment & Plan Note (Signed)
Under good control on current regimen. Continue current regimen. Continue to monitor. Call with any concerns. Refills given. Labs drawn today.   

## 2023-02-08 NOTE — Assessment & Plan Note (Signed)
Not under good control with A1c of 8.5. To have insurance shortly. Will continue current regimen. Call with any concerns. Continue to monitor.

## 2023-02-08 NOTE — Assessment & Plan Note (Signed)
Will continue to keep BP, cholesterol and sugars under good control. Call with any concerns. Continue to monitor.

## 2023-02-09 LAB — CBC WITH DIFFERENTIAL/PLATELET
Basophils Absolute: 0.1 10*3/uL (ref 0.0–0.2)
Basos: 1 %
EOS (ABSOLUTE): 0.3 10*3/uL (ref 0.0–0.4)
Eos: 6 %
Hematocrit: 37 % — ABNORMAL LOW (ref 37.5–51.0)
Hemoglobin: 12.2 g/dL — ABNORMAL LOW (ref 13.0–17.7)
Immature Grans (Abs): 0 10*3/uL (ref 0.0–0.1)
Immature Granulocytes: 0 %
Lymphocytes Absolute: 1 10*3/uL (ref 0.7–3.1)
Lymphs: 21 %
MCH: 30.3 pg (ref 26.6–33.0)
MCHC: 33 g/dL (ref 31.5–35.7)
MCV: 92 fL (ref 79–97)
Monocytes Absolute: 0.4 10*3/uL (ref 0.1–0.9)
Monocytes: 9 %
Neutrophils Absolute: 3 10*3/uL (ref 1.4–7.0)
Neutrophils: 63 %
Platelets: 226 10*3/uL (ref 150–450)
RBC: 4.02 x10E6/uL — ABNORMAL LOW (ref 4.14–5.80)
RDW: 13.4 % (ref 11.6–15.4)
WBC: 4.8 10*3/uL (ref 3.4–10.8)

## 2023-02-09 LAB — COMPREHENSIVE METABOLIC PANEL
ALT: 18 IU/L (ref 0–44)
AST: 15 IU/L (ref 0–40)
Albumin/Globulin Ratio: 2.3 — ABNORMAL HIGH (ref 1.2–2.2)
Albumin: 4.6 g/dL (ref 3.9–4.9)
Alkaline Phosphatase: 65 IU/L (ref 44–121)
BUN/Creatinine Ratio: 15 (ref 10–24)
BUN: 12 mg/dL (ref 8–27)
Bilirubin Total: 0.5 mg/dL (ref 0.0–1.2)
CO2: 23 mmol/L (ref 20–29)
Calcium: 10.1 mg/dL (ref 8.6–10.2)
Chloride: 104 mmol/L (ref 96–106)
Creatinine, Ser: 0.82 mg/dL (ref 0.76–1.27)
Globulin, Total: 2 g/dL (ref 1.5–4.5)
Glucose: 124 mg/dL — ABNORMAL HIGH (ref 70–99)
Potassium: 4.4 mmol/L (ref 3.5–5.2)
Sodium: 141 mmol/L (ref 134–144)
Total Protein: 6.6 g/dL (ref 6.0–8.5)
eGFR: 98 mL/min/{1.73_m2} (ref >59)

## 2023-02-09 LAB — LIPID PANEL W/O CHOL/HDL RATIO
Cholesterol, Total: 111 mg/dL (ref 100–199)
HDL: 41 mg/dL (ref >39)
LDL Chol Calc (NIH): 47 mg/dL (ref 0–99)
Triglycerides: 130 mg/dL (ref 0–149)
VLDL Cholesterol Cal: 23 mg/dL (ref 5–40)

## 2023-05-17 ENCOUNTER — Encounter: Payer: Self-pay | Admitting: Family Medicine

## 2023-05-17 ENCOUNTER — Ambulatory Visit (INDEPENDENT_AMBULATORY_CARE_PROVIDER_SITE_OTHER): Payer: Self-pay | Admitting: Family Medicine

## 2023-05-17 VITALS — BP 112/64 | HR 58 | Temp 98.2°F | Wt 165.0 lb

## 2023-05-17 DIAGNOSIS — Z7984 Long term (current) use of oral hypoglycemic drugs: Secondary | ICD-10-CM

## 2023-05-17 DIAGNOSIS — E1151 Type 2 diabetes mellitus with diabetic peripheral angiopathy without gangrene: Secondary | ICD-10-CM

## 2023-05-17 DIAGNOSIS — R040 Epistaxis: Secondary | ICD-10-CM

## 2023-05-17 LAB — BAYER DCA HB A1C WAIVED: HB A1C (BAYER DCA - WAIVED): 7 % — ABNORMAL HIGH (ref 4.8–5.6)

## 2023-05-17 MED ORDER — MUPIROCIN 2 % EX OINT: 1.0000 | TOPICAL_OINTMENT | Freq: Two times a day (BID) | CUTANEOUS | 0 refills | Status: DC

## 2023-05-17 NOTE — Assessment & Plan Note (Signed)
Doing much better with A1c of 7.0. Continue current regimen. Call with any concerns. Continue to monitor.

## 2023-05-17 NOTE — Patient Instructions (Signed)
Ayr gel

## 2023-05-17 NOTE — Progress Notes (Signed)
BP 112/64   Pulse (!) 58   Temp 98.2 F (36.8 C) (Oral)   Wt 165 lb (74.8 kg)   SpO2 97%   BMI 23.01 kg/m    Subjective:    Patient ID: Stanley Dickerson, male    DOB: Sep 14, 1961, 62 y.o.   MRN: [id number]  HPI: Stanley Dickerson is a 62 y.o. male  Chief Complaint  Patient presents with   Diabetes   Epistaxis    Been having frequent nose bleeding   DIABETES Hypoglycemic episodes:no Polydipsia/polyuria: no Visual disturbance: no Chest pain: no Paresthesias: no Glucose Monitoring: yes  Accucheck frequency: Daily Taking Insulin?: no Blood Pressure Monitoring: not checking Retinal Examination: Up to Date Foot Exam: Up to Date Diabetic Education: Completed Pneumovax: Up to Date Influenza:  will get when available Aspirin: no   Relevant past medical, surgical, family and social history reviewed and updated as indicated. Interim medical history since our last visit reviewed. Allergies and medications reviewed and updated.  Review of Systems  Constitutional: Negative.   HENT:  Positive for nosebleeds. Negative for congestion, dental problem, drooling, ear discharge, ear pain, facial swelling, hearing loss, mouth sores, postnasal drip, rhinorrhea, sinus pressure, sinus pain, sneezing, sore throat, tinnitus, trouble swallowing and voice change.   Respiratory: Negative.    Cardiovascular: Negative.   Gastrointestinal: Negative.   Musculoskeletal: Negative.   Psychiatric/Behavioral: Negative.      Per HPI unless specifically indicated above     Objective:    BP 112/64   Pulse (!) 58   Temp 98.2 F (36.8 C) (Oral)   Wt 165 lb (74.8 kg)   SpO2 97%   BMI 23.01 kg/m   Wt Readings from Last 3 Encounters:  05/17/23 165 lb (74.8 kg)  02/08/23 168 lb 14.4 oz (76.6 kg)  09/28/22 166 lb (75.3 kg)    Physical Exam Vitals and nursing note reviewed.  Constitutional:      General: He is not in acute distress.    Appearance: Normal appearance. He is normal weight. He  is not ill-appearing, toxic-appearing or diaphoretic.  HENT:     Head: Normocephalic and atraumatic.     Right Ear: External ear normal.     Left Ear: External ear normal.     Nose: Nose normal.     Mouth/Throat:     Mouth: Mucous membranes are moist.     Pharynx: Oropharynx is clear.  Eyes:     General: No scleral icterus.       Right eye: No discharge.        Left eye: No discharge.     Extraocular Movements: Extraocular movements intact.     Conjunctiva/sclera: Conjunctivae normal.     Pupils: Pupils are equal, round, and reactive to light.  Cardiovascular:     Rate and Rhythm: Normal rate and regular rhythm.     Pulses: Normal pulses.     Heart sounds: Normal heart sounds. No murmur heard.    No friction rub. No gallop.  Pulmonary:     Effort: Pulmonary effort is normal. No respiratory distress.     Breath sounds: Normal breath sounds. No stridor. No wheezing, rhonchi or rales.  Chest:     Chest wall: No tenderness.  Musculoskeletal:        General: Normal range of motion.     Cervical back: Normal range of motion and neck supple.  Skin:    General: Skin is warm and dry.     Capillary Refill: Capillary refill  takes less than 2 seconds.     Coloration: Skin is not jaundiced or pale.     Findings: No bruising, erythema, lesion or rash.  Neurological:     General: No focal deficit present.     Mental Status: He is alert and oriented to person, place, and time. Mental status is at baseline.  Psychiatric:        Mood and Affect: Mood normal.        Behavior: Behavior normal.        Thought Content: Thought content normal.        Judgment: Judgment normal.     Results for orders placed or performed in visit on 02/08/23  Bayer DCA Hb A1c Waived  Result Value Ref Range   HB A1C (BAYER DCA - WAIVED) 8.5 (H) 4.8 - 5.6 %  Lipid Panel w/o Chol/HDL Ratio  Result Value Ref Range   Cholesterol, Total 111 100 - 199 mg/dL   Triglycerides 161 0 - 149 mg/dL   HDL 41 >09 mg/dL    VLDL Cholesterol Cal 23 5 - 40 mg/dL   LDL Chol Calc (NIH) 47 0 - 99 mg/dL  CBC with Differential/Platelet  Result Value Ref Range   WBC 4.8 3.4 - 10.8 x10E3/uL   RBC 4.02 (L) 4.14 - 5.80 x10E6/uL   Hemoglobin 12.2 (L) 13.0 - 17.7 g/dL   Hematocrit 60.4 (L) 54.0 - 51.0 %   MCV 92 79 - 97 fL   MCH 30.3 26.6 - 33.0 pg   MCHC 33.0 31.5 - 35.7 g/dL   RDW 98.1 19.1 - 47.8 %   Platelets 226 150 - 450 x10E3/uL   Neutrophils 63 Not Estab. %   Lymphs 21 Not Estab. %   Monocytes 9 Not Estab. %   Eos 6 Not Estab. %   Basos 1 Not Estab. %   Neutrophils Absolute 3.0 1.4 - 7.0 x10E3/uL   Lymphocytes Absolute 1.0 0.7 - 3.1 x10E3/uL   Monocytes Absolute 0.4 0.1 - 0.9 x10E3/uL   EOS (ABSOLUTE) 0.3 0.0 - 0.4 x10E3/uL   Basophils Absolute 0.1 0.0 - 0.2 x10E3/uL   Immature Granulocytes 0 Not Estab. %   Immature Grans (Abs) 0.0 0.0 - 0.1 x10E3/uL  Comprehensive metabolic panel  Result Value Ref Range   Glucose 124 (H) 70 - 99 mg/dL   BUN 12 8 - 27 mg/dL   Creatinine, Ser 2.95 0.76 - 1.27 mg/dL   eGFR 98 >62 ZH/YQM/5.78   BUN/Creatinine Ratio 15 10 - 24   Sodium 141 134 - 144 mmol/L   Potassium 4.4 3.5 - 5.2 mmol/L   Chloride 104 96 - 106 mmol/L   CO2 23 20 - 29 mmol/L   Calcium 10.1 8.6 - 10.2 mg/dL   Total Protein 6.6 6.0 - 8.5 g/dL   Albumin 4.6 3.9 - 4.9 g/dL   Globulin, Total 2.0 1.5 - 4.5 g/dL   Albumin/Globulin Ratio 2.3 (H) 1.2 - 2.2   Bilirubin Total 0.5 0.0 - 1.2 mg/dL   Alkaline Phosphatase 65 44 - 121 IU/L   AST 15 0 - 40 IU/L   ALT 18 0 - 44 IU/L      Assessment & Plan:   Problem List Items Addressed This Visit       Cardiovascular and Mediastinum   Diabetes mellitus with peripheral vascular disease (HCC) - Primary    Doing much better with A1c of 7.0. Continue current regimen. Call with any concerns. Continue to monitor.  Relevant Orders   Bayer DCA Hb A1c Waived   Other Visit Diagnoses     Frequent nosebleeds       Will treat with bactroban and AYR gel.  Call with any concerns. Continue to monitor.        Follow up plan: Return in about 3 months (around 08/17/2023).

## 2023-05-29 ENCOUNTER — Encounter: Payer: Self-pay | Admitting: Family Medicine

## 2023-08-10 ENCOUNTER — Other Ambulatory Visit: Payer: Self-pay | Admitting: Family Medicine

## 2023-08-10 DIAGNOSIS — E1151 Type 2 diabetes mellitus with diabetic peripheral angiopathy without gangrene: Secondary | ICD-10-CM

## 2023-08-10 DIAGNOSIS — I10 Essential (primary) hypertension: Secondary | ICD-10-CM

## 2023-08-12 NOTE — Telephone Encounter (Signed)
Requested Prescriptions  Pending Prescriptions Disp Refills   metFORMIN (GLUCOPHAGE) 500 MG tablet [Pharmacy Med Name: metFORMIN HCl 500 MG Oral Tablet] 360 tablet 0    Sig: TAKE 2 TABLETS BY MOUTH TWICE DAILY WITH A MEAL     Endocrinology:  Diabetes - Biguanides Failed - 08/10/2023  7:01 AM      Failed - B12 Level in normal range and within 720 days    Vitamin B-12  Date Value Ref Range Status  07/14/2018 447 180 - 914 pg/mL Final    Comment:    (NOTE) This assay is not validated for testing neonatal or myeloproliferative syndrome specimens for Vitamin B12 levels. Performed at [facility], [address] [place], [place] [address]          Passed - Cr in normal range and within 360 days    Creatinine, Ser  Date Value Ref Range Status  02/08/2023 0.82 0.76 - 1.27 mg/dL Final         Passed - HBA1C is between 0 and 7.9 and within 180 days    HB A1C (BAYER DCA - WAIVED)  Date Value Ref Range Status  05/17/2023 7.0 (H) 4.8 - 5.6 % Final    Comment:             Prediabetes: 5.7 - 6.4          Diabetes: >6.4          Glycemic control for adults with diabetes: <7.0          Passed - eGFR in normal range and within 360 days    GFR calc Af Amer  Date Value Ref Range Status  12/08/2020 112 >59 mL/min/1.73 Final    Comment:    **In accordance with recommendations from the NKF-ASN Task force,**   [facility] is in the process of updating its eGFR calculation to the   2021 CKD-EPI creatinine equation that estimates kidney function   without a race variable.    GFR calc non Af Amer  Date Value Ref Range Status  12/08/2020 97 >59 mL/min/1.73 Final   eGFR  Date Value Ref Range Status  02/08/2023 98 >59 mL/min/1.73 Final         Passed - Valid encounter within last 6 months    Recent Outpatient Visits           2 months ago Diabetes mellitus with peripheral vascular disease (HCC)   [place] [facility] [place], Megan P, DO   6 months ago Primary  hypertension   [place] [facility] [place], Megan P, DO   10 months ago Flank pain   [place] [facility] [place], [place] P, DO   1 year ago Diabetes mellitus with peripheral vascular disease [facility])   [place] [facility] [place], Megan P, DO   1 year ago Diabetes mellitus with peripheral vascular disease [facility]   [place] [facility] [place], [place], DO       Future Appointments             In 1 week Johnson, Oralia Rud, DO [facility]            Passed - CBC within normal limits and completed in the last 12 months    WBC  Date Value Ref Range Status  02/08/2023 4.8 3.4 - 10.8 x10E3/uL Final  07/14/2018 6.0 3.8 - 10.6 K/uL Final   RBC  Date Value  Ref Range Status  02/08/2023 4.02 (L) 4.14 - 5.80 x10E6/uL Final  07/14/2018 4.33 (L) 4.40 - 5.90 MIL/uL Final   Hemoglobin  Date Value Ref Range Status  02/08/2023 12.2 (L) 13.0 - 17.7 g/dL Final   Hematocrit  Date Value Ref Range Status  02/08/2023 37.0 (L) 37.5 - 51.0 % Final   MCHC  Date Value Ref Range Status  02/08/2023 33.0 31.5 - 35.7 g/dL Final  16/07/9603 54.0 32.0 - 36.0 g/dL Final   [facility]  Date Value Ref Range Status  02/08/2023 30.3 26.6 - 33.0 pg Final  07/14/2018 30.4 26.0 - 34.0 pg Final   MCV  Date Value Ref Range Status  02/08/2023 92 79 - 97 fL Final   No results found for: "PLTCOUNTKUC", "LABPLAT", "POCPLA" RDW  Date Value Ref Range Status  02/08/2023 13.4 11.6 - 15.4 % Final          benazepril (LOTENSIN) 40 MG tablet [Pharmacy Med Name: Benazepril HCl 40 MG Oral Tablet] 90 tablet 0    Sig: Take 1 tablet by mouth once daily     Cardiovascular:  ACE Inhibitors Failed - 08/10/2023  7:01 AM      Failed - Cr in normal range and within 180 days    Creatinine, Ser  Date Value Ref Range Status  02/08/2023 0.82 0.76 - 1.27 mg/dL Final         Failed - K in normal range and within 180 days    Potassium   Date Value Ref Range Status  02/08/2023 4.4 3.5 - 5.2 mmol/L Final         Passed - Patient is not pregnant      Passed - Last BP in normal range    BP Readings from Last 1 Encounters:  05/17/23 112/64         Passed - Valid encounter within last 6 months    Recent Outpatient Visits           2 months ago Diabetes mellitus with peripheral vascular disease (HCC)   [place] [facility] [place], Megan P, DO   6 months ago Primary hypertension   [place] [facility] [place], Megan P, DO   10 months ago Flank pain   [place] [facility] [place], [place], DO   1 year ago Diabetes mellitus with peripheral vascular disease [facility])   [place] [facility] [place], Megan P, DO   1 year ago Diabetes mellitus with peripheral vascular disease [facility])   [place] [facility] [place] Carrow, DO       Future Appointments             In 1 week Dorcas Carrow, DO  [facility]

## 2023-08-20 ENCOUNTER — Encounter: Payer: Self-pay | Admitting: Family Medicine

## 2023-08-20 ENCOUNTER — Ambulatory Visit (INDEPENDENT_AMBULATORY_CARE_PROVIDER_SITE_OTHER): Payer: Self-pay | Admitting: Family Medicine

## 2023-08-20 VITALS — BP 118/65 | HR 70 | Ht 69.0 in | Wt 163.8 lb

## 2023-08-20 DIAGNOSIS — Z7984 Long term (current) use of oral hypoglycemic drugs: Secondary | ICD-10-CM

## 2023-08-20 DIAGNOSIS — I10 Essential (primary) hypertension: Secondary | ICD-10-CM

## 2023-08-20 DIAGNOSIS — Z125 Encounter for screening for malignant neoplasm of prostate: Secondary | ICD-10-CM

## 2023-08-20 DIAGNOSIS — E7800 Pure hypercholesterolemia, unspecified: Secondary | ICD-10-CM

## 2023-08-20 DIAGNOSIS — F419 Anxiety disorder, unspecified: Secondary | ICD-10-CM

## 2023-08-20 DIAGNOSIS — E1151 Type 2 diabetes mellitus with diabetic peripheral angiopathy without gangrene: Secondary | ICD-10-CM

## 2023-08-20 DIAGNOSIS — D500 Iron deficiency anemia secondary to blood loss (chronic): Secondary | ICD-10-CM

## 2023-08-20 DIAGNOSIS — R300 Dysuria: Secondary | ICD-10-CM

## 2023-08-20 LAB — URINALYSIS, ROUTINE W REFLEX MICROSCOPIC
Bilirubin, UA: NEGATIVE
Glucose, UA: NEGATIVE
Ketones, UA: NEGATIVE
Nitrite, UA: NEGATIVE
Protein,UA: NEGATIVE
RBC, UA: NEGATIVE
Specific Gravity, UA: 1.015 (ref 1.005–1.030)
Urobilinogen, Ur: 0.2 mg/dL (ref 0.2–1.0)
pH, UA: 6 (ref 5.0–7.5)

## 2023-08-20 LAB — MICROALBUMIN, URINE WAIVED
Creatinine, Urine Waived: 50 mg/dL (ref 10–300)
Microalb, Ur Waived: 10 mg/L (ref 0–19)

## 2023-08-20 LAB — BAYER DCA HB A1C WAIVED: HB A1C (BAYER DCA - WAIVED): 7.1 % — ABNORMAL HIGH (ref 4.8–5.6)

## 2023-08-20 LAB — MICROSCOPIC EXAMINATION
Bacteria, UA: NONE SEEN
Epithelial Cells (non renal): NONE SEEN /[HPF] (ref 0–10)
RBC, Urine: NONE SEEN /[HPF] (ref 0–2)

## 2023-08-20 MED ORDER — ATORVASTATIN CALCIUM 40 MG PO TABS: 40.0000 mg | ORAL_TABLET | ORAL | 1 refills | Status: DC

## 2023-08-20 MED ORDER — FERROUS SULFATE 325 (65 FE) MG PO TABS: 325.0000 mg | ORAL_TABLET | Freq: Two times a day (BID) | ORAL | 4 refills | Status: DC

## 2023-08-20 MED ORDER — ESCITALOPRAM OXALATE 5 MG PO TABS: 2.5000 mg | ORAL_TABLET | Freq: Every day | ORAL | 3 refills | Status: DC

## 2023-08-20 MED ORDER — METFORMIN HCL 500 MG PO TABS: 1000.0000 mg | ORAL_TABLET | Freq: Two times a day (BID) | ORAL | 1 refills | Status: DC

## 2023-08-20 MED ORDER — GLYBURIDE 5 MG PO TABS: 5.0000 mg | ORAL_TABLET | Freq: Every day | ORAL | 1 refills | Status: DC

## 2023-08-20 MED ORDER — BENAZEPRIL HCL 40 MG PO TABS: 40.0000 mg | ORAL_TABLET | Freq: Every day | ORAL | 1 refills | Status: DC

## 2023-08-20 NOTE — Assessment & Plan Note (Signed)
Labs drawn today. Await results. Treat as needed.  

## 2023-08-20 NOTE — Assessment & Plan Note (Signed)
Will start lexapro. Call with any concerns. Continue to monitor. Recheck 1 month.

## 2023-08-20 NOTE — Assessment & Plan Note (Signed)
Under good control on current regimen. Continue current regimen. Continue to monitor. Call with any concerns. Refills given. Labs drawn today.   

## 2023-08-20 NOTE — Progress Notes (Signed)
BP 118/65   Pulse 70   Ht 5\' 9"  (1.753 m)   Wt 163 lb 12.8 oz (74.3 kg)   SpO2 96%   BMI 24.19 kg/m    Subjective:    Patient ID: Stanley Dickerson, male    DOB: 29-Apr-1961, 62 y.o.   MRN: [id number]  HPI: Stanley Dickerson is a 62 y.o. male  Chief Complaint  Patient presents with   Diabetes    Patient says he has an upcoming Diabetic Eye Exam.    Urinary Tract Infection    Patient says he has noticed some lower back pain and sometimes radiates around the front of his abdomen. Patient says he is not sure if it is age related, but he is notices frequency with urination.    Anxiety    Patient says he would like to discuss medication/treatment for Anxiety with the provider at today's visit. 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    ANXIETY/DEPRESSION Duration: a couple of months Status:uncontrolled Anxious mood: yes  Excessive worrying: yes Irritability: yes  Sweating: no Nausea: yes Palpitations:no Hyperventilation: no Panic attacks: no Agoraphobia: no  Obscessions/compulsions: no Depressed mood: no    08/20/2023    8:53 AM 05/17/2023    9:35 AM 09/28/2022   10:35 AM 08/07/2022   11:08 AM 05/08/2022    9:50 AM  Depression screen PHQ 2/9  Decreased Interest 0 0 0 0 0  Down, Depressed, Hopeless 0 0 0 0 0  PHQ - 2 Score 0 0 0 0 0  Altered sleeping 1 1 1 1  0  Tired, decreased energy 1 1 0 1 1  Change in appetite 1 1 0 0 0  Feeling bad or failure about yourself  0 0 0 0 0  Trouble concentrating 1 0 0 1 1  Moving slowly or fidgety/restless 0 0 0 0 0  Suicidal thoughts 0 0 0 0 0  PHQ-9 Score 4 3 1 3 2   Difficult doing work/chores Somewhat difficult Somewhat difficult Not difficult at all Not difficult at all Not difficult at all      08/20/2023    8:53 AM 05/17/2023    9:35 AM 09/28/2022   10:35 AM 08/07/2022   11:09 AM  GAD 7 : Generalized Anxiety Score  Nervous, Anxious, on Edge 1  1 1  0  Control/stop worrying 0 0 0 0  Worry too much - different things 0 0 0 0  Trouble relaxing 1 1 1 1   Restless 1 1 0 1  Easily annoyed or irritable 1 1 1 1   Afraid - awful might happen 0 0 0 0  Total GAD 7 Score 4 4 3 3   Anxiety Difficulty Somewhat difficult Somewhat difficult Not difficult at all    Anhedonia: no Weight changes: no Insomnia: no   Hypersomnia: no Fatigue/loss of energy: yes Feelings of worthlessness: no Feelings of guilt: no Impaired concentration/indecisiveness: no Suicidal ideations: no  Crying spells: no Recent Stressors/Life Changes: yes   Relationship problems: no   Family stress: no     Financial stress: yes    Job stress: yes    Recent death/loss: no  DIABETES Hypoglycemic episodes:no Polydipsia/polyuria: no Visual disturbance: no Chest pain: no Paresthesias: no Glucose Monitoring: yes  Accucheck frequency: Not Checking Taking Insulin?: no Blood Pressure Monitoring: not checking Retinal Examination: Not up to Date Foot Exam: Not up to Date Diabetic Education: Completed  Pneumovax: Up to Date Influenza: Not up to Date Aspirin: no  HYPERTENSION / HYPERLIPIDEMIA Satisfied with current treatment? yes Duration of hypertension: chronic BP monitoring frequency: not checking BP medication side effects: no Past BP meds: benazepril Duration of hyperlipidemia: chronic Cholesterol medication side effects: no Cholesterol supplements: none Past cholesterol medications: atorvastatin Medication compliance: excellent compliance Aspirin: yes Recent stressors: no Recurrent headaches: no Visual changes: no Palpitations: no Dyspnea: no Chest pain: no Lower extremity edema: no Dizzy/lightheaded: no   Relevant past medical, surgical, family and social history reviewed and updated as indicated. Interim medical history since our last visit reviewed. Allergies and medications reviewed and updated.  Review of Systems  Constitutional: Negative.    Respiratory: Negative.    Cardiovascular: Negative.   Gastrointestinal: Negative.   Musculoskeletal: Negative.   Psychiatric/Behavioral:  Positive for dysphoric mood. Negative for agitation, behavioral problems, confusion, decreased concentration, hallucinations, self-injury, sleep disturbance and suicidal ideas. The patient is nervous/anxious. The patient is not hyperactive.     Per HPI unless specifically indicated above     Objective:    BP 118/65   Pulse 70   Ht 5\' 9"  (1.753 m)   Wt 163 lb 12.8 oz (74.3 kg)   SpO2 96%   BMI 24.19 kg/m   Wt Readings from Last 3 Encounters:  08/20/23 163 lb 12.8 oz (74.3 kg)  05/17/23 165 lb (74.8 kg)  02/08/23 168 lb 14.4 oz (76.6 kg)    Physical Exam Vitals and nursing note reviewed.  Constitutional:      General: He is not in acute distress.    Appearance: Normal appearance. He is not ill-appearing, toxic-appearing or diaphoretic.  HENT:     Head: Normocephalic and atraumatic.     Right Ear: External ear normal.     Left Ear: External ear normal.     Nose: Nose normal.     Mouth/Throat:     Mouth: Mucous membranes are moist.     Pharynx: Oropharynx is clear.  Eyes:     General: No scleral icterus.       Right eye: No discharge.        Left eye: No discharge.     Extraocular Movements: Extraocular movements intact.     Conjunctiva/sclera: Conjunctivae normal.     Pupils: Pupils are equal, round, and reactive to light.  Cardiovascular:     Rate and Rhythm: Normal rate and regular rhythm.     Pulses: Normal pulses.     Heart sounds: Normal heart sounds. No murmur heard.    No friction rub. No gallop.  Pulmonary:     Effort: Pulmonary effort is normal. No respiratory distress.     Breath sounds: Normal breath sounds. No stridor. No wheezing, rhonchi or rales.  Chest:     Chest wall: No tenderness.  Musculoskeletal:        General: Normal range of motion.     Cervical back: Normal range of motion and neck supple.  Skin:     General: Skin is warm and dry.     Capillary Refill: Capillary refill takes less than 2 seconds.     Coloration: Skin is not jaundiced or pale.     Findings: No bruising, erythema, lesion or rash.  Neurological:     General: No focal deficit present.     Mental Status: He is alert and oriented to person, place, and time. Mental status is at baseline.  Psychiatric:        Mood and Affect:  Mood normal.        Behavior: Behavior normal.        Thought Content: Thought content normal.        Judgment: Judgment normal.     Results for orders placed or performed in visit on 05/17/23  Bayer DCA Hb A1c Waived  Result Value Ref Range   HB A1C (BAYER DCA - WAIVED) 7.0 (H) 4.8 - 5.6 %      Assessment & Plan:   Problem List Items Addressed This Visit       Cardiovascular and Mediastinum   Hypertension (Chronic)    Under good control on current regimen. Continue current regimen. Continue to monitor. Call with any concerns. Refills given. Labs drawn today.       Relevant Medications   atorvastatin (LIPITOR) 40 MG tablet   benazepril (LOTENSIN) 40 MG tablet   Diabetes mellitus with peripheral vascular disease (HCC) - Primary    Stable with A1c of 7.1. Continue current regimen. Call with any concerns. Continue to monitor.       Relevant Medications   atorvastatin (LIPITOR) 40 MG tablet   benazepril (LOTENSIN) 40 MG tablet   glyBURIDE (DIABETA) 5 MG tablet   metFORMIN (GLUCOPHAGE) 500 MG tablet   Other Relevant Orders   Bayer DCA Hb A1c Waived   CBC with Differential/Platelet   Lipid Panel w/o Chol/HDL Ratio   Comprehensive metabolic panel   Microalbumin, Urine Waived     Other   Hyperlipidemia (Chronic)    Under good control on current regimen. Continue current regimen. Continue to monitor. Call with any concerns. Refills given. Labs drawn today.        Relevant Medications   atorvastatin (LIPITOR) 40 MG tablet   benazepril (LOTENSIN) 40 MG tablet   Anxiety (Chronic)     Will start lexapro. Call with any concerns. Continue to monitor. Recheck 1 month.       Relevant Medications   escitalopram (LEXAPRO) 5 MG tablet   Iron deficiency anemia    Labs drawn today. Await results. Treat as needed.       Relevant Medications   ferrous sulfate 325 (65 FE) MG tablet   Other Visit Diagnoses     Screening for prostate cancer       Labs drawn today. Await results.   Relevant Orders   PSA   Dysuria       + Leuks. Will check culture. Await results.   Relevant Orders   Urinalysis, Routine w reflex microscopic   Urine Culture        Follow up plan: Return in about 4 weeks (around 09/17/2023).

## 2023-08-20 NOTE — Assessment & Plan Note (Signed)
Stable with A1c of 7.1. Continue current regimen. Call with any concerns. Continue to monitor.

## 2023-08-21 LAB — COMPREHENSIVE METABOLIC PANEL
ALT: 19 [IU]/L (ref 0–44)
AST: 17 [IU]/L (ref 0–40)
Albumin: 4.4 g/dL (ref 3.9–4.9)
Alkaline Phosphatase: 62 [IU]/L (ref 44–121)
BUN/Creatinine Ratio: 25 — ABNORMAL HIGH (ref 10–24)
BUN: 18 mg/dL (ref 8–27)
Bilirubin Total: 0.2 mg/dL (ref 0.0–1.2)
CO2: 21 mmol/L (ref 20–29)
Calcium: 10.4 mg/dL — ABNORMAL HIGH (ref 8.6–10.2)
Chloride: 102 mmol/L (ref 96–106)
Creatinine, Ser: 0.72 mg/dL — ABNORMAL LOW (ref 0.76–1.27)
Globulin, Total: 2.4 g/dL (ref 1.5–4.5)
Glucose: 129 mg/dL — ABNORMAL HIGH (ref 70–99)
Potassium: 4.6 mmol/L (ref 3.5–5.2)
Sodium: 138 mmol/L (ref 134–144)
Total Protein: 6.8 g/dL (ref 6.0–8.5)
eGFR: 102 mL/min/{1.73_m2} (ref >59)

## 2023-08-21 LAB — CBC WITH DIFFERENTIAL/PLATELET
Basophils Absolute: 0.1 10*3/uL (ref 0.0–0.2)
Basos: 1 %
EOS (ABSOLUTE): 0.3 10*3/uL (ref 0.0–0.4)
Eos: 6 %
Hematocrit: 37.8 % (ref 37.5–51.0)
Hemoglobin: 12 g/dL — ABNORMAL LOW (ref 13.0–17.7)
Immature Grans (Abs): 0 10*3/uL (ref 0.0–0.1)
Immature Granulocytes: 1 %
Lymphocytes Absolute: 1.1 10*3/uL (ref 0.7–3.1)
Lymphs: 26 %
MCH: 30.7 pg (ref 26.6–33.0)
MCHC: 31.7 g/dL (ref 31.5–35.7)
MCV: 97 fL (ref 79–97)
Monocytes Absolute: 0.4 10*3/uL (ref 0.1–0.9)
Monocytes: 10 %
Neutrophils Absolute: 2.3 10*3/uL (ref 1.4–7.0)
Neutrophils: 56 %
Platelets: 246 10*3/uL (ref 150–450)
RBC: 3.91 x10E6/uL — ABNORMAL LOW (ref 4.14–5.80)
RDW: 12.8 % (ref 11.6–15.4)
WBC: 4.2 10*3/uL (ref 3.4–10.8)

## 2023-08-21 LAB — LIPID PANEL W/O CHOL/HDL RATIO
Cholesterol, Total: 128 mg/dL (ref 100–199)
HDL: 44 mg/dL (ref >39)
LDL Chol Calc (NIH): 67 mg/dL (ref 0–99)
Triglycerides: 85 mg/dL (ref 0–149)
VLDL Cholesterol Cal: 17 mg/dL (ref 5–40)

## 2023-08-21 LAB — PSA: Prostate Specific Ag, Serum: 0.3 ng/mL (ref 0.0–4.0)

## 2023-08-22 LAB — URINE CULTURE: Organism ID, Bacteria: NO GROWTH

## 2023-09-18 ENCOUNTER — Ambulatory Visit (INDEPENDENT_AMBULATORY_CARE_PROVIDER_SITE_OTHER): Payer: Self-pay | Admitting: Family Medicine

## 2023-09-18 ENCOUNTER — Encounter: Payer: Self-pay | Admitting: Family Medicine

## 2023-09-18 VITALS — BP 101/56 | HR 64 | Ht 69.0 in | Wt 163.8 lb

## 2023-09-18 DIAGNOSIS — N401 Enlarged prostate with lower urinary tract symptoms: Secondary | ICD-10-CM | POA: Insufficient documentation

## 2023-09-18 DIAGNOSIS — F419 Anxiety disorder, unspecified: Secondary | ICD-10-CM

## 2023-09-18 DIAGNOSIS — I10 Essential (primary) hypertension: Secondary | ICD-10-CM

## 2023-09-18 DIAGNOSIS — R351 Nocturia: Secondary | ICD-10-CM

## 2023-09-18 MED ORDER — ESCITALOPRAM OXALATE 5 MG PO TABS: 5.0000 mg | ORAL_TABLET | Freq: Every day | ORAL | 3 refills | Status: DC

## 2023-09-18 MED ORDER — TAMSULOSIN HCL 0.4 MG PO CAPS: 0.4000 mg | ORAL_CAPSULE | Freq: Every day | ORAL | 3 refills | Status: DC

## 2023-09-18 MED ORDER — BENAZEPRIL HCL 10 MG PO TABS: 10.0000 mg | ORAL_TABLET | Freq: Every day | ORAL | 3 refills | Status: DC

## 2023-09-18 NOTE — Assessment & Plan Note (Signed)
Running a little low- will cut his benazepril as we're adding flomax to prevent hypotension. Call with any concerns.

## 2023-09-18 NOTE — Assessment & Plan Note (Signed)
Improved. Doing well on the lexapro. Will increase his lexapro to 5mg . Recheck 1 month. Call with any concerns.

## 2023-09-18 NOTE — Assessment & Plan Note (Signed)
Will start him on flomax and recheck in 1 month. Call with any concerns.

## 2023-09-18 NOTE — Progress Notes (Signed)
BP (!) 101/56   Pulse 64   Ht 5\' 9"  (1.753 m)   Wt 163 lb 12.8 oz (74.3 kg)   SpO2 97%   BMI 24.19 kg/m    Subjective:    Patient ID: Stanley Dickerson, male    DOB: 1960/11/20, 62 y.o.   MRN: [id number]  HPI: Stanley Dickerson is a 62 y.o. male  Chief Complaint  Patient presents with   Anxiety   ANXIETY/STRESS-  Duration: chronic Status:better Anxious mood: yes  Excessive worrying: no Irritability: yes  Sweating: no Nausea: no Palpitations:no Hyperventilation: no Panic attacks: no Agoraphobia: no  Obscessions/compulsions: no Depressed mood: no    09/18/2023    8:50 AM 08/20/2023    8:53 AM 05/17/2023    9:35 AM 09/28/2022   10:35 AM 08/07/2022   11:08 AM  Depression screen PHQ 2/9  Decreased Interest 0 0 0 0 0  Down, Depressed, Hopeless 0 0 0 0 0  PHQ - 2 Score 0 0 0 0 0  Altered sleeping 1 1 1 1 1   Tired, decreased energy 1 1 1  0 1  Change in appetite 0 1 1 0 0  Feeling bad or failure about yourself  0 0 0 0 0  Trouble concentrating 1 1 0 0 1  Moving slowly or fidgety/restless 0 0 0 0 0  Suicidal thoughts 0 0 0 0 0  PHQ-9 Score 3 4 3 1 3   Difficult doing work/chores  Somewhat difficult Somewhat difficult Not difficult at all Not difficult at all      09/18/2023    8:50 AM 08/20/2023    8:53 AM 05/17/2023    9:35 AM 09/28/2022   10:35 AM  GAD 7 : Generalized Anxiety Score  Nervous, Anxious, on Edge 0 1 1 1   Control/stop worrying  0 0 0  Worry too much - different things  0 0 0  Trouble relaxing 1 1 1 1   Restless  1 1 0  Easily annoyed or irritable 0 1 1 1   Afraid - awful might happen 0 0 0 0  Total GAD 7 Score  4 4 3   Anxiety Difficulty Somewhat difficult Somewhat difficult Somewhat difficult Not difficult at all   Anhedonia: no Weight changes: no Insomnia: no   Hypersomnia: no Fatigue/loss of energy: no Feelings of worthlessness: no Feelings of guilt: no Impaired concentration/indecisiveness: no Suicidal ideations: no  Crying spells:  no Recent Stressors/Life Changes: yes   Relationship problems: no   Family stress: no     Financial stress: no    Job stress: yes    Recent death/loss: no  Has been having groin pain for about a month and a half. Mainly happening at night. More of a steady, low grade pain. He notes that it's better with urination and worse when he feels like he has to go. No other concerns or complaints at this time.   Relevant past medical, surgical, family and social history reviewed and updated as indicated. Interim medical history since our last visit reviewed. Allergies and medications reviewed and updated.  Review of Systems  Constitutional: Negative.   Respiratory: Negative.    Cardiovascular: Negative.   Musculoskeletal: Negative.   Neurological: Negative.   Psychiatric/Behavioral:  Negative for agitation, behavioral problems, confusion, decreased concentration, dysphoric mood, hallucinations, self-injury, sleep disturbance and suicidal ideas. The patient is nervous/anxious. The patient is not hyperactive.     Per HPI unless specifically indicated above     Objective:    BP Marland Kitchen)  101/56   Pulse 64   Ht 5\' 9"  (1.753 m)   Wt 163 lb 12.8 oz (74.3 kg)   SpO2 97%   BMI 24.19 kg/m   Wt Readings from Last 3 Encounters:  09/18/23 163 lb 12.8 oz (74.3 kg)  08/20/23 163 lb 12.8 oz (74.3 kg)  05/17/23 165 lb (74.8 kg)    Physical Exam Vitals and nursing note reviewed.  Constitutional:      General: He is not in acute distress.    Appearance: Normal appearance. He is normal weight. He is not ill-appearing, toxic-appearing or diaphoretic.  HENT:     Head: Normocephalic and atraumatic.     Right Ear: External ear normal.     Left Ear: External ear normal.     Nose: Nose normal.     Mouth/Throat:     Mouth: Mucous membranes are moist.     Pharynx: Oropharynx is clear.  Eyes:     General: No scleral icterus.       Right eye: No discharge.        Left eye: No discharge.     Extraocular  Movements: Extraocular movements intact.     Conjunctiva/sclera: Conjunctivae normal.     Pupils: Pupils are equal, round, and reactive to light.  Cardiovascular:     Rate and Rhythm: Normal rate and regular rhythm.     Pulses: Normal pulses.     Heart sounds: Normal heart sounds. No murmur heard.    No friction rub. No gallop.  Pulmonary:     Effort: Pulmonary effort is normal. No respiratory distress.     Breath sounds: Normal breath sounds. No stridor. No wheezing, rhonchi or rales.  Chest:     Chest wall: No tenderness.  Musculoskeletal:        General: Normal range of motion.     Cervical back: Normal range of motion and neck supple.  Skin:    General: Skin is warm and dry.     Capillary Refill: Capillary refill takes less than 2 seconds.     Coloration: Skin is not jaundiced or pale.     Findings: No bruising, erythema, lesion or rash.  Neurological:     General: No focal deficit present.     Mental Status: He is alert and oriented to person, place, and time. Mental status is at baseline.  Psychiatric:        Mood and Affect: Mood normal.        Behavior: Behavior normal.        Thought Content: Thought content normal.        Judgment: Judgment normal.     Results for orders placed or performed in visit on 08/20/23  Urine Culture   Specimen: Urine   UR  Result Value Ref Range   Urine Culture, Routine Final report    Organism ID, Bacteria No growth   Microscopic Examination   Urine  Result Value Ref Range   WBC, UA 0-5 0 - 5 /hpf   RBC, Urine None seen 0 - 2 /hpf   Epithelial Cells (non renal) None seen 0 - 10 /hpf   Bacteria, UA None seen None seen/Few  Bayer DCA Hb A1c Waived  Result Value Ref Range   HB A1C (BAYER DCA - WAIVED) 7.1 (H) 4.8 - 5.6 %  CBC with Differential/Platelet  Result Value Ref Range   WBC 4.2 3.4 - 10.8 x10E3/uL   RBC 3.91 (L) 4.14 - 5.80 x10E6/uL   Hemoglobin  12.0 (L) 13.0 - 17.7 g/dL   Hematocrit 40.9 81.1 - 51.0 %   MCV 97 79 - 97  fL   MCH 30.7 26.6 - 33.0 pg   MCHC 31.7 31.5 - 35.7 g/dL   RDW 91.4 78.2 - 95.6 %   Platelets 246 150 - 450 x10E3/uL   Neutrophils 56 Not Estab. %   Lymphs 26 Not Estab. %   Monocytes 10 Not Estab. %   Eos 6 Not Estab. %   Basos 1 Not Estab. %   Neutrophils Absolute 2.3 1.4 - 7.0 x10E3/uL   Lymphocytes Absolute 1.1 0.7 - 3.1 x10E3/uL   Monocytes Absolute 0.4 0.1 - 0.9 x10E3/uL   EOS (ABSOLUTE) 0.3 0.0 - 0.4 x10E3/uL   Basophils Absolute 0.1 0.0 - 0.2 x10E3/uL   Immature Granulocytes 1 Not Estab. %   Immature Grans (Abs) 0.0 0.0 - 0.1 x10E3/uL  Lipid Panel w/o Chol/HDL Ratio  Result Value Ref Range   Cholesterol, Total 128 100 - 199 mg/dL   Triglycerides 85 0 - 149 mg/dL   HDL 44 >21 mg/dL   VLDL Cholesterol Cal 17 5 - 40 mg/dL   LDL Chol Calc (NIH) 67 0 - 99 mg/dL  Comprehensive metabolic panel  Result Value Ref Range   Glucose 129 (H) 70 - 99 mg/dL   BUN 18 8 - 27 mg/dL   Creatinine, Ser 3.08 (L) 0.76 - 1.27 mg/dL   eGFR 657 >84 ON/GEX/5.28   BUN/Creatinine Ratio 25 (H) 10 - 24   Sodium 138 134 - 144 mmol/L   Potassium 4.6 3.5 - 5.2 mmol/L   Chloride 102 96 - 106 mmol/L   CO2 21 20 - 29 mmol/L   Calcium 10.4 (H) 8.6 - 10.2 mg/dL   Total Protein 6.8 6.0 - 8.5 g/dL   Albumin 4.4 3.9 - 4.9 g/dL   Globulin, Total 2.4 1.5 - 4.5 g/dL   Bilirubin Total 0.2 0.0 - 1.2 mg/dL   Alkaline Phosphatase 62 44 - 121 IU/L   AST 17 0 - 40 IU/L   ALT 19 0 - 44 IU/L  Microalbumin, Urine Waived  Result Value Ref Range   Microalb, Ur Waived 10 0 - 19 mg/L   Creatinine, Urine Waived 50 10 - 300 mg/dL   Microalb/Creat Ratio 30-300 (H) <30 mg/g  PSA  Result Value Ref Range   Prostate Specific Ag, Serum 0.3 0.0 - 4.0 ng/mL  Urinalysis, Routine w reflex microscopic  Result Value Ref Range   Specific Gravity, UA 1.015 1.005 - 1.030   pH, UA 6.0 5.0 - 7.5   Color, UA Yellow Yellow   Appearance Ur Clear Clear   Leukocytes,UA Trace (A) Negative   Protein,UA Negative Negative/Trace    Glucose, UA Negative Negative   Ketones, UA Negative Negative   RBC, UA Negative Negative   Bilirubin, UA Negative Negative   Urobilinogen, Ur 0.2 0.2 - 1.0 mg/dL   Nitrite, UA Negative Negative   Microscopic Examination See below:       Assessment & Plan:   Problem List Items Addressed This Visit       Cardiovascular and Mediastinum   Hypertension (Chronic)    Running a little low- will cut his benazepril as we're adding flomax to prevent hypotension. Call with any concerns.       Relevant Medications   benazepril (LOTENSIN) 10 MG tablet     Other   Anxiety (Chronic)    Improved. Doing well on the lexapro. Will increase his  lexapro to 5mg . Recheck 1 month. Call with any concerns.       Relevant Medications   escitalopram (LEXAPRO) 5 MG tablet   Benign prostatic hyperplasia with nocturia - Primary    Will start him on flomax and recheck in 1 month. Call with any concerns.         Follow up plan: Return in about 6 weeks (around 10/30/2023).

## 2023-10-04 ENCOUNTER — Telehealth: Payer: Self-pay

## 2023-10-04 NOTE — Telephone Encounter (Signed)
-----   Message from Olevia Perches sent at 09/18/2023  8:58 AM EST ----- Eye exam with Dr. Clydene Pugh

## 2023-10-04 NOTE — Telephone Encounter (Signed)
Patient most recent Diabetic Eye Exam was requested at today's visit. 

## 2023-10-07 ENCOUNTER — Ambulatory Visit: Payer: Self-pay

## 2023-10-07 NOTE — Telephone Encounter (Signed)
  Chief Complaint: COVID positive Symptoms: runny nose, sore throat, cough, HA, body aches, SOB last night  Frequency: last night, tested positive today  Pertinent Negatives: Patient denies fever Disposition: [] ED /[] Urgent Care (no appt availability in office) / [] Appointment(In office/virtual)/ []  [facility]/ [] Home Care/ [] Refused Recommended Disposition /[] [place] [facility]/ [x]  Follow-up with PCP Additional Notes: pt is wanting to see if Dr. Laural Benes can prescribe something for his sx r/t COVID. Pt doesn't have insurance and is self pay. Recommended he do a VV but nothing available until 10/14/23. Pt is wanting to see what can be done today since office is closed next 2 days. Advised pt I would send message back and have nurse FU with him when possible. Pt states if Dr. Laural Benes can send something in just as long as not too expensive since self pay. Care advice given and pt verbalized understanding.   Summary: covid pos   Pt having sore throat, runny nose, cough, severe headache, body aches.  Took covid test this am, and positive.  Would like to know if Dr Laural Benes will call him something. [facility]         Reason for Disposition  [1] HIGH RISK patient (e.g., weak immune system, age > 64 years, obesity with BMI 30 or higher, pregnant, chronic lung disease or other chronic medical condition) AND [2] COVID symptoms (e.g., cough, fever)  (Exceptions: Already seen by PCP and no new or worsening symptoms.)  Answer Assessment - Initial Assessment Questions 1. COVID-19 DIAGNOSIS: "How do you know that you have COVID?" (e.g., positive lab test or self-test, diagnosed by doctor or NP/PA, symptoms after exposure).     Home test positive today  3. ONSET: "When did the COVID-19 symptoms start?"      Last night  5. COUGH: "Do you have a cough?" If Yes, ask: "How bad is the cough?"       Yes  6. FEVER: "Do you have a fever?" If Yes, ask: "What is your temperature, how was it measured,  and when did it start?"     no 7. RESPIRATORY STATUS: "Describe your breathing?" (e.g., normal; shortness of breath, wheezing, unable to speak)      SOB last night but not today  9. OTHER SYMPTOMS: "Do you have any other symptoms?"  (e.g., chills, fatigue, headache, loss of smell or taste, muscle pain, sore throat)     Runny nose, sore throat, HA, body aches  10. HIGH RISK DISEASE: "Do you have any chronic medical problems?" (e.g., asthma, heart or lung disease, weak immune system, obesity, etc.)       DM  Protocols used: Coronavirus (COVID-19) Diagnosed or Suspected-A-AH

## 2023-10-07 NOTE — Telephone Encounter (Signed)
Called patient to advise the [facility] virtual patient declined the option.

## 2023-10-30 ENCOUNTER — Encounter: Payer: Self-pay | Admitting: Family Medicine

## 2023-10-30 ENCOUNTER — Ambulatory Visit (INDEPENDENT_AMBULATORY_CARE_PROVIDER_SITE_OTHER): Payer: Self-pay | Admitting: Family Medicine

## 2023-10-30 VITALS — BP 132/65 | HR 60 | Temp 98.1°F | Wt 168.4 lb

## 2023-10-30 DIAGNOSIS — I10 Essential (primary) hypertension: Secondary | ICD-10-CM

## 2023-10-30 DIAGNOSIS — N401 Enlarged prostate with lower urinary tract symptoms: Secondary | ICD-10-CM

## 2023-10-30 DIAGNOSIS — F419 Anxiety disorder, unspecified: Secondary | ICD-10-CM

## 2023-10-30 DIAGNOSIS — Z1211 Encounter for screening for malignant neoplasm of colon: Secondary | ICD-10-CM

## 2023-10-30 DIAGNOSIS — R351 Nocturia: Secondary | ICD-10-CM

## 2023-10-30 DIAGNOSIS — R5382 Chronic fatigue, unspecified: Secondary | ICD-10-CM

## 2023-10-30 MED ORDER — BENAZEPRIL HCL 20 MG PO TABS: 30.0000 mg | ORAL_TABLET | Freq: Every day | ORAL | 1 refills | Status: DC

## 2023-10-30 MED ORDER — TAMSULOSIN HCL 0.4 MG PO CAPS: 0.4000 mg | ORAL_CAPSULE | Freq: Every day | ORAL | 1 refills | Status: DC

## 2023-10-30 MED ORDER — ESCITALOPRAM OXALATE 5 MG PO TABS: 5.0000 mg | ORAL_TABLET | Freq: Every day | ORAL | 1 refills | Status: DC

## 2023-10-30 NOTE — Assessment & Plan Note (Signed)
 Under good control on current regimen. Continue current regimen. Continue to monitor. Call with any concerns. Refills given.

## 2023-10-30 NOTE — Assessment & Plan Note (Signed)
 Under good control on current regimen. Continue current regimen. Continue to monitor. Call with any concerns. Refills given. Labs drawn today.

## 2023-10-30 NOTE — Progress Notes (Signed)
 BP 132/65   Pulse 60   Temp 98.1 F (36.7 C) (Oral)   Wt 168 lb 6.4 oz (76.4 kg)   SpO2 97%   BMI 24.87 kg/m    Subjective:    Patient ID: Stanley Dickerson, male    DOB: April 26, 1961, 63 y.o.   MRN: [id number]  HPI: Stanley Dickerson is a 63 y.o. male  Chief Complaint  Patient presents with   Hypertension   Anxiety   Covid Positive    Patient says on 10/07/23 tested positive for COVID and says he feels he is taking a longer time to recover. Patient says is still having some sore throat and feels run down. Patient says his son and gf were positive and recovered better than him. Patient says he has been having to take naps after a day's work.    Benign Prostatic Hypertrophy   Had covid about 3 weeks ago. Still feeling really tired.    HYPERTENSION  Hypertension status: better  Satisfied with current treatment? yes Duration of hypertension: chronic BP monitoring frequency:  not checking BP medication side effects:  no Medication compliance: good compliance Previous BP meds:benazepril  Aspirin: yes Recurrent headaches: no Visual changes: no Palpitations: no Dyspnea: no Chest pain: no Lower extremity edema: no Dizzy/lightheaded: no  BPH BPH status: better Satisfied with current treatment?: unsure Medication side effects: no Medication compliance: excellent compliance Duration: chronic Nocturia: 1-2x per night Urinary frequency:no Incomplete voiding: no Urgency: no Weak urinary stream: yes Straining to start stream: no Dysuria: no Onset: gradual Severity: mild  ANXIETY/STRESS Duration: chronic Status:better Anxious mood: no  Excessive worrying: no Irritability: no  Sweating: no Nausea: no Palpitations:no Hyperventilation: no Panic attacks: no Agoraphobia: no  Obscessions/compulsions: no Depressed mood: no    10/30/2023    9:13 AM 09/18/2023    8:50 AM 08/20/2023    8:53 AM 05/17/2023    9:35 AM 09/28/2022   10:35 AM  Depression screen PHQ 2/9   Decreased Interest 0 0 0 0 0  Down, Depressed, Hopeless 0 0 0 0 0  PHQ - 2 Score 0 0 0 0 0  Altered sleeping 1 1 1 1 1   Tired, decreased energy 0 1 1 1  0  Change in appetite 0 0 1 1 0  Feeling bad or failure about yourself  0 0 0 0 0  Trouble concentrating 0 1 1 0 0  Moving slowly or fidgety/restless 0 0 0 0 0  Suicidal thoughts 0 0 0 0 0  PHQ-9 Score 1 3 4 3 1   Difficult doing work/chores Not difficult at all  Somewhat difficult Somewhat difficult Not difficult at all   Anhedonia: no Weight changes: no Insomnia: no   Hypersomnia: no Fatigue/loss of energy: no Feelings of worthlessness: no Feelings of guilt: no Impaired concentration/indecisiveness: no Suicidal ideations: no  Crying spells: no Recent Stressors/Life Changes: no   Relationship problems: no   Family stress: no     Financial stress: no    Job stress: no    Recent death/loss: no   Relevant past medical, surgical, family and social history reviewed and updated as indicated. Interim medical history since our last visit reviewed. Allergies and medications reviewed and updated.  Review of Systems  Constitutional: Negative.   Respiratory: Negative.    Cardiovascular: Negative.   Musculoskeletal: Negative.   Skin: Negative.   Psychiatric/Behavioral: Negative.      Per HPI unless specifically indicated above     Objective:    BP 132/65  Pulse 60   Temp 98.1 F (36.7 C) (Oral)   Wt 168 lb 6.4 oz (76.4 kg)   SpO2 97%   BMI 24.87 kg/m   Wt Readings from Last 3 Encounters:  10/30/23 168 lb 6.4 oz (76.4 kg)  09/18/23 163 lb 12.8 oz (74.3 kg)  08/20/23 163 lb 12.8 oz (74.3 kg)    Physical Exam Vitals and nursing note reviewed.  Constitutional:      General: He is not in acute distress.    Appearance: Normal appearance. He is not ill-appearing, toxic-appearing or diaphoretic.  HENT:     Head: Normocephalic and atraumatic.     Right Ear: External ear normal.     Left Ear: External ear normal.      Nose: Nose normal.     Mouth/Throat:     Mouth: Mucous membranes are moist.     Pharynx: Oropharynx is clear.  Eyes:     General: No scleral icterus.       Right eye: No discharge.        Left eye: No discharge.     Extraocular Movements: Extraocular movements intact.     Conjunctiva/sclera: Conjunctivae normal.     Pupils: Pupils are equal, round, and reactive to light.  Cardiovascular:     Rate and Rhythm: Normal rate and regular rhythm.     Pulses: Normal pulses.     Heart sounds: Normal heart sounds. No murmur heard.    No friction rub. No gallop.  Pulmonary:     Effort: Pulmonary effort is normal. No respiratory distress.     Breath sounds: Normal breath sounds. No stridor. No wheezing, rhonchi or rales.  Chest:     Chest wall: No tenderness.  Musculoskeletal:        General: Normal range of motion.     Cervical back: Normal range of motion and neck supple.  Skin:    General: Skin is warm and dry.     Capillary Refill: Capillary refill takes less than 2 seconds.     Coloration: Skin is not jaundiced or pale.     Findings: No bruising, erythema, lesion or rash.  Neurological:     General: No focal deficit present.     Mental Status: He is alert and oriented to person, place, and time. Mental status is at baseline.  Psychiatric:        Mood and Affect: Mood normal.        Behavior: Behavior normal.        Thought Content: Thought content normal.        Judgment: Judgment normal.     Results for orders placed or performed in visit on 08/20/23  Bayer DCA Hb A1c Waived   Collection Time: 08/20/23  8:44 AM  Result Value Ref Range   HB A1C (BAYER DCA - WAIVED) 7.1 (H) 4.8 - 5.6 %  Microalbumin, Urine Waived   Collection Time: 08/20/23  8:44 AM  Result Value Ref Range   Microalb, Ur Waived 10 0 - 19 mg/L   Creatinine, Urine Waived 50 10 - 300 mg/dL   Microalb/Creat Ratio 30-300 (H) <30 mg/g  CBC with Differential/Platelet   Collection Time: 08/20/23  8:45 AM  Result  Value Ref Range   WBC 4.2 3.4 - 10.8 x10E3/uL   RBC 3.91 (L) 4.14 - 5.80 x10E6/uL   Hemoglobin 12.0 (L) 13.0 - 17.7 g/dL   Hematocrit 62.1 30.8 - 51.0 %   MCV 97 79 - 97 fL  MCH 30.7 26.6 - 33.0 pg   MCHC 31.7 31.5 - 35.7 g/dL   RDW 16.1 09.6 - 04.5 %   Platelets 246 150 - 450 x10E3/uL   Neutrophils 56 Not Estab. %   Lymphs 26 Not Estab. %   Monocytes 10 Not Estab. %   Eos 6 Not Estab. %   Basos 1 Not Estab. %   Neutrophils Absolute 2.3 1.4 - 7.0 x10E3/uL   Lymphocytes Absolute 1.1 0.7 - 3.1 x10E3/uL   Monocytes Absolute 0.4 0.1 - 0.9 x10E3/uL   EOS (ABSOLUTE) 0.3 0.0 - 0.4 x10E3/uL   Basophils Absolute 0.1 0.0 - 0.2 x10E3/uL   Immature Granulocytes 1 Not Estab. %   Immature Grans (Abs) 0.0 0.0 - 0.1 x10E3/uL  Lipid Panel w/o Chol/HDL Ratio   Collection Time: 08/20/23  8:45 AM  Result Value Ref Range   Cholesterol, Total 128 100 - 199 mg/dL   Triglycerides 85 0 - 149 mg/dL   HDL 44 >40 mg/dL   VLDL Cholesterol Cal 17 5 - 40 mg/dL   LDL Chol Calc (NIH) 67 0 - 99 mg/dL  Comprehensive metabolic panel   Collection Time: 08/20/23  8:45 AM  Result Value Ref Range   Glucose 129 (H) 70 - 99 mg/dL   BUN 18 8 - 27 mg/dL   Creatinine, Ser 9.81 (L) 0.76 - 1.27 mg/dL   eGFR 191 >47 WG/NFA/2.13   BUN/Creatinine Ratio 25 (H) 10 - 24   Sodium 138 134 - 144 mmol/L   Potassium 4.6 3.5 - 5.2 mmol/L   Chloride 102 96 - 106 mmol/L   CO2 21 20 - 29 mmol/L   Calcium  10.4 (H) 8.6 - 10.2 mg/dL   Total Protein 6.8 6.0 - 8.5 g/dL   Albumin 4.4 3.9 - 4.9 g/dL   Globulin, Total 2.4 1.5 - 4.5 g/dL   Bilirubin Total 0.2 0.0 - 1.2 mg/dL   Alkaline Phosphatase 62 44 - 121 IU/L   AST 17 0 - 40 IU/L   ALT 19 0 - 44 IU/L  PSA   Collection Time: 08/20/23  8:45 AM  Result Value Ref Range   Prostate Specific Ag, Serum 0.3 0.0 - 4.0 ng/mL  Microscopic Examination   Collection Time: 08/20/23  9:00 AM   Urine  Result Value Ref Range   WBC, UA 0-5 0 - 5 /hpf   RBC, Urine None seen 0 - 2 /hpf    Epithelial Cells (non renal) None seen 0 - 10 /hpf   Bacteria, UA None seen None seen/Few  Urinalysis, Routine w reflex microscopic   Collection Time: 08/20/23  9:00 AM  Result Value Ref Range   Specific Gravity, UA 1.015 1.005 - 1.030   pH, UA 6.0 5.0 - 7.5   Color, UA Yellow Yellow   Appearance Ur Clear Clear   Leukocytes,UA Trace (A) Negative   Protein,UA Negative Negative/Trace   Glucose, UA Negative Negative   Ketones, UA Negative Negative   RBC, UA Negative Negative   Bilirubin, UA Negative Negative   Urobilinogen, Ur 0.2 0.2 - 1.0 mg/dL   Nitrite, UA Negative Negative   Microscopic Examination See below:   Urine Culture   Collection Time: 08/20/23  4:09 PM   Specimen: Urine   UR  Result Value Ref Range   Urine Culture, Routine Final report    Organism ID, Bacteria No growth       Assessment & Plan:   Problem List Items Addressed This Visit  Cardiovascular and Mediastinum   Hypertension - Primary (Chronic)   Under good control on current regimen. Continue current regimen. Continue to monitor. Call with any concerns. Refills given. Labs drawn today.        Relevant Medications   benazepril  (LOTENSIN ) 20 MG tablet   Other Relevant Orders   Basic metabolic panel     Other   Anxiety (Chronic)   Under good control on current regimen. Continue current regimen. Continue to monitor. Call with any concerns. Refills given.       Relevant Medications   escitalopram  (LEXAPRO ) 5 MG tablet   Benign prostatic hyperplasia with nocturia   Under good control on current regimen. Continue current regimen. Continue to monitor. Call with any concerns. Refills given.       Other Visit Diagnoses       Screening for colon cancer       Overdue for colonoscopy. Referral to GI placed today.   Relevant Orders   Ambulatory referral to Gastroenterology     Chronic fatigue       Likely post-covid. Encouraged rest and discussed how it usually resolved 4-6 weeks after illness.  Will check labs. Await results.   Relevant Orders   CBC with Differential/Platelet        Follow up plan: Return in about 6 weeks (around 12/11/2023).

## 2023-10-31 LAB — CBC WITH DIFFERENTIAL/PLATELET
Basophils Absolute: 0.1 10*3/uL (ref 0.0–0.2)
Basos: 1 %
EOS (ABSOLUTE): 0.2 10*3/uL (ref 0.0–0.4)
Eos: 4 %
Hematocrit: 33.6 % — ABNORMAL LOW (ref 37.5–51.0)
Hemoglobin: 11 g/dL — ABNORMAL LOW (ref 13.0–17.7)
Immature Grans (Abs): 0 10*3/uL (ref 0.0–0.1)
Immature Granulocytes: 1 %
Lymphocytes Absolute: 1.1 10*3/uL (ref 0.7–3.1)
Lymphs: 26 %
MCH: 31.3 pg (ref 26.6–33.0)
MCHC: 32.7 g/dL (ref 31.5–35.7)
MCV: 96 fL (ref 79–97)
Monocytes Absolute: 0.5 10*3/uL (ref 0.1–0.9)
Monocytes: 11 %
Neutrophils Absolute: 2.4 10*3/uL (ref 1.4–7.0)
Neutrophils: 57 %
Platelets: 242 10*3/uL (ref 150–450)
RBC: 3.52 x10E6/uL — ABNORMAL LOW (ref 4.14–5.80)
RDW: 13.2 % (ref 11.6–15.4)
WBC: 4.2 10*3/uL (ref 3.4–10.8)

## 2023-10-31 LAB — BASIC METABOLIC PANEL
BUN/Creatinine Ratio: 23 (ref 10–24)
BUN: 17 mg/dL (ref 8–27)
CO2: 24 mmol/L (ref 20–29)
Calcium: 9.8 mg/dL (ref 8.6–10.2)
Chloride: 100 mmol/L (ref 96–106)
Creatinine, Ser: 0.74 mg/dL — ABNORMAL LOW (ref 0.76–1.27)
Glucose: 166 mg/dL — ABNORMAL HIGH (ref 70–99)
Potassium: 4.8 mmol/L (ref 3.5–5.2)
Sodium: 136 mmol/L (ref 134–144)
eGFR: 101 mL/min/{1.73_m2} (ref >59)

## 2023-11-03 ENCOUNTER — Encounter: Payer: Self-pay | Admitting: Family Medicine

## 2023-11-06 LAB — HM DIABETES EYE EXAM

## 2024-01-24 ENCOUNTER — Other Ambulatory Visit: Payer: Self-pay

## 2024-01-24 DIAGNOSIS — I10 Essential (primary) hypertension: Secondary | ICD-10-CM

## 2024-01-24 NOTE — Addendum Note (Signed)
 Addended by: Alfredo Bach T on: 01/24/2024 03:36 PM   Modules accepted: Orders

## 2024-01-24 NOTE — Telephone Encounter (Signed)
 Patient would like a refill on his Benazepril. Order is pending for Dr. Shela Commons to sign.

## 2024-01-24 NOTE — Progress Notes (Signed)
 Patient would like a refill on his Benazepril. Order is pending for Dr. Shela Commons to sign.

## 2024-01-27 NOTE — Progress Notes (Signed)
 Patient is overdue for appt. Please get him scheduled.

## 2024-01-27 NOTE — Telephone Encounter (Signed)
 Should not be due until July and overdue for follow up

## 2024-01-29 NOTE — Progress Notes (Signed)
 Called patient. Left message for patient to call back and schedule appt for med refill.

## 2024-02-06 ENCOUNTER — Encounter: Payer: Self-pay | Admitting: Family Medicine

## 2024-02-06 ENCOUNTER — Ambulatory Visit: Payer: Self-pay | Admitting: Family Medicine

## 2024-02-06 ENCOUNTER — Encounter: Payer: Self-pay | Admitting: Oncology

## 2024-02-06 VITALS — BP 127/67 | HR 61 | Wt 167.0 lb

## 2024-02-06 DIAGNOSIS — M25562 Pain in left knee: Secondary | ICD-10-CM

## 2024-02-06 DIAGNOSIS — N401 Enlarged prostate with lower urinary tract symptoms: Secondary | ICD-10-CM

## 2024-02-06 DIAGNOSIS — Z23 Encounter for immunization: Secondary | ICD-10-CM | POA: Diagnosis not present

## 2024-02-06 DIAGNOSIS — E7800 Pure hypercholesterolemia, unspecified: Secondary | ICD-10-CM | POA: Diagnosis not present

## 2024-02-06 DIAGNOSIS — I10 Essential (primary) hypertension: Secondary | ICD-10-CM | POA: Diagnosis not present

## 2024-02-06 DIAGNOSIS — D500 Iron deficiency anemia secondary to blood loss (chronic): Secondary | ICD-10-CM

## 2024-02-06 DIAGNOSIS — E1151 Type 2 diabetes mellitus with diabetic peripheral angiopathy without gangrene: Secondary | ICD-10-CM | POA: Diagnosis not present

## 2024-02-06 DIAGNOSIS — M25561 Pain in right knee: Secondary | ICD-10-CM

## 2024-02-06 DIAGNOSIS — R351 Nocturia: Secondary | ICD-10-CM

## 2024-02-06 DIAGNOSIS — Z1211 Encounter for screening for malignant neoplasm of colon: Secondary | ICD-10-CM

## 2024-02-06 DIAGNOSIS — G8929 Other chronic pain: Secondary | ICD-10-CM

## 2024-02-06 LAB — BAYER DCA HB A1C WAIVED: HB A1C (BAYER DCA - WAIVED): 7.1 % — ABNORMAL HIGH (ref 4.8–5.6)

## 2024-02-06 LAB — MICROALBUMIN, URINE WAIVED
Creatinine, Urine Waived: 200 mg/dL (ref 10–300)
Microalb, Ur Waived: 30 mg/L — ABNORMAL HIGH (ref 0–19)
Microalb/Creat Ratio: <30 mg/g (ref <30)

## 2024-02-06 MED ORDER — ATORVASTATIN CALCIUM 40 MG PO TABS: 40.0000 mg | ORAL_TABLET | ORAL | 1 refills | Status: DC

## 2024-02-06 MED ORDER — BENAZEPRIL HCL 20 MG PO TABS
30.0000 mg | ORAL_TABLET | Freq: Every day | ORAL | 1 refills | Status: DC
Start: 2024-02-06 — End: 2024-08-10

## 2024-02-06 MED ORDER — METFORMIN HCL 500 MG PO TABS: 1000.0000 mg | ORAL_TABLET | Freq: Two times a day (BID) | ORAL | 1 refills | Status: DC

## 2024-02-06 MED ORDER — GLYBURIDE 5 MG PO TABS: 5.0000 mg | ORAL_TABLET | Freq: Every day | ORAL | 1 refills | Status: DC

## 2024-02-06 MED ORDER — CELECOXIB 100 MG PO CAPS: 100.0000 mg | ORAL_CAPSULE | Freq: Two times a day (BID) | ORAL | 1 refills | Status: DC

## 2024-02-06 MED ORDER — ESCITALOPRAM OXALATE 5 MG PO TABS: 5.0000 mg | ORAL_TABLET | Freq: Every day | ORAL | 1 refills | Status: DC

## 2024-02-06 MED ORDER — FERROUS SULFATE 325 (65 FE) MG PO TABS: 325.0000 mg | ORAL_TABLET | Freq: Two times a day (BID) | ORAL | 4 refills | Status: AC

## 2024-02-06 NOTE — Assessment & Plan Note (Signed)
 Under good control on current regimen. Continue current regimen. Continue to monitor. Call with any concerns. Refills given. Labs drawn today.

## 2024-02-06 NOTE — Assessment & Plan Note (Signed)
 Rechecking labs today. Due for colonoscopy. Ordered today

## 2024-02-06 NOTE — Assessment & Plan Note (Signed)
 Stable with A1c of 7.1. Continue current regimen. Continue to monitor. Call with any concerns.

## 2024-02-06 NOTE — Assessment & Plan Note (Signed)
 Would like to hold his flomax  right now. Call with any concerns. Continue to monitor.

## 2024-02-06 NOTE — Progress Notes (Signed)
 BP 127/67 (BP Location: Left Arm)   Pulse 61   Wt 167 lb (75.8 kg)   BMI 24.66 kg/m    Subjective:    Patient ID: Stanley Dickerson, male    DOB: 1961-09-07, 63 y.o.   MRN: [id number]  HPI: Stanley Dickerson is a 63 y.o. male  Chief Complaint  Patient presents with   Hypertension   Diabetes   DIABETES Hypoglycemic episodes:no Polydipsia/polyuria: no Visual disturbance: no Chest pain: no Paresthesias: no Glucose Monitoring: yes  Accucheck frequency: Not Checking Taking Insulin?: no Blood Pressure Monitoring: rarely Retinal Examination: Up to Date Foot Exam: Up to Date Diabetic Education: Completed Pneumovax: Up to Date Influenza: Up to Date Aspirin: yes  HYPERTENSION / HYPERLIPIDEMIA Satisfied with current treatment? yes Duration of hypertension: chronic BP monitoring frequency: not checking BP medication side effects: no Past BP meds: benazepril  Duration of hyperlipidemia: chronic Cholesterol medication side effects: no Cholesterol supplements: none Past cholesterol medications: atorvastatin  Medication compliance: excellent compliance Aspirin: yes Recent stressors: no Recurrent headaches: no Visual changes: no Palpitations: no Dyspnea: no Chest pain: no Lower extremity edema: no Dizzy/lightheaded: no  DEPRESSION Mood status: controlled Satisfied with current treatment?: yes Symptom severity: mild  Duration of current treatment : chronic Side effects: no Medication compliance: excellent compliance Psychotherapy/counseling: no  Previous psychiatric medications: lexapro  Depressed mood: no Anxious mood: no Anhedonia: no Significant weight loss or gain: no Insomnia: no  Fatigue: yes Feelings of worthlessness or guilt: no Impaired concentration/indecisiveness: no Suicidal ideations: no Hopelessness: no Crying spells: no    02/06/2024    1:48 PM 10/30/2023    9:13 AM 09/18/2023    8:50 AM 08/20/2023    8:53 AM 05/17/2023    9:35 AM  Depression  screen PHQ 2/9  Decreased Interest 0 0 0 0 0  Down, Depressed, Hopeless 0 0 0 0 0  PHQ - 2 Score 0 0 0 0 0  Altered sleeping 1 1 1 1 1   Tired, decreased energy 1 0 1 1 1   Change in appetite 0 0 0 1 1  Feeling bad or failure about yourself  0 0 0 0 0  Trouble concentrating 0 0 1 1 0  Moving slowly or fidgety/restless 0 0 0 0 0  Suicidal thoughts 0 0 0 0 0  PHQ-9 Score 2 1 3 4 3   Difficult doing work/chores Somewhat difficult Not difficult at all  Somewhat difficult Somewhat difficult    Relevant past medical, surgical, family and social history reviewed and updated as indicated. Interim medical history since our last visit reviewed. Allergies and medications reviewed and updated.  Review of Systems  Constitutional: Negative.   Respiratory: Negative.    Cardiovascular: Negative.   Musculoskeletal: Negative.   Neurological: Negative.   Psychiatric/Behavioral: Negative.      Per HPI unless specifically indicated above     Objective:    BP 127/67 (BP Location: Left Arm)   Pulse 61   Wt 167 lb (75.8 kg)   BMI 24.66 kg/m   Wt Readings from Last 3 Encounters:  02/06/24 167 lb (75.8 kg)  10/30/23 168 lb 6.4 oz (76.4 kg)  09/18/23 163 lb 12.8 oz (74.3 kg)    Physical Exam Vitals and nursing note reviewed.  Constitutional:      General: He is not in acute distress.    Appearance: Normal appearance. He is not ill-appearing, toxic-appearing or diaphoretic.  HENT:     Head: Normocephalic and atraumatic.     Right Ear: External ear  normal.     Left Ear: External ear normal.     Nose: Nose normal.     Mouth/Throat:     Mouth: Mucous membranes are moist.     Pharynx: Oropharynx is clear.  Eyes:     General: No scleral icterus.       Right eye: No discharge.        Left eye: No discharge.     Extraocular Movements: Extraocular movements intact.     Conjunctiva/sclera: Conjunctivae normal.     Pupils: Pupils are equal, round, and reactive to light.  Cardiovascular:      Rate and Rhythm: Normal rate and regular rhythm.     Pulses: Normal pulses.     Heart sounds: Normal heart sounds. No murmur heard.    No friction rub. No gallop.  Pulmonary:     Effort: Pulmonary effort is normal. No respiratory distress.     Breath sounds: Normal breath sounds. No stridor. No wheezing, rhonchi or rales.  Chest:     Chest wall: No tenderness.  Musculoskeletal:        General: Normal range of motion.     Cervical back: Normal range of motion and neck supple.  Skin:    General: Skin is warm and dry.     Capillary Refill: Capillary refill takes less than 2 seconds.     Coloration: Skin is not jaundiced or pale.     Findings: No bruising, erythema, lesion or rash.  Neurological:     General: No focal deficit present.     Mental Status: He is alert and oriented to person, place, and time. Mental status is at baseline.  Psychiatric:        Mood and Affect: Mood normal.        Behavior: Behavior normal.        Thought Content: Thought content normal.        Judgment: Judgment normal.     Results for orders placed or performed in visit on 10/30/23  Basic metabolic panel   Collection Time: 10/30/23  9:26 AM  Result Value Ref Range   Glucose 166 (H) 70 - 99 mg/dL   BUN 17 8 - 27 mg/dL   Creatinine, Ser 0.98 (L) 0.76 - 1.27 mg/dL   eGFR 119 >14 NW/GNF/6.21   BUN/Creatinine Ratio 23 10 - 24   Sodium 136 134 - 144 mmol/L   Potassium 4.8 3.5 - 5.2 mmol/L   Chloride 100 96 - 106 mmol/L   CO2 24 20 - 29 mmol/L   Calcium  9.8 8.6 - 10.2 mg/dL  CBC with Differential/Platelet   Collection Time: 10/30/23  9:26 AM  Result Value Ref Range   WBC 4.2 3.4 - 10.8 x10E3/uL   RBC 3.52 (L) 4.14 - 5.80 x10E6/uL   Hemoglobin 11.0 (L) 13.0 - 17.7 g/dL   Hematocrit 30.8 (L) 65.7 - 51.0 %   MCV 96 79 - 97 fL   MCH 31.3 26.6 - 33.0 pg   MCHC 32.7 31.5 - 35.7 g/dL   RDW 84.6 96.2 - 95.2 %   Platelets 242 150 - 450 x10E3/uL   Neutrophils 57 Not Estab. %   Lymphs 26 Not Estab. %    Monocytes 11 Not Estab. %   Eos 4 Not Estab. %   Basos 1 Not Estab. %   Neutrophils Absolute 2.4 1.4 - 7.0 x10E3/uL   Lymphocytes Absolute 1.1 0.7 - 3.1 x10E3/uL   Monocytes Absolute 0.5 0.1 - 0.9 x10E3/uL  EOS (ABSOLUTE) 0.2 0.0 - 0.4 x10E3/uL   Basophils Absolute 0.1 0.0 - 0.2 x10E3/uL   Immature Granulocytes 1 Not Estab. %   Immature Grans (Abs) 0.0 0.0 - 0.1 x10E3/uL      Assessment & Plan:   Problem List Items Addressed This Visit       Cardiovascular and Mediastinum   Hypertension - Primary   Under good control on current regimen. Continue current regimen. Continue to monitor. Call with any concerns. Refills given. Labs drawn today.        Relevant Medications   atorvastatin  (LIPITOR) 40 MG tablet   benazepril  (LOTENSIN ) 20 MG tablet   Other Relevant Orders   CBC with Differential/Platelet   Diabetes mellitus with peripheral vascular disease (HCC)   Stable with A1c of 7.1. Continue current regimen. Continue to monitor. Call with any concerns.       Relevant Medications   atorvastatin  (LIPITOR) 40 MG tablet   benazepril  (LOTENSIN ) 20 MG tablet   glyBURIDE  (DIABETA ) 5 MG tablet   metFORMIN  (GLUCOPHAGE ) 500 MG tablet   Other Relevant Orders   Bayer DCA Hb A1c Waived   Comprehensive metabolic panel with GFR   CBC with Differential/Platelet   Lipid Panel w/o Chol/HDL Ratio   TSH   Microalbumin, Urine Waived     Other   Hyperlipidemia   Under good control on current regimen. Continue current regimen. Continue to monitor. Call with any concerns. Refills given. Labs drawn today.       Relevant Medications   atorvastatin  (LIPITOR) 40 MG tablet   benazepril  (LOTENSIN ) 20 MG tablet   Other Relevant Orders   CBC with Differential/Platelet   Iron  (Fe) deficiency anemia   Rechecking labs today. Due for colonoscopy. Ordered today      Relevant Medications   ferrous sulfate  325 (65 FE) MG tablet   Other Relevant Orders   CBC with Differential/Platelet   Benign  prostatic hyperplasia with nocturia   Would like to hold his flomax  right now. Call with any concerns. Continue to monitor.       Relevant Orders   CBC with Differential/Platelet   PSA   Other Visit Diagnoses       Chronic pain of both knees       Has been taking his wife's celebrex - will get him his own Rx. Will call with any concerns. Continue to monitor.   Relevant Medications   celecoxib  (CELEBREX ) 100 MG capsule   escitalopram  (LEXAPRO ) 5 MG tablet     Screening for colon cancer       Referral to GI placed today. Await their input.   Relevant Orders   Ambulatory referral to Gastroenterology     Need for pneumococcal vaccination       Prevnar given today.   Relevant Orders   Pneumococcal conjugate vaccine 20-valent (Prevnar 20) (Completed)        Follow up plan: Return in about 3 months (around 05/07/2024) for 40 minute WELCOME TO MEDICARE.

## 2024-02-07 ENCOUNTER — Encounter: Payer: Self-pay | Admitting: Family Medicine

## 2024-02-07 LAB — COMPREHENSIVE METABOLIC PANEL WITH GFR
ALT: 18 IU/L (ref 0–44)
AST: 15 IU/L (ref 0–40)
Albumin: 4.4 g/dL (ref 3.9–4.9)
Alkaline Phosphatase: 54 IU/L (ref 44–121)
BUN/Creatinine Ratio: 23 (ref 10–24)
BUN: 19 mg/dL (ref 8–27)
Bilirubin Total: 0.3 mg/dL (ref 0.0–1.2)
CO2: 23 mmol/L (ref 20–29)
Calcium: 9.8 mg/dL (ref 8.6–10.2)
Chloride: 103 mmol/L (ref 96–106)
Creatinine, Ser: 0.83 mg/dL (ref 0.76–1.27)
Globulin, Total: 2 g/dL (ref 1.5–4.5)
Glucose: 121 mg/dL — ABNORMAL HIGH (ref 70–99)
Potassium: 4.3 mmol/L (ref 3.5–5.2)
Sodium: 138 mmol/L (ref 134–144)
Total Protein: 6.4 g/dL (ref 6.0–8.5)
eGFR: 97 mL/min/{1.73_m2} (ref >59)

## 2024-02-07 LAB — CBC WITH DIFFERENTIAL/PLATELET
Basophils Absolute: 0.1 10*3/uL (ref 0.0–0.2)
Basos: 2 %
EOS (ABSOLUTE): 0.2 10*3/uL (ref 0.0–0.4)
Eos: 5 %
Hematocrit: 33.7 % — ABNORMAL LOW (ref 37.5–51.0)
Hemoglobin: 11.1 g/dL — ABNORMAL LOW (ref 13.0–17.7)
Immature Grans (Abs): 0 10*3/uL (ref 0.0–0.1)
Immature Granulocytes: 0 %
Lymphocytes Absolute: 1 10*3/uL (ref 0.7–3.1)
Lymphs: 23 %
MCH: 31.5 pg (ref 26.6–33.0)
MCHC: 32.9 g/dL (ref 31.5–35.7)
MCV: 96 fL (ref 79–97)
Monocytes Absolute: 0.5 10*3/uL (ref 0.1–0.9)
Monocytes: 12 %
Neutrophils Absolute: 2.6 10*3/uL (ref 1.4–7.0)
Neutrophils: 58 %
Platelets: 231 10*3/uL (ref 150–450)
RBC: 3.52 x10E6/uL — ABNORMAL LOW (ref 4.14–5.80)
RDW: 13.1 % (ref 11.6–15.4)
WBC: 4.5 10*3/uL (ref 3.4–10.8)

## 2024-02-07 LAB — LIPID PANEL W/O CHOL/HDL RATIO
Cholesterol, Total: 111 mg/dL (ref 100–199)
HDL: 36 mg/dL — ABNORMAL LOW (ref >39)
LDL Chol Calc (NIH): 59 mg/dL (ref 0–99)
Triglycerides: 81 mg/dL (ref 0–149)
VLDL Cholesterol Cal: 16 mg/dL (ref 5–40)

## 2024-02-07 LAB — TSH: TSH: 0.787 u[IU]/mL (ref 0.450–4.500)

## 2024-02-07 LAB — PSA: Prostate Specific Ag, Serum: 0.3 ng/mL (ref 0.0–4.0)

## 2024-03-02 ENCOUNTER — Encounter: Payer: Self-pay | Admitting: *Deleted

## 2024-04-06 ENCOUNTER — Encounter: Payer: Self-pay | Admitting: Oncology

## 2024-04-07 ENCOUNTER — Encounter: Payer: Self-pay | Admitting: Family Medicine

## 2024-04-07 ENCOUNTER — Ambulatory Visit (INDEPENDENT_AMBULATORY_CARE_PROVIDER_SITE_OTHER): Admitting: Family Medicine

## 2024-04-07 VITALS — BP 114/65 | HR 92 | Temp 98.9°F | Ht 69.0 in | Wt 161.0 lb

## 2024-04-07 DIAGNOSIS — J209 Acute bronchitis, unspecified: Secondary | ICD-10-CM

## 2024-04-07 MED ORDER — DOXYCYCLINE HYCLATE 100 MG PO TABS: 100.0000 mg | ORAL_TABLET | Freq: Two times a day (BID) | ORAL | 0 refills | Status: DC

## 2024-04-07 MED ORDER — PREDNISONE 10 MG PO TABS: ORAL_TABLET | ORAL | 0 refills | Status: DC

## 2024-04-07 MED ORDER — METHYLPREDNISOLONE SODIUM SUCC 40 MG IJ SOLR
40.0000 mg | Freq: Once | INTRAMUSCULAR | Status: AC
Start: 2024-04-07 — End: 2024-04-07
  Administered 2024-04-07: 40 mg via INTRAMUSCULAR

## 2024-04-07 MED ORDER — TRIAMCINOLONE ACETONIDE 40 MG/ML IJ SUSP: 40.0000 mg | Freq: Once | INTRAMUSCULAR | Status: DC

## 2024-04-07 NOTE — Progress Notes (Signed)
 BP 114/65 (BP Location: Left Arm, Patient Position: Sitting, Cuff Size: Normal)   Pulse 92   Temp 98.9 F (37.2 C) (Oral)   Ht 5' 9 (1.753 m)   Wt 161 lb (73 kg)   SpO2 96%   BMI 23.78 kg/m    Subjective:    Patient ID: Stanley Dickerson, male    DOB: April 23, 1961, 63 y.o.   MRN: [id number]  HPI: Stanley Dickerson is a 63 y.o. male  Chief Complaint  Patient presents with   Cough    Onset about a little over a week   chest congestion    Nasal Congestion   Diarrhea   UPPER RESPIRATORY TRACT INFECTION Duration: about 10 days Worst symptom: chest congestion and mucous Fever: yes Cough: yes Shortness of breath: no Wheezing: no Chest pain: yes, with cough Chest tightness: no Chest congestion: yes Nasal congestion: yes Runny nose: yes Post nasal drip: yes Sneezing: yes Sore throat: yes Swollen glands: yes Sinus pressure: no Headache: yes Face pain: no Toothache: yes Ear pain: no  Ear pressure: no  Eyes red/itching:no Eye drainage/crusting: no  Vomiting: no Rash: no Fatigue: yes Sick contacts: yes Strep contacts: no  Context: worse Recurrent sinusitis: no Relief with OTC cold/cough medications: no  Treatments attempted: cold and sinus, tylenol    Relevant past medical, surgical, family and social history reviewed and updated as indicated. Interim medical history since our last visit reviewed. Allergies and medications reviewed and updated.  Review of Systems  Constitutional:  Positive for diaphoresis, fatigue and fever. Negative for activity change, appetite change, chills and unexpected weight change.  HENT:  Positive for congestion, postnasal drip, rhinorrhea, sinus pressure, sneezing and sore throat. Negative for dental problem, drooling, ear discharge, ear pain, facial swelling, hearing loss, mouth sores, nosebleeds, sinus pain, tinnitus, trouble swallowing and voice change.   Respiratory:  Positive for cough and chest tightness. Negative for apnea,  choking, shortness of breath, wheezing and stridor.   Cardiovascular: Negative.     Per HPI unless specifically indicated above     Objective:    BP 114/65 (BP Location: Left Arm, Patient Position: Sitting, Cuff Size: Normal)   Pulse 92   Temp 98.9 F (37.2 C) (Oral)   Ht 5' 9 (1.753 m)   Wt 161 lb (73 kg)   SpO2 96%   BMI 23.78 kg/m   Wt Readings from Last 3 Encounters:  04/07/24 161 lb (73 kg)  02/06/24 167 lb (75.8 kg)  10/30/23 168 lb 6.4 oz (76.4 kg)    Physical Exam Vitals and nursing note reviewed.  Constitutional:      General: He is not in acute distress.    Appearance: Normal appearance. He is not ill-appearing, toxic-appearing or diaphoretic.  HENT:     Head: Normocephalic and atraumatic.     Right Ear: Tympanic membrane, ear canal and external ear normal. There is no impacted cerumen.     Left Ear: Tympanic membrane, ear canal and external ear normal. There is no impacted cerumen.     Nose: Nose normal. No congestion or rhinorrhea.     Mouth/Throat:     Mouth: Mucous membranes are moist.     Pharynx: Oropharynx is clear. No oropharyngeal exudate or posterior oropharyngeal erythema.   Eyes:     General: No scleral icterus.       Right eye: No discharge.        Left eye: No discharge.     Extraocular Movements: Extraocular movements intact.  Conjunctiva/sclera: Conjunctivae normal.     Pupils: Pupils are equal, round, and reactive to light.    Cardiovascular:     Rate and Rhythm: Normal rate and regular rhythm.     Pulses: Normal pulses.     Heart sounds: Normal heart sounds. No murmur heard.    No friction rub. No gallop.  Pulmonary:     Effort: Pulmonary effort is normal. No respiratory distress.     Breath sounds: No stridor. Wheezing and rhonchi present. No rales.  Chest:     Chest wall: No tenderness.   Musculoskeletal:        General: Normal range of motion.     Cervical back: Normal range of motion and neck supple.   Skin:    General:  Skin is warm and dry.     Capillary Refill: Capillary refill takes less than 2 seconds.     Coloration: Skin is not jaundiced or pale.     Findings: No bruising, erythema, lesion or rash.   Neurological:     General: No focal deficit present.     Mental Status: He is alert and oriented to person, place, and time. Mental status is at baseline.   Psychiatric:        Mood and Affect: Mood normal.        Behavior: Behavior normal.        Thought Content: Thought content normal.        Judgment: Judgment normal.     Results for orders placed or performed in visit on 02/10/24  HM DIABETES EYE EXAM   Collection Time: 11/06/23 12:25 PM  Result Value Ref Range   HM Diabetic Eye Exam No Retinopathy No Retinopathy      Assessment & Plan:   Problem List Items Addressed This Visit   None Visit Diagnoses       Acute bronchitis, unspecified organism    -  Primary   Will treat with prednisone  and doxycycline . Return for lung recheck in about 2 weeks. Call with any concerns.   Relevant Medications   methylPREDNISolone sodium succinate (SOLU-MEDROL) 40 mg/mL injection 40 mg (Start on 04/07/2024  3:45 PM)        Follow up plan: Return in about 3 weeks (around 04/28/2024) for ok to double book.

## 2024-04-28 ENCOUNTER — Encounter: Payer: Self-pay | Admitting: Family Medicine

## 2024-04-28 ENCOUNTER — Ambulatory Visit (INDEPENDENT_AMBULATORY_CARE_PROVIDER_SITE_OTHER): Admitting: Family Medicine

## 2024-04-28 VITALS — BP 124/60 | HR 73 | Temp 98.4°F | Resp 15 | Ht 69.02 in | Wt 167.2 lb

## 2024-04-28 DIAGNOSIS — D500 Iron deficiency anemia secondary to blood loss (chronic): Secondary | ICD-10-CM

## 2024-04-28 DIAGNOSIS — J209 Acute bronchitis, unspecified: Secondary | ICD-10-CM | POA: Diagnosis not present

## 2024-04-28 MED ORDER — BUDESONIDE-FORMOTEROL FUMARATE 80-4.5 MCG/ACT IN AERO: 2.0000 | INHALATION_SPRAY | Freq: Two times a day (BID) | RESPIRATORY_TRACT | 3 refills | Status: AC

## 2024-04-28 MED ORDER — ALBUTEROL SULFATE HFA 108 (90 BASE) MCG/ACT IN AERS: 2.0000 | INHALATION_SPRAY | Freq: Four times a day (QID) | RESPIRATORY_TRACT | 0 refills | Status: AC | PRN

## 2024-04-28 NOTE — Assessment & Plan Note (Signed)
 Didn't hear from hematology. New referral placed today.

## 2024-04-28 NOTE — Progress Notes (Signed)
 BP 124/60 (BP Location: Left Arm, Patient Position: Sitting, Cuff Size: Normal)   Pulse 73   Temp 98.4 F (36.9 C) (Oral)   Resp 15   Ht 5' 9.02 (1.753 m)   Wt 167 lb 3.2 oz (75.8 kg)   SpO2 98%   BMI 24.68 kg/m    Subjective:    Patient ID: Stanley Dickerson, male    DOB: [birth year], 63 y.o.   MRN: [id number]  HPI: Stanley Dickerson is a 63 y.o. male  Chief Complaint  Patient presents with   Bronchitis    Just now getting better. Feels 80% and very weak. Not yet able to work a full day.    Feeling about 80-85% better. Still feeling really tired. Unable to work a whole day because he's getting tired and a bit winded. He is otherwise doing OK. No other concerns.   Relevant past medical, surgical, family and social history reviewed and updated as indicated. Interim medical history since our last visit reviewed. Allergies and medications reviewed and updated.  Review of Systems  Constitutional:  Positive for fatigue. Negative for activity change, appetite change, chills, diaphoresis, fever and unexpected weight change.  Respiratory: Negative.    Cardiovascular: Negative.   Musculoskeletal: Negative.   Psychiatric/Behavioral: Negative.      Per HPI unless specifically indicated above     Objective:    BP 124/60 (BP Location: Left Arm, Patient Position: Sitting, Cuff Size: Normal)   Pulse 73   Temp 98.4 F (36.9 C) (Oral)   Resp 15   Ht 5' 9.02 (1.753 m)   Wt 167 lb 3.2 oz (75.8 kg)   SpO2 98%   BMI 24.68 kg/m   Wt Readings from Last 3 Encounters:  04/28/24 167 lb 3.2 oz (75.8 kg)  04/07/24 161 lb (73 kg)  02/06/24 167 lb (75.8 kg)    Physical Exam Vitals and nursing note reviewed.  Constitutional:      General: He is not in acute distress.    Appearance: Normal appearance. He is not ill-appearing, toxic-appearing or diaphoretic.  HENT:     Head: Normocephalic and atraumatic.     Right Ear: External ear normal.     Left Ear: External ear normal.     Nose:  Nose normal.     Mouth/Throat:     Mouth: Mucous membranes are moist.     Pharynx: Oropharynx is clear.  Eyes:     General: No scleral icterus.       Right eye: No discharge.        Left eye: No discharge.     Extraocular Movements: Extraocular movements intact.     Conjunctiva/sclera: Conjunctivae normal.     Pupils: Pupils are equal, round, and reactive to light.  Cardiovascular:     Rate and Rhythm: Normal rate and regular rhythm.     Pulses: Normal pulses.     Heart sounds: Normal heart sounds. No murmur heard.    No friction rub. No gallop.  Pulmonary:     Effort: Pulmonary effort is normal. No respiratory distress.     Breath sounds: Normal breath sounds. No stridor. No wheezing, rhonchi or rales.  Chest:     Chest wall: No tenderness.  Musculoskeletal:        General: Normal range of motion.     Cervical back: Normal range of motion and neck supple.  Skin:    General: Skin is warm and dry.     Capillary Refill: Capillary refill takes  less than 2 seconds.     Coloration: Skin is not jaundiced or pale.     Findings: No bruising, erythema, lesion or rash.  Neurological:     General: No focal deficit present.     Mental Status: He is alert and oriented to person, place, and time. Mental status is at baseline.  Psychiatric:        Mood and Affect: Mood normal.        Behavior: Behavior normal.        Thought Content: Thought content normal.        Judgment: Judgment normal.     Results for orders placed or performed in visit on 02/10/24  HM DIABETES EYE EXAM   Collection Time: 11/06/23 12:25 PM  Result Value Ref Range   HM Diabetic Eye Exam No Retinopathy No Retinopathy      Assessment & Plan:   Problem List Items Addressed This Visit       Other   Iron  (Fe) deficiency anemia   Didn't hear from hematology. New referral placed today.       Relevant Orders   Ambulatory referral to Hematology / Oncology   Other Visit Diagnoses       Acute bronchitis,  unspecified organism    -  Primary   Resolved. Lungs clear- still SOB. Will start symbicort  and albuterol  and recheck at physical. Call with any concerns.        Follow up plan: Return for As scheduled.  >15 minutes spent with patient today

## 2024-05-07 ENCOUNTER — Encounter: Payer: Self-pay | Admitting: Family Medicine

## 2024-05-07 ENCOUNTER — Ambulatory Visit (INDEPENDENT_AMBULATORY_CARE_PROVIDER_SITE_OTHER): Admitting: Family Medicine

## 2024-05-07 VITALS — BP 128/64 | HR 70 | Temp 97.5°F | Ht 69.0 in | Wt 165.2 lb

## 2024-05-07 DIAGNOSIS — R9431 Abnormal electrocardiogram [ECG] [EKG]: Secondary | ICD-10-CM

## 2024-05-07 DIAGNOSIS — R351 Nocturia: Secondary | ICD-10-CM | POA: Diagnosis not present

## 2024-05-07 DIAGNOSIS — E1151 Type 2 diabetes mellitus with diabetic peripheral angiopathy without gangrene: Secondary | ICD-10-CM

## 2024-05-07 DIAGNOSIS — I10 Essential (primary) hypertension: Secondary | ICD-10-CM | POA: Diagnosis not present

## 2024-05-07 DIAGNOSIS — Z1283 Encounter for screening for malignant neoplasm of skin: Secondary | ICD-10-CM

## 2024-05-07 DIAGNOSIS — Z136 Encounter for screening for cardiovascular disorders: Secondary | ICD-10-CM

## 2024-05-07 DIAGNOSIS — D692 Other nonthrombocytopenic purpura: Secondary | ICD-10-CM

## 2024-05-07 DIAGNOSIS — Z789 Other specified health status: Secondary | ICD-10-CM

## 2024-05-07 DIAGNOSIS — D500 Iron deficiency anemia secondary to blood loss (chronic): Secondary | ICD-10-CM

## 2024-05-07 DIAGNOSIS — Z0000 Encounter for general adult medical examination without abnormal findings: Secondary | ICD-10-CM

## 2024-05-07 DIAGNOSIS — N401 Enlarged prostate with lower urinary tract symptoms: Secondary | ICD-10-CM

## 2024-05-07 DIAGNOSIS — E7800 Pure hypercholesterolemia, unspecified: Secondary | ICD-10-CM | POA: Diagnosis not present

## 2024-05-07 DIAGNOSIS — F419 Anxiety disorder, unspecified: Secondary | ICD-10-CM

## 2024-05-07 LAB — MICROALBUMIN, URINE WAIVED
Creatinine, Urine Waived: 200 mg/dL (ref 10–300)
Microalb, Ur Waived: 80 mg/L — ABNORMAL HIGH (ref 0–19)

## 2024-05-07 LAB — BAYER DCA HB A1C WAIVED: HB A1C (BAYER DCA - WAIVED): 7 % — ABNORMAL HIGH (ref 4.8–5.6)

## 2024-05-07 NOTE — Assessment & Plan Note (Signed)
 Reassured patient. Continue to monitor.

## 2024-05-07 NOTE — Assessment & Plan Note (Signed)
 Improved with A1c down to 7.0 from 7.1. Continue current regimen. Continue diet and exercise. Call with any concerns.

## 2024-05-07 NOTE — Progress Notes (Signed)
 Subjective:   Stanley Dickerson is a 63 y.o. male who presents for an Initial Medicare Annual Wellness Visit.  Visit Complete: In person  Patient Medicare AWV questionnaire was completed by the patient on 05/07/24; I have confirmed that all information answered by patient is correct and no changes since this date.  Cardiac Risk Factors include: advanced age (>85men, >70 women);diabetes mellitus;hypertension;male gender     Objective:    Today's Vitals   05/07/24 0823 05/07/24 0942  BP: (!) 145/83 128/64  Pulse: 70   Temp: (!) 97.5 F (36.4 C)   TempSrc: Oral   SpO2: 96%   Weight: 165 lb 3.2 oz (74.9 kg)   Height: 5' 9 (1.753 m)   PainSc: 0-No pain    Body mass index is 24.4 kg/m.     05/07/2024    9:32 AM 07/15/2018   10:07 AM 05/13/2018    1:52 PM 05/12/2018    9:35 AM 08/10/2016    7:33 AM 04/09/2015    1:25 PM  Advanced Directives  Does Patient Have a Medical Advance Directive? No No  No  No  No  No   Would patient like information on creating a medical advance directive?   No - Patient declined  No - Patient declined   Yes - Educational materials given      Data saved with a previous flowsheet row definition    Current Medications (verified) Outpatient Encounter Medications as of 05/07/2024  Medication Sig   albuterol  (VENTOLIN  HFA) 108 (90 Base) MCG/ACT inhaler Inhale 2 puffs into the lungs every 6 (six) hours as needed for wheezing or shortness of breath.   aspirin EC 81 MG tablet Take 81 mg by mouth daily.   atorvastatin  (LIPITOR) 40 MG tablet Take 1 tablet (40 mg total) by mouth every other day.   benazepril  (LOTENSIN ) 20 MG tablet Take 1.5 tablets (30 mg total) by mouth daily.   budesonide -formoterol  (SYMBICORT ) 80-4.5 MCG/ACT inhaler Inhale 2 puffs into the lungs 2 (two) times daily.   celecoxib  (CELEBREX ) 100 MG capsule Take 1 capsule (100 mg total) by mouth 2 (two) times daily.   escitalopram  (LEXAPRO ) 5 MG tablet Take 1 tablet (5 mg total) by mouth daily.    ferrous sulfate  325 (65 FE) MG tablet Take 1 tablet (325 mg total) by mouth 2 (two) times daily with a meal.   glyBURIDE  (DIABETA ) 5 MG tablet Take 1 tablet (5 mg total) by mouth daily with breakfast.   metFORMIN  (GLUCOPHAGE ) 500 MG tablet Take 2 tablets (1,000 mg total) by mouth 2 (two) times daily with a meal.   No facility-administered encounter medications on file as of 05/07/2024.    Allergies (verified) Patient has no known allergies.   History: Past Medical History:  Diagnosis Date   Diabetes mellitus without complication (HCC)    Hyperlipidemia    Hypertension    Past Surgical History:  Procedure Laterality Date   CATARACT EXTRACTION     COLONOSCOPY WITH PROPOFOL  N/A 08/10/2016   Procedure: COLONOSCOPY WITH PROPOFOL ;  Surgeon: Ruel Kung, MD;  Location: [facility] ENDOSCOPY;  Service: Endoscopy;  Laterality: N/A;   ESOPHAGOGASTRODUODENOSCOPY (EGD) WITH PROPOFOL  N/A 08/10/2016   Procedure: ESOPHAGOGASTRODUODENOSCOPY (EGD) WITH PROPOFOL ;  Surgeon: Ruel Kung, MD;  Location: [facility] ENDOSCOPY;  Service: Endoscopy;  Laterality: N/A;   GIVENS CAPSULE STUDY N/A 07/03/2018   Procedure: GIVENS CAPSULE STUDY;  Surgeon: Kung Ruel, MD;  Location: [facility] ENDOSCOPY;  Service: Gastroenterology;  Laterality: N/A;   HERNIA REPAIR  TONSILLECTOMY AND ADENOIDECTOMY     Family History  Problem Relation Age of Onset   Depression Mother    Thyroid disease Mother    Alzheimer's disease Mother    Alcohol abuse Father    Lung cancer Father    Throat cancer Father    Alzheimer's disease Maternal Grandmother    Diabetes Sister    Hypertension Brother    Social History   Socioeconomic History   Marital status: Single    Spouse name: Not on file   Number of children: Not on file   Years of education: Not on file   Highest education level: Not on file  Occupational History   Occupation: Network engineer of [employer]   Tobacco Use   Smoking status: Never   Smokeless tobacco: Never  Vaping Use    Vaping status: Never Used  Substance and Sexual Activity   Alcohol use: Not Currently    Comment: rare   Drug use: No   Sexual activity: Not Currently  Other Topics Concern   Not on file  Social History Narrative   Not on file   Social Drivers of Health   Financial Resource Strain: Low Risk  (04/28/2024)   Overall Financial Resource Strain (CARDIA)    Difficulty of Paying Living Expenses: Not hard at all  Food Insecurity: No Food Insecurity (04/28/2024)   Hunger Vital Sign    Worried About Running Out of Food in the Last Year: Never true    Ran Out of Food in the Last Year: Never true  Transportation Needs: No Transportation Needs (04/28/2024)   PRAPARE - Administrator, Civil Service (Medical): No    Lack of Transportation (Non-Medical): No  Physical Activity: Sufficiently Active (04/28/2024)   Exercise Vital Sign    Days of Exercise per Week: 7 days    Minutes of Exercise per Session: 150+ min  Stress: No Stress Concern Present (04/28/2024)   [employer] of Occupational Health - Occupational Stress Questionnaire    Feeling of Stress: Not at all  Social Connections: Socially Isolated (04/28/2024)   Social Connection and Isolation Panel    Frequency of Communication with Friends and Family: More than three times a week    Frequency of Social Gatherings with Friends and Family: Twice a week    Attends Religious Services: Never    Database administrator or Organizations: No    Attends Engineer, structural: Never    Marital Status: Divorced    Tobacco Counseling Counseling given: Not Answered   Clinical Intake:     Pain Score: 0-No pain                  Activities of Daily Living    05/07/2024    8:40 AM  In your present state of health, do you have any difficulty performing the following activities:  Hearing? 0  Vision? 0  Difficulty concentrating or making decisions? 0  Walking or climbing stairs? 0  Dressing or bathing? 0   Doing errands, shopping? 0  Preparing Food and eating ? N  Using the Toilet? N  In the past six months, have you accidently leaked urine? N  Do you have problems with loss of bowel control? N  Managing your Medications? Y  Managing your Finances? Y    Patient Care Team: Vicci Duwaine SQUIBB, DO as PCP - [facility]  Indicate any recent Medical Services you may have received from  other than [facility] providers in the past year (date may be approximate).     Assessment:   This is a routine wellness examination for Keenon.  Hearing/Vision screen Hearing Screening  Method: Audiometry   500Hz  1000Hz  2000Hz  4000Hz   Right ear 20 20 20 20   Left ear 20 20 20 20    Vision Screening   Right eye Left eye Both eyes  Without correction 20/200 20/50 20/40  With correction        Goals Addressed   None    Depression Screen    05/07/2024    8:43 AM 04/28/2024    2:56 PM 02/06/2024    1:48 PM 10/30/2023    9:13 AM 09/18/2023    8:50 AM 08/20/2023    8:53 AM 05/17/2023    9:35 AM  PHQ 2/9 Scores  PHQ - 2 Score 0 0 0 0 0 0 0  PHQ- 9 Score  1 2 1 3 4 3     Fall Risk    05/07/2024    8:43 AM 04/28/2024    2:56 PM 10/30/2023    9:13 AM 08/20/2023    8:53 AM 05/17/2023    9:35 AM  Fall Risk   Falls in the past year? 0 0 0 1 0  Number falls in past yr: 0 0 0 1 0  Injury with Fall? 0 0 0 0 0  Risk for fall due to :  No Fall Risks No Fall Risks History of fall(s) No Fall Risks  Follow up  Falls evaluation completed Falls evaluation completed Falls evaluation completed Falls evaluation completed    MEDICARE RISK AT HOME: Medicare Risk at Home Any stairs in or around the home?: Yes If so, are there any without handrails?: No Home free of loose throw rugs in walkways, pet beds, electrical cords, etc?: No Adequate lighting in your home to reduce risk of falls?: No Life alert?: No Use of a cane, walker or w/c?: No Grab bars in the bathroom?: Yes Shower  chair or bench in shower?: No Elevated toilet seat or a handicapped toilet?: No  TIMED UP AND GO:  Was the test performed? Yes  Length of time to ambulate 10 feet: 8 sec Gait steady and fast without use of assistive device    Cognitive Function:        05/07/2024    8:46 AM  6CIT Screen  What Year? 0 points  What month? 0 points  What time? 0 points  Count back from 20 0 points  Months in reverse 0 points  Repeat phrase 0 points  Total Score 0 points    Immunizations Immunization History  Administered Date(s) Administered   Influenza,inj,Quad PF,6+ Mos 10/20/2015, 11/09/2019, 11/10/2020, 08/08/2021, 08/07/2022   PFIZER(Purple Top)SARS-COV-2 Vaccination 12/11/2019, 01/01/2020, 09/06/2020   PNEUMOCOCCAL CONJUGATE-20 02/06/2024   Pfizer Covid-19 Vaccine Bivalent Booster 42yrs & up 08/08/2021   Pneumococcal-Unspecified 06/27/2010   Td 08/14/2011   Tdap 08/07/2022    TDAP status: Up to date  Flu due in the fall  Pneumococcal vaccine status: Up to date  Covid-19 vaccine status: Declined, Education has been provided regarding the importance of this vaccine but patient still declined. Advised may receive this vaccine at local pharmacy or Health Dept.or vaccine clinic. Aware to provide a copy of the vaccination record if obtained from local pharmacy or Health Dept. Verbalized acceptance and understanding.  Qualifies for Shingles Vaccine? Yes   Zostavax completed No   Shingrix Completed?: No.    Education has been  provided regarding the importance of this vaccine. Patient has been advised to call insurance company to determine out of pocket expense if they have not yet received this vaccine. Advised may also receive vaccine at local pharmacy or Health Dept. Verbalized acceptance and understanding.  Screening Tests Health Maintenance  Topic Date Due   Zoster Vaccines- Shingrix (1 of 2) Never done   Colonoscopy  08/11/2019   INFLUENZA VACCINE  05/15/2024   HEMOGLOBIN A1C   08/07/2024   FOOT EXAM  08/19/2024   OPHTHALMOLOGY EXAM  11/05/2024   Diabetic kidney evaluation - eGFR measurement  02/05/2025   Diabetic kidney evaluation - Urine ACR  02/05/2025   Medicare Annual Wellness (AWV)  05/07/2025   DTaP/Tdap/Td (3 - Td or Tdap) 08/07/2032   Pneumococcal Vaccine: 50+ Years  Completed   Hepatitis C Screening  Completed   HIV Screening  Addressed   Hepatitis B Vaccines  Aged Out   HPV VACCINES  Aged Out   Meningococcal B Vaccine  Aged Out   COVID-19 Vaccine  Discontinued    Health Maintenance  Health Maintenance Due  Topic Date Due   Zoster Vaccines- Shingrix (1 of 2) Never done   Colonoscopy  08/11/2019    Colorectal cancer screening: Type of screening: Colonoscopy. Completed 08/10/16. Repeat every 3 years  Lung Cancer Screening: (Low Dose CT Chest recommended if Age 20-80 years, 20 pack-year currently smoking OR have quit w/in 15years.) does not qualify.    Additional Screening:  Hepatitis C Screening: does qualify; Completed 07/31/18  Vision Screening: Recommended annual ophthalmology exams for early detection of glaucoma and other disorders of the eye. Is the patient up to date with their annual eye exam?  Yes  Who is the provider or what is the name of the office in which the patient attends annual eye exams? Dr Mevelyn If pt is not established with a provider, would they like to be referred to a provider to establish care? N/A.   Dental Screening: Recommended annual dental exams for proper oral hygiene  Diabetic Foot Exam: Diabetic Foot Exam: Completed 08/20/23  Community Resource Referral / Chronic Care Management: CRR required this visit?  No   CCM required this visit?  No    Plan:     I have personally reviewed and noted the following in the patient's chart:   Medical and social history Use of alcohol, tobacco or illicit drugs  Current medications and supplements including opioid prescriptions. Patient is not currently taking  opioid prescriptions. Functional ability and status Nutritional status Physical activity Advanced directives List of other physicians Hospitalizations, surgeries, and ER visits in previous 12 months Vitals Screenings to include cognitive, depression, and falls Referrals and appointments  In addition, I have reviewed and discussed with patient certain preventive protocols, quality metrics, and best practice recommendations. A written personalized care plan for preventive services as well as general preventive health recommendations were provided to patient.     Duwaine Louder, DO   05/07/2024   After Visit Summary: (In Person-Printed) AVS printed and given to the patient

## 2024-05-07 NOTE — Assessment & Plan Note (Signed)
 Rechecking labs today. Await results. Treat as needed.

## 2024-05-07 NOTE — Patient Instructions (Signed)
 Preventative Services:  AAA Screening: N/A Health Risk Assessment and Personalized Prevention Plan: Done today Bone Mass Measurements: N/A CVD Screening: Done today Colon Cancer Screening: Referral in- contact GI Depression Screening: Done today Diabetes Screening: Done today Glaucoma Screening: see your eye doctor Hepatitis B vaccine: will check immunity today Hepatitis C screening: up to date HIV Screening:up to date Flu Vaccine: get in the fall Lung cancer Screening: N/A Obesity Screening: done today Pneumonia Vaccines (2): up to date STI Screening: N/A PSA screening: done today   Stanley Dickerson , Thank you for taking time to come for your Medicare Wellness Visit. I appreciate your ongoing commitment to your health goals. Please review the following plan we discussed and let me know if I can assist you in the future.   These are the goals we discussed:  Goals   None     This is a list of the screening recommended for you and due dates:  Health Maintenance  Topic Date Due   Medicare Annual Wellness Visit  Never done   Zoster (Shingles) Vaccine (1 of 2) Never done   Colon Cancer Screening  08/11/2019   Flu Shot  05/15/2024   Hemoglobin A1C  08/07/2024   Complete foot exam   08/19/2024   Eye exam for diabetics  11/05/2024   Yearly kidney function blood test for diabetes  02/05/2025   Yearly kidney health urinalysis for diabetes  02/05/2025   DTaP/Tdap/Td vaccine (3 - Td or Tdap) 08/07/2032   Pneumococcal Vaccine for age over 51  Completed   Hepatitis C Screening  Completed   HIV Screening  Addressed   Hepatitis B Vaccine  Aged Out   HPV Vaccine  Aged Out   Meningitis B Vaccine  Aged Out   COVID-19 Vaccine  Discontinued

## 2024-05-07 NOTE — Progress Notes (Signed)
 BP 128/64   Pulse 70   Temp (!) 97.5 F (36.4 C) (Oral)   Ht 5' 9 (1.753 m)   Wt 165 lb 3.2 oz (74.9 kg)   SpO2 96%   BMI 24.40 kg/m    Subjective:    Patient ID: Stanley Dickerson, male    DOB: August 07, 1961, 63 y.o.   MRN: [id number]  HPI: Stanley Dickerson is a 63 y.o. male presenting on 05/07/2024 for comprehensive medical examination. Current medical complaints include:  DIABETES Hypoglycemic episodes:no Polydipsia/polyuria: no Visual disturbance: no Chest pain: no Paresthesias: yes Glucose Monitoring: yes  Accucheck frequency: Daily Taking Insulin?: no Blood Pressure Monitoring: rarely Retinal Examination: Up to Date Foot Exam: Up to Date Diabetic Education: Completed Pneumovax: Up to Date Influenza: Not up to Date Aspirin: yes  HYPERTENSION / HYPERLIPIDEMIA Satisfied with current treatment? yes Duration of hypertension: chronic BP monitoring frequency: not checking BP medication side effects: no Past BP meds: benazepril  Duration of hyperlipidemia: chronic Cholesterol medication side effects: no Cholesterol supplements: none Past cholesterol medications: atorvastatin  Medication compliance: excellent compliance Aspirin: yes Recent stressors: yes Recurrent headaches: no Visual changes: no Palpitations: no Dyspnea: no Chest pain: no Lower extremity edema: no Dizzy/lightheaded: no  ANXIETY/STRESS Duration: chronic Status:controlled Anxious mood: no  Excessive worrying: no Irritability: no  Sweating: no Nausea: no Palpitations:no Hyperventilation: no Panic attacks: no Agoraphobia: no  Obscessions/compulsions: no Depressed mood: no    05/07/2024    8:43 AM 04/28/2024    2:56 PM 02/06/2024    1:48 PM 10/30/2023    9:13 AM 09/18/2023    8:50 AM  Depression screen PHQ 2/9  Decreased Interest 0 0 0 0 0  Down, Depressed, Hopeless 0 0 0 0 0  PHQ - 2 Score 0 0 0 0 0  Altered sleeping  1 1 1 1   Tired, decreased energy  0 1 0 1  Change in appetite  0 0  0 0  Feeling bad or failure about yourself   0 0 0 0  Trouble concentrating  0 0 0 1  Moving slowly or fidgety/restless  0 0 0 0  Suicidal thoughts  0 0 0 0  PHQ-9 Score  1 2 1 3   Difficult doing work/chores   Somewhat difficult Not difficult at all    Anhedonia: no Weight changes: no Insomnia: no   Hypersomnia: no Fatigue/loss of energy: no Feelings of worthlessness: no Feelings of guilt: no Impaired concentration/indecisiveness: no Suicidal ideations: no  Crying spells: no Recent Stressors/Life Changes: no   Relationship problems: no   Family stress: no     Financial stress: no    Job stress: no    Recent death/loss: no  ANEMIA Anemia status: controlled Etiology of anemia: iron  deficiency Duration of anemia treatment: chronic Compliance with treatment: good compliance Iron  supplementation side effects: no Severity of anemia: mild Fatigue: no Decreased exercise tolerance: no  Dyspnea on exertion: no Palpitations: no Bleeding: no Pica: no  He currently lives with: wife and son Interim Problems from his last visit: no  Functional Status Survey: Is the patient deaf or have difficulty hearing?: No Does the patient have difficulty seeing, even when wearing glasses/contacts?: No Does the patient have difficulty concentrating, remembering, or making decisions?: No Does the patient have difficulty walking or climbing stairs?: No Does the patient have difficulty dressing or bathing?: No Does the patient have difficulty doing errands alone such as visiting a doctor's office or shopping?: No  FALL RISK:    05/07/2024  8:43 AM 04/28/2024    2:56 PM 10/30/2023    9:13 AM 08/20/2023    8:53 AM 05/17/2023    9:35 AM  Fall Risk   Falls in the past year? 0 0 0 1 0  Number falls in past yr: 0 0 0 1 0  Injury with Fall? 0 0 0 0 0  Risk for fall due to :  No Fall Risks No Fall Risks History of fall(s) No Fall Risks  Follow up  Falls evaluation completed Falls evaluation  completed Falls evaluation completed Falls evaluation completed    Depression Screen    05/07/2024    8:43 AM 04/28/2024    2:56 PM 02/06/2024    1:48 PM 10/30/2023    9:13 AM 09/18/2023    8:50 AM  Depression screen PHQ 2/9  Decreased Interest 0 0 0 0 0  Down, Depressed, Hopeless 0 0 0 0 0  PHQ - 2 Score 0 0 0 0 0  Altered sleeping  1 1 1 1   Tired, decreased energy  0 1 0 1  Change in appetite  0 0 0 0  Feeling bad or failure about yourself   0 0 0 0  Trouble concentrating  0 0 0 1  Moving slowly or fidgety/restless  0 0 0 0  Suicidal thoughts  0 0 0 0  PHQ-9 Score  1 2 1 3   Difficult doing work/chores   Somewhat difficult Not difficult at all     Advanced Directives Does patient have a HCPOA?    no If yes, name and contact information:  Does patient have a living will or MOST form?  no  Past Medical History:  Past Medical History:  Diagnosis Date   Diabetes mellitus without complication (HCC)    Hyperlipidemia    Hypertension     Surgical History:  Past Surgical History:  Procedure Laterality Date   CATARACT EXTRACTION     COLONOSCOPY WITH PROPOFOL  N/A 08/10/2016   Procedure: COLONOSCOPY WITH PROPOFOL ;  Surgeon: Ruel Kung, MD;  Location: [facility] ENDOSCOPY;  Service: Endoscopy;  Laterality: N/A;   ESOPHAGOGASTRODUODENOSCOPY (EGD) WITH PROPOFOL  N/A 08/10/2016   Procedure: ESOPHAGOGASTRODUODENOSCOPY (EGD) WITH PROPOFOL ;  Surgeon: Ruel Kung, MD;  Location: [facility] ENDOSCOPY;  Service: Endoscopy;  Laterality: N/A;   GIVENS CAPSULE STUDY N/A 07/03/2018   Procedure: GIVENS CAPSULE STUDY;  Surgeon: Kung Ruel, MD;  Location: [facility] ENDOSCOPY;  Service: Gastroenterology;  Laterality: N/A;   HERNIA REPAIR     TONSILLECTOMY AND ADENOIDECTOMY      Medications:  Current Outpatient Medications on File Prior to Visit  Medication Sig   albuterol  (VENTOLIN  HFA) 108 (90 Base) MCG/ACT inhaler Inhale 2 puffs into the lungs every 6 (six) hours as needed for wheezing or shortness of breath.    aspirin EC 81 MG tablet Take 81 mg by mouth daily.   atorvastatin  (LIPITOR) 40 MG tablet Take 1 tablet (40 mg total) by mouth every other day.   benazepril  (LOTENSIN ) 20 MG tablet Take 1.5 tablets (30 mg total) by mouth daily.   budesonide -formoterol  (SYMBICORT ) 80-4.5 MCG/ACT inhaler Inhale 2 puffs into the lungs 2 (two) times daily.   celecoxib  (CELEBREX ) 100 MG capsule Take 1 capsule (100 mg total) by mouth 2 (two) times daily.   escitalopram  (LEXAPRO ) 5 MG tablet Take 1 tablet (5 mg total) by mouth daily.   ferrous sulfate  325 (65 FE) MG tablet Take 1 tablet (325 mg total) by mouth 2 (two) times daily with a meal.  glyBURIDE  (DIABETA ) 5 MG tablet Take 1 tablet (5 mg total) by mouth daily with breakfast.   metFORMIN  (GLUCOPHAGE ) 500 MG tablet Take 2 tablets (1,000 mg total) by mouth 2 (two) times daily with a meal.   No current facility-administered medications on file prior to visit.    Allergies:  No Known Allergies  Social History:  Social History   Socioeconomic History   Marital status: Single    Spouse name: Not on file   Number of children: Not on file   Years of education: Not on file   Highest education level: Not on file  Occupational History   Occupation: Network engineer of [employer]   Tobacco Use   Smoking status: Never   Smokeless tobacco: Never  Vaping Use   Vaping status: Never Used  Substance and Sexual Activity   Alcohol use: Not Currently    Comment: rare   Drug use: No   Sexual activity: Not Currently  Other Topics Concern   Not on file  Social History Narrative   Not on file   Social Drivers of Health   Financial Resource Strain: Low Risk  (04/28/2024)   Overall Financial Resource Strain (CARDIA)    Difficulty of Paying Living Expenses: Not hard at all  Food Insecurity: No Food Insecurity (04/28/2024)   Hunger Vital Sign    Worried About Running Out of Food in the Last Year: Never true    Ran Out of Food in the Last Year: Never true   Transportation Needs: No Transportation Needs (04/28/2024)   PRAPARE - Administrator, Civil Service (Medical): No    Lack of Transportation (Non-Medical): No  Physical Activity: Sufficiently Active (04/28/2024)   Exercise Vital Sign    Days of Exercise per Week: 7 days    Minutes of Exercise per Session: 150+ min  Stress: No Stress Concern Present (04/28/2024)   [employer] of Occupational Health - Occupational Stress Questionnaire    Feeling of Stress: Not at all  Social Connections: Socially Isolated (04/28/2024)   Social Connection and Isolation Panel    Frequency of Communication with Friends and Family: More than three times a week    Frequency of Social Gatherings with Friends and Family: Twice a week    Attends Religious Services: Never    Database administrator or Organizations: No    Attends Banker Meetings: Never    Marital Status: Divorced  Catering manager Violence: Not At Risk (04/28/2024)   Humiliation, Afraid, Rape, and Kick questionnaire    Fear of Current or Ex-Partner: No    Emotionally Abused: No    Physically Abused: No    Sexually Abused: No   Social History   Tobacco Use  Smoking Status Never  Smokeless Tobacco Never   Social History   Substance and Sexual Activity  Alcohol Use Not Currently   Comment: rare    Family History:  Family History  Problem Relation Age of Onset   Depression Mother    Thyroid disease Mother    Alzheimer's disease Mother    Alcohol abuse Father    Lung cancer Father    Throat cancer Father    Alzheimer's disease Maternal Grandmother    Diabetes Sister    Hypertension Brother     Past medical history, surgical history, medications, allergies, family history and social history reviewed with patient today and changes made to appropriate areas of the chart.   Review of Systems  Constitutional:  Positive for  chills. Negative for diaphoresis, fever, malaise/fatigue and weight loss.  HENT:  Negative.    Eyes: Negative.   Respiratory: Negative.    Cardiovascular: Negative.   Gastrointestinal:  Positive for diarrhea and nausea (1x a month). Negative for abdominal pain, blood in stool, constipation, heartburn, melena and vomiting.  Genitourinary: Negative.   Musculoskeletal: Negative.   Skin: Negative.   Neurological:  Positive for tingling (R hand when driving). Negative for dizziness, tremors, sensory change, speech change, focal weakness, seizures, loss of consciousness, weakness and headaches.  Endo/Heme/Allergies:  Negative for environmental allergies and polydipsia. Bruises/bleeds easily.  Psychiatric/Behavioral: Negative.     All other ROS negative except what is listed above and in the HPI.      Objective:    BP 128/64   Pulse 70   Temp (!) 97.5 F (36.4 C) (Oral)   Ht 5' 9 (1.753 m)   Wt 165 lb 3.2 oz (74.9 kg)   SpO2 96%   BMI 24.40 kg/m   Wt Readings from Last 3 Encounters:  05/07/24 165 lb 3.2 oz (74.9 kg)  04/28/24 167 lb 3.2 oz (75.8 kg)  04/07/24 161 lb (73 kg)    Hearing Screening  Method: Audiometry   500Hz  1000Hz  2000Hz  4000Hz   Right ear 20 20 20 20   Left ear 20 20 20 20    Vision Screening   Right eye Left eye Both eyes  Without correction 20/200 20/50 20/40  With correction       Physical Exam Vitals and nursing note reviewed.  Constitutional:      General: He is not in acute distress.    Appearance: Normal appearance. He is obese. He is not ill-appearing, toxic-appearing or diaphoretic.  HENT:     Head: Normocephalic and atraumatic.     Right Ear: Tympanic membrane, ear canal and external ear normal. There is no impacted cerumen.     Left Ear: Tympanic membrane, ear canal and external ear normal. There is no impacted cerumen.     Nose: Nose normal. No congestion or rhinorrhea.     Mouth/Throat:     Mouth: Mucous membranes are moist.     Pharynx: Oropharynx is clear. No oropharyngeal exudate or posterior oropharyngeal erythema.   Eyes:     General: No scleral icterus.       Right eye: No discharge.        Left eye: No discharge.     Extraocular Movements: Extraocular movements intact.     Conjunctiva/sclera: Conjunctivae normal.     Pupils: Pupils are equal, round, and reactive to light.  Neck:     Vascular: No carotid bruit.  Cardiovascular:     Rate and Rhythm: Normal rate and regular rhythm.     Pulses: Normal pulses.     Heart sounds: No murmur heard.    No friction rub. No gallop.  Pulmonary:     Effort: Pulmonary effort is normal. No respiratory distress.     Breath sounds: Normal breath sounds. No stridor. No wheezing, rhonchi or rales.  Chest:     Chest wall: No tenderness.  Abdominal:     General: Abdomen is flat. Bowel sounds are normal. There is no distension.     Palpations: Abdomen is soft. There is no mass.     Tenderness: There is no abdominal tenderness. There is no right CVA tenderness, left CVA tenderness, guarding or rebound.     Hernia: No hernia is present.  Genitourinary:    Comments: Genital exam deferred with  shared decision making Musculoskeletal:        General: No swelling, tenderness, deformity or signs of injury.     Cervical back: Normal range of motion and neck supple. No rigidity. No muscular tenderness.     Right lower leg: No edema.     Left lower leg: No edema.  Lymphadenopathy:     Cervical: No cervical adenopathy.  Skin:    General: Skin is warm and dry.     Capillary Refill: Capillary refill takes less than 2 seconds.     Coloration: Skin is not jaundiced or pale.     Findings: No bruising, erythema, lesion or rash.     Comments: >100 moles and freckles on his back with extensive sun damage  Neurological:     General: No focal deficit present.     Mental Status: He is alert and oriented to person, place, and time.     Cranial Nerves: No cranial nerve deficit.     Sensory: No sensory deficit.     Motor: No weakness.     Coordination: Coordination normal.      Gait: Gait normal.     Deep Tendon Reflexes: Reflexes normal.  Psychiatric:        Mood and Affect: Mood normal.        Behavior: Behavior normal.        Thought Content: Thought content normal.        Judgment: Judgment normal.        05/07/2024    8:46 AM  6CIT Screen  What Year? 0 points  What month? 0 points  What time? 0 points  Count back from 20 0 points  Months in reverse 0 points  Repeat phrase 0 points  Total Score 0 points    Results for orders placed or performed in visit on 02/10/24  HM DIABETES EYE EXAM   Collection Time: 11/06/23 12:25 PM  Result Value Ref Range   HM Diabetic Eye Exam No Retinopathy No Retinopathy      Assessment & Plan:   Problem List Items Addressed This Visit       Cardiovascular and Mediastinum   Hypertension   Better on recheck. Continue current regimen. Continue to monitor. Call with any concerns.       Relevant Orders   Comprehensive metabolic panel with GFR   TSH   Diabetes mellitus with peripheral vascular disease (HCC)   Improved with A1c down to 7.0 from 7.1. Continue current regimen. Continue diet and exercise. Call with any concerns.      Relevant Orders   Bayer DCA Hb A1c Waived   Microalbumin, Urine Waived   Comprehensive metabolic panel with GFR   TSH   Senile purpura (HCC)   Reassured patient. Continue to monitor.         Other   Anxiety (Chronic)   Hyperlipidemia   Under good control on current regimen. Continue current regimen. Continue to monitor. Call with any concerns. Refills given. Labs drawn today.        Relevant Orders   Comprehensive metabolic panel with GFR   Lipid Panel w/o Chol/HDL Ratio   TSH   Iron  (Fe) deficiency anemia   Under good control on current regimen. Continue current regimen. Continue to monitor. Call with any concerns. Refills given. Labs drawn today.       Relevant Orders   Comprehensive metabolic panel with GFR   CBC with Differential/Platelet   TSH   Ferritin    Iron   Binding Cap (TIBC)([facility])   Benign prostatic hyperplasia with nocturia   Rechecking labs today. Await results. Treat as needed.       Relevant Orders   Comprehensive metabolic panel with GFR   PSA   TSH   Other Visit Diagnoses       Welcome to Medicare preventive visit    -  Primary   Vaccines up to date/declined. Screening labs checked today. Colonoscopy due. Continue diet and exercise. Call with any concerns.   Relevant Orders   EKG 12-Lead     Abnormal EKG       Suggestive of LVH. Will keep BP under good control but get him into cardiology for ECHO. Call with any concerns. Referral generated today.   Relevant Orders   Ambulatory referral to Cardiology     Hepatitis B vaccination status unknown       Labs drawn today. Await results.   Relevant Orders   Hepatitis B surface antibody,quantitative     Screening for cardiovascular condition       EKG done today shows LVH- will keep BP under good control and get him into see cardiology.   Relevant Orders   EKG 12-Lead     Screening for skin cancer       Referral to dermatology placed today.   Relevant Orders   Ambulatory referral to Dermatology        Preventative Services:  AAA Screening: N/A Health Risk Assessment and Personalized Prevention Plan: Done today Bone Mass Measurements: N/A CVD Screening: Done today Colon Cancer Screening: Referral in- contact GI Depression Screening: Done today Diabetes Screening: Done today Glaucoma Screening: see your eye doctor Hepatitis B vaccine: will check immunity today Hepatitis C screening: up to date HIV Screening:up to date Flu Vaccine: get in the fall Lung cancer Screening: N/A Obesity Screening: done today Pneumonia Vaccines (2): up to date STI Screening: N/A PSA screening: done today  Discussed aspirin prophylaxis for myocardial infarction prevention and decision was made to continue ASA  LABORATORY TESTING:  Health maintenance labs ordered today as  discussed above.   The natural history of prostate cancer and ongoing controversy regarding screening and potential treatment outcomes of prostate cancer has been discussed with the patient. The meaning of a false positive PSA and a false negative PSA has been discussed. He indicates understanding of the limitations of this screening test and wishes to proceed with screening PSA testing.   IMMUNIZATIONS:   - Tdap: Tetanus vaccination status reviewed: last tetanus booster within 10 years. - Influenza: Postponed to flu season - Prevnar: Up to date - Zostavax vaccine: Given elsewhere  SCREENING: - Colonoscopy: Referral in- call GI  Discussed with patient purpose of the colonoscopy is to detect colon cancer at curable precancerous or early stages   PATIENT COUNSELING:    Sexuality: Discussed sexually transmitted diseases, partner selection, use of condoms, avoidance of unintended pregnancy  and contraceptive alternatives.   Advised to avoid cigarette smoking.  I discussed with the patient that most people either abstain from alcohol or drink within safe limits (<=14/week and <=4 drinks/occasion for males, <=7/weeks and <= 3 drinks/occasion for females) and that the risk for alcohol disorders and other health effects rises proportionally with the number of drinks per week and how often a drinker exceeds daily limits.  Discussed cessation/primary prevention of drug use and availability of treatment for abuse.   Diet: Encouraged to adjust caloric intake to maintain  or achieve ideal body weight, to reduce intake of  dietary saturated fat and total fat, to limit sodium intake by avoiding high sodium foods and not adding table salt, and to maintain adequate dietary potassium and calcium  preferably from fresh fruits, vegetables, and low-fat dairy products.    stressed the importance of regular exercise  Injury prevention: Discussed safety belts, safety helmets, smoke detector, smoking near bedding  or upholstery.   Dental health: Discussed importance of regular tooth brushing, flossing, and dental visits.   Follow up plan: NEXT PREVENTATIVE PHYSICAL DUE IN 1 YEAR. Return in about 3 months (around 08/07/2024).

## 2024-05-07 NOTE — Assessment & Plan Note (Signed)
 Under good control on current regimen. Continue current regimen. Continue to monitor. Call with any concerns. Refills given. Labs drawn today.

## 2024-05-07 NOTE — Assessment & Plan Note (Signed)
Better on recheck. Continue current regimen. Continue to monitor. Call with any concerns.  

## 2024-05-08 ENCOUNTER — Ambulatory Visit: Payer: Self-pay | Admitting: Family Medicine

## 2024-05-08 LAB — CBC WITH DIFFERENTIAL/PLATELET
Basophils Absolute: 0.1 x10E3/uL (ref 0.0–0.2)
Basos: 2 %
EOS (ABSOLUTE): 0.3 x10E3/uL (ref 0.0–0.4)
Eos: 7 %
Hematocrit: 32.6 % — ABNORMAL LOW (ref 37.5–51.0)
Hemoglobin: 10.1 g/dL — ABNORMAL LOW (ref 13.0–17.7)
Immature Grans (Abs): 0 x10E3/uL (ref 0.0–0.1)
Immature Granulocytes: 0 %
Lymphocytes Absolute: 1.1 x10E3/uL (ref 0.7–3.1)
Lymphs: 27 %
MCH: 29.9 pg (ref 26.6–33.0)
MCHC: 31 g/dL — ABNORMAL LOW (ref 31.5–35.7)
MCV: 96 fL (ref 79–97)
Monocytes Absolute: 0.4 x10E3/uL (ref 0.1–0.9)
Monocytes: 11 %
Neutrophils Absolute: 2 x10E3/uL (ref 1.4–7.0)
Neutrophils: 52 %
Platelets: 316 x10E3/uL (ref 150–450)
RBC: 3.38 x10E6/uL — ABNORMAL LOW (ref 4.14–5.80)
RDW: 15.6 % — ABNORMAL HIGH (ref 11.6–15.4)
WBC: 3.9 x10E3/uL (ref 3.4–10.8)

## 2024-05-08 LAB — IRON AND TIBC
Iron Saturation: 50 % (ref 15–55)
Iron: 194 ug/dL — ABNORMAL HIGH (ref 38–169)
Total Iron Binding Capacity: 385 ug/dL (ref 250–450)
UIBC: 191 ug/dL (ref 111–343)

## 2024-05-08 LAB — COMPREHENSIVE METABOLIC PANEL WITH GFR
ALT: 19 IU/L (ref 0–44)
AST: 17 IU/L (ref 0–40)
Albumin: 4.8 g/dL (ref 3.9–4.9)
Alkaline Phosphatase: 62 IU/L (ref 44–121)
BUN/Creatinine Ratio: 34 — ABNORMAL HIGH (ref 10–24)
BUN: 31 mg/dL — ABNORMAL HIGH (ref 8–27)
Bilirubin Total: 0.5 mg/dL (ref 0.0–1.2)
CO2: 19 mmol/L — ABNORMAL LOW (ref 20–29)
Calcium: 10.4 mg/dL — ABNORMAL HIGH (ref 8.6–10.2)
Chloride: 103 mmol/L (ref 96–106)
Creatinine, Ser: 0.9 mg/dL (ref 0.76–1.27)
Globulin, Total: 2 g/dL (ref 1.5–4.5)
Glucose: 114 mg/dL — ABNORMAL HIGH (ref 70–99)
Potassium: 5.1 mmol/L (ref 3.5–5.2)
Sodium: 138 mmol/L (ref 134–144)
Total Protein: 6.8 g/dL (ref 6.0–8.5)
eGFR: 95 mL/min/1.73 (ref >59)

## 2024-05-08 LAB — LIPID PANEL W/O CHOL/HDL RATIO
Cholesterol, Total: 124 mg/dL (ref 100–199)
HDL: 42 mg/dL (ref >39)
LDL Chol Calc (NIH): 68 mg/dL (ref 0–99)
Triglycerides: 69 mg/dL (ref 0–149)
VLDL Cholesterol Cal: 14 mg/dL (ref 5–40)

## 2024-05-08 LAB — TSH: TSH: 1.35 u[IU]/mL (ref 0.450–4.500)

## 2024-05-08 LAB — HEPATITIS B SURFACE ANTIBODY, QUANTITATIVE: Hepatitis B Surf Ab Quant: 20 m[IU]/mL

## 2024-05-08 LAB — FERRITIN: Ferritin: 44 ng/mL (ref 30–400)

## 2024-05-08 LAB — PSA: Prostate Specific Ag, Serum: 0.4 ng/mL (ref 0.0–4.0)

## 2024-05-19 ENCOUNTER — Telehealth: Payer: Self-pay

## 2024-05-19 NOTE — Telephone Encounter (Signed)
 Copied from CRM #[id number]. Topic: Referral - Question >> May 19, 2024  9:40 AM Emylou G wrote: Reason for CRM: Patient called.. checking status of referral - concerned about his chest? Pls call patient back on status.. Would prefer to here from the doctors nurse

## 2024-05-19 NOTE — Telephone Encounter (Signed)
 Sending to providers CMA for assistance

## 2024-05-20 ENCOUNTER — Inpatient Hospital Stay

## 2024-05-20 ENCOUNTER — Inpatient Hospital Stay: Attending: Oncology | Admitting: Oncology

## 2024-05-20 ENCOUNTER — Encounter: Payer: Self-pay | Admitting: Oncology

## 2024-05-20 VITALS — BP 117/81 | HR 81 | Temp 98.7°F | Resp 18 | Ht 69.0 in | Wt 167.0 lb

## 2024-05-20 DIAGNOSIS — D649 Anemia, unspecified: Secondary | ICD-10-CM

## 2024-05-20 DIAGNOSIS — D509 Iron deficiency anemia, unspecified: Secondary | ICD-10-CM | POA: Insufficient documentation

## 2024-05-20 DIAGNOSIS — Q2730 Arteriovenous malformation, site unspecified: Secondary | ICD-10-CM | POA: Insufficient documentation

## 2024-05-20 LAB — CBC WITH DIFFERENTIAL/PLATELET
Abs Immature Granulocytes: 0.03 K/uL (ref 0.00–0.07)
Basophils Absolute: 0.1 K/uL (ref 0.0–0.1)
Basophils Relative: 1 %
Eosinophils Absolute: 0.2 K/uL (ref 0.0–0.5)
Eosinophils Relative: 3 %
HCT: 32.2 % — ABNORMAL LOW (ref 39.0–52.0)
Hemoglobin: 10.5 g/dL — ABNORMAL LOW (ref 13.0–17.0)
Immature Granulocytes: 1 %
Lymphocytes Relative: 20 %
Lymphs Abs: 0.9 K/uL (ref 0.7–4.0)
MCH: 30.3 pg (ref 26.0–34.0)
MCHC: 32.6 g/dL (ref 30.0–36.0)
MCV: 92.8 fL (ref 80.0–100.0)
Monocytes Absolute: 0.4 K/uL (ref 0.1–1.0)
Monocytes Relative: 9 %
Neutro Abs: 2.9 K/uL (ref 1.7–7.7)
Neutrophils Relative %: 66 %
Platelets: 247 K/uL (ref 150–400)
RBC: 3.47 MIL/uL — ABNORMAL LOW (ref 4.22–5.81)
RDW: 15.3 % (ref 11.5–15.5)
WBC: 4.4 K/uL (ref 4.0–10.5)
nRBC: 0 % (ref 0.0–0.2)

## 2024-05-20 LAB — FERRITIN: Ferritin: 13 ng/mL — ABNORMAL LOW (ref 24–336)

## 2024-05-20 LAB — RETIC PANEL
Immature Retic Fract: 16.2 % — ABNORMAL HIGH (ref 2.3–15.9)
RBC.: 3.42 MIL/uL — ABNORMAL LOW (ref 4.22–5.81)
Retic Count, Absolute: 95.1 K/uL (ref 19.0–186.0)
Retic Ct Pct: 2.8 % (ref 0.4–3.1)
Reticulocyte Hemoglobin: 34.9 pg (ref >27.9)

## 2024-05-20 LAB — IRON AND TIBC
Iron: 123 ug/dL (ref 45–182)
Saturation Ratios: 30 % (ref 17.9–39.5)
TIBC: 414 ug/dL (ref 250–450)
UIBC: 291 ug/dL

## 2024-05-20 LAB — LACTATE DEHYDROGENASE: LDH: 115 U/L (ref 98–192)

## 2024-05-20 LAB — FOLATE: Folate: 13.2 ng/mL (ref >5.9)

## 2024-05-20 LAB — VITAMIN B12: Vitamin B-12: 439 pg/mL (ref 180–914)

## 2024-05-20 NOTE — Assessment & Plan Note (Addendum)
 Recent blood work results were reviewed and discussed with patient.  Previous iron  panel is not typical for iron  deficiency.  I will check CBC, CMP, iron , TIBC ferritin, folate B12, multiple myeloma panel, light chain ratio, LDH, haptoglobin, reticulocyte panel

## 2024-05-20 NOTE — Assessment & Plan Note (Signed)
 Intermittently patient has hypercalcemia.  Check PTH and calcium  level.

## 2024-05-20 NOTE — Progress Notes (Signed)
 Hematology/Oncology Consult note [phone] [phone]        REFERRING PROVIDER: Vicci Duwaine SQUIBB, DO   CHIEF COMPLAINTS/REASON FOR VISIT:  Evaluation of iron  deficiency anemia   ASSESSMENT & PLAN:   Anemia Recent blood work results were reviewed and discussed with patient.  Previous iron  panel is not typical for iron  deficiency.  I will check CBC, CMP, iron , TIBC ferritin, folate B12, multiple myeloma panel, light chain ratio, LDH, haptoglobin, reticulocyte panel  Hypercalcemia Intermittently patient has hypercalcemia.  Check PTH and calcium  level.   Orders Placed This Encounter  Procedures   Iron  and TIBC    Standing Status:   Future    Number of Occurrences:   1    Expected Date:   05/20/2024    Expiration Date:   05/20/2025   Ferritin    Standing Status:   Future    Number of Occurrences:   1    Expected Date:   05/20/2024    Expiration Date:   11/20/2024   Vitamin B12    Standing Status:   Future    Number of Occurrences:   1    Expected Date:   05/20/2024    Expiration Date:   05/20/2025   Folate    Standing Status:   Future    Number of Occurrences:   1    Expected Date:   05/20/2024    Expiration Date:   05/20/2025   CBC with Differential/Platelet    Standing Status:   Future    Number of Occurrences:   1    Expected Date:   05/20/2024    Expiration Date:   05/20/2025   Retic Panel    Standing Status:   Future    Number of Occurrences:   1    Expected Date:   05/20/2024    Expiration Date:   05/20/2025   Multiple Myeloma Panel (SPEP&IFE w/QIG)    Standing Status:   Future    Number of Occurrences:   1    Expected Date:   05/20/2024    Expiration Date:   05/20/2025   Kappa/lambda light chains    Standing Status:   Future    Number of Occurrences:   1    Expected Date:   05/20/2024    Expiration Date:   05/20/2025   Lactate dehydrogenase    Standing Status:   Future    Number of Occurrences:   1    Expected Date:   05/20/2024    Expiration Date:    05/20/2025   Haptoglobin    Standing Status:   Future    Number of Occurrences:   1    Expected Date:   05/20/2024    Expiration Date:   05/20/2025   Parathyroid hormone,  intact and calcium     Standing Status:   Future    Number of Occurrences:   1    Expected Date:   05/20/2024    Expiration Date:   05/20/2025    All questions were answered. The patient knows to call the clinic with any problems, questions or concerns.  Stanley Cap, MD, PhD [facility] Hematology Oncology 05/20/2024   HISTORY OF PRESENTING ILLNESS:   Stanley Dickerson is a  63 y.o.  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 Patient was referred back for evaluation of anemia. 05/07/2024,CBC showed hemoglobin 10.1, hematocrit 32.6, ferritin 44, TIBC 385, iron  saturation 50.  Patient reports that he has been taking oral iron  supplementation.  Patient reports feeling fatigued.  Occasionally he has diarrhea which he attributes to taking oral iron  supplementation.  He also feels shortness of breath with exertion.  He is going to have cardiology workup.  Patient denies any chest pain, unintentional weight loss night sweats.  MEDICAL HISTORY:  Past Medical History:  Diagnosis Date   Diabetes mellitus without complication (HCC)    Hyperlipidemia    Hypertension     SURGICAL HISTORY: Past Surgical History:  Procedure Laterality Date   CATARACT EXTRACTION     COLONOSCOPY WITH PROPOFOL  N/A 08/10/2016   Procedure: COLONOSCOPY WITH PROPOFOL ;  Surgeon: Stanley Kung, MD;  Location: [facility] ENDOSCOPY;  Service: Endoscopy;  Laterality: N/A;   ESOPHAGOGASTRODUODENOSCOPY (EGD) WITH PROPOFOL  N/A 08/10/2016   Procedure: ESOPHAGOGASTRODUODENOSCOPY (EGD) WITH PROPOFOL ;  Surgeon: Stanley Kung, MD;  Location: [facility] ENDOSCOPY;  Service:  Endoscopy;  Laterality: N/A;   GIVENS CAPSULE STUDY N/A 07/03/2018   Procedure: GIVENS CAPSULE STUDY;  Surgeon: Dickerson Ruel, MD;  Location: [facility] ENDOSCOPY;  Service: Gastroenterology;  Laterality: N/A;   HERNIA REPAIR     TONSILLECTOMY AND ADENOIDECTOMY      SOCIAL HISTORY: Social History   Socioeconomic History   Marital status: Single    Spouse name: Not on file   Number of children: Not on file   Years of education: Not on file   Highest education level: Not on file  Occupational History   Occupation: Network engineer of [employer]   Tobacco Use   Smoking status: Never   Smokeless tobacco: Never  Vaping Use   Vaping status: Never Used  Substance and Sexual Activity   Alcohol use: Not Currently    Comment: rare   Drug use: No   Sexual activity: Not Currently  Other Topics Concern   Not on file  Social History Narrative   Not on file   Social Drivers of Health   Financial Resource Strain: Low Risk  (04/28/2024)   Overall Financial Resource Strain (CARDIA)    Difficulty of Paying Living Expenses: Not hard at all  Food Insecurity: No Food Insecurity (05/20/2024)   Hunger Vital Sign    Worried About Running Out of Food in the Last Year: Never true    Ran Out of Food in the Last Year: Never true  Transportation Needs: No Transportation Needs (05/20/2024)   PRAPARE - Administrator, Civil Service (Medical): No    Lack of Transportation (Non-Medical): No  Physical Activity: Sufficiently Active (04/28/2024)   Exercise Vital Sign    Days of Exercise per Week: 7 days    Minutes of Exercise per Session: 150+ min  Stress: No Stress Concern Present (04/28/2024)   [employer] of Occupational Health - Occupational Stress Questionnaire    Feeling of Stress: Not at all  Social Connections: Socially Isolated (04/28/2024)   Social Connection and Isolation Panel    Frequency of Communication with Friends and Family: More than three times a week    Frequency of Social  Gatherings with Friends and Family: Twice a week    Attends Religious Services: Never    Database administrator or Organizations: No    Attends Banker Meetings: Never    Marital Status: Divorced  Catering manager Violence: Not At  Risk (05/20/2024)   Humiliation, Afraid, Rape, and Kick questionnaire    Fear of Current or Ex-Partner: No    Emotionally Abused: No    Physically Abused: No    Sexually Abused: No    FAMILY HISTORY: Family History  Problem Relation Age of Onset   Depression Mother    Thyroid disease Mother    Alzheimer's disease Mother    Alcohol abuse Father    Lung cancer Father    Throat cancer Father    Alzheimer's disease Maternal Grandmother    Diabetes Sister    Hypertension Brother     ALLERGIES:  has no known allergies.  MEDICATIONS:  Current Outpatient Medications  Medication Sig Dispense Refill   albuterol  (VENTOLIN  HFA) 108 (90 Base) MCG/ACT inhaler Inhale 2 puffs into the lungs every 6 (six) hours as needed for wheezing or shortness of breath. 6.7 each 0   aspirin EC 81 MG tablet Take 81 mg by mouth daily.     atorvastatin  (LIPITOR) 40 MG tablet Take 1 tablet (40 mg total) by mouth every other day. 45 tablet 1   benazepril  (LOTENSIN ) 20 MG tablet Take 1.5 tablets (30 mg total) by mouth daily. 135 tablet 1   budesonide -formoterol  (SYMBICORT ) 80-4.5 MCG/ACT inhaler Inhale 2 puffs into the lungs 2 (two) times daily. 1 each 3   celecoxib  (CELEBREX ) 100 MG capsule Take 1 capsule (100 mg total) by mouth 2 (two) times daily. 180 capsule 1   escitalopram  (LEXAPRO ) 5 MG tablet Take 1 tablet (5 mg total) by mouth daily. 90 tablet 1   ferrous sulfate  325 (65 FE) MG tablet Take 1 tablet (325 mg total) by mouth 2 (two) times daily with a meal. 180 tablet 4   glyBURIDE  (DIABETA ) 5 MG tablet Take 1 tablet (5 mg total) by mouth daily with breakfast. 90 tablet 1   metFORMIN  (GLUCOPHAGE ) 500 MG tablet Take 2 tablets (1,000 mg total) by mouth 2 (two) times  daily with a meal. 360 tablet 1   No current facility-administered medications for this visit.    Review of Systems  Constitutional:  Positive for fatigue. Negative for appetite change, chills and fever.  HENT:   Negative for hearing loss and voice change.   Eyes:  Negative for eye problems.  Respiratory:  Positive for shortness of breath. Negative for chest tightness and cough.   Cardiovascular:  Negative for chest pain.  Gastrointestinal:  Negative for abdominal distention, abdominal pain and blood in stool.  Endocrine: Negative for hot flashes.  Genitourinary:  Negative for difficulty urinating and frequency.   Musculoskeletal:  Negative for arthralgias.  Skin:  Negative for itching and rash.  Neurological:  Negative for extremity weakness.  Hematological:  Negative for adenopathy.  Psychiatric/Behavioral:  Negative for confusion.    PHYSICAL EXAMINATION:  Vitals:   05/20/24 0933  BP: 117/81  Pulse: 81  Resp: 18  Temp: 98.7 F (37.1 C)  SpO2: 100%   Filed Weights   05/20/24 0933  Weight: 167 lb (75.8 kg)    Physical Exam Constitutional:      General: He is not in acute distress. HENT:     Head: Normocephalic and atraumatic.  Eyes:     General: No scleral icterus. Cardiovascular:     Rate and Rhythm: Normal rate and regular rhythm.     Heart sounds: Normal heart sounds.  Pulmonary:     Effort: Pulmonary effort is normal. No respiratory distress.     Breath sounds: No wheezing.  Abdominal:  General: Bowel sounds are normal. There is no distension.     Palpations: Abdomen is soft.  Musculoskeletal:        General: No deformity. Normal range of motion.     Cervical back: Normal range of motion and neck supple.  Skin:    General: Skin is warm and dry.     Findings: No erythema or rash.  Neurological:     Mental Status: He is alert and oriented to person, place, and time. Mental status is at baseline.  Psychiatric:        Mood and Affect: Mood normal.      LABORATORY DATA:  I have reviewed the data as listed    Latest Ref Rng & Units 05/20/2024    9:57 AM 05/07/2024    9:20 AM 02/06/2024    1:49 PM  CBC  WBC 4.0 - 10.5 K/uL 4.4  3.9  4.5   Hemoglobin 13.0 - 17.0 g/dL 89.4  89.8  88.8   Hematocrit 39.0 - 52.0 % 32.2  32.6  33.7   Platelets 150 - 400 K/uL 247  316  231       Latest Ref Rng & Units 05/07/2024    9:20 AM 02/06/2024    1:49 PM 10/30/2023    9:26 AM  CMP  Glucose 70 - 99 mg/dL 885  878  833   BUN 8 - 27 mg/dL 31  19  17    Creatinine 0.76 - 1.27 mg/dL 9.09  9.16  9.25   Sodium 134 - 144 mmol/L 138  138  136   Potassium 3.5 - 5.2 mmol/L 5.1  4.3  4.8   Chloride 96 - 106 mmol/L 103  103  100   CO2 20 - 29 mmol/L 19  23  24    Calcium  8.6 - 10.2 mg/dL 89.5  9.8  9.8   Total Protein 6.0 - 8.5 g/dL 6.8  6.4    Total Bilirubin 0.0 - 1.2 mg/dL 0.5  0.3    Alkaline Phos 44 - 121 IU/L 62  54    AST 0 - 40 IU/L 17  15    ALT 0 - 44 IU/L 19  18        RADIOGRAPHIC STUDIES: I have personally reviewed the radiological images as listed and agreed with the findings in the report. No results found.

## 2024-05-20 NOTE — Progress Notes (Signed)
 Patient has had the iron  infusions in the past and he has tolerated them well. He is having fatigue and diarrhea.

## 2024-05-21 LAB — PTH, INTACT AND CALCIUM
Calcium, Total (PTH): 9.9 mg/dL (ref 8.6–10.2)
PTH: 33 pg/mL (ref 15–65)

## 2024-05-21 LAB — KAPPA/LAMBDA LIGHT CHAINS
Kappa free light chain: 20.5 mg/L — ABNORMAL HIGH (ref 3.3–19.4)
Kappa, lambda light chain ratio: 1.4 (ref 0.26–1.65)
Lambda free light chains: 14.6 mg/L (ref 5.7–26.3)

## 2024-05-21 LAB — HAPTOGLOBIN: Haptoglobin: 85 mg/dL (ref 32–363)

## 2024-05-22 LAB — MULTIPLE MYELOMA PANEL, SERUM
Albumin SerPl Elph-Mcnc: 4.3 g/dL (ref 2.9–4.4)
Albumin/Glob SerPl: 1.8 — ABNORMAL HIGH (ref 0.7–1.7)
Alpha 1: 0.2 g/dL (ref 0.0–0.4)
Alpha2 Glob SerPl Elph-Mcnc: 0.6 g/dL (ref 0.4–1.0)
B-Globulin SerPl Elph-Mcnc: 0.8 g/dL (ref 0.7–1.3)
Gamma Glob SerPl Elph-Mcnc: 0.8 g/dL (ref 0.4–1.8)
Globulin, Total: 2.4 g/dL (ref 2.2–3.9)
IgA: 98 mg/dL (ref 61–437)
IgG (Immunoglobin G), Serum: 864 mg/dL (ref 603–1613)
IgM (Immunoglobulin M), Srm: 80 mg/dL (ref 20–172)
Total Protein ELP: 6.7 g/dL (ref 6.0–8.5)

## 2024-05-24 ENCOUNTER — Ambulatory Visit: Payer: Self-pay | Admitting: Oncology

## 2024-05-24 DIAGNOSIS — D649 Anemia, unspecified: Secondary | ICD-10-CM

## 2024-05-25 ENCOUNTER — Encounter: Payer: Self-pay | Admitting: Oncology

## 2024-05-25 NOTE — Telephone Encounter (Signed)
-----   Message from Zelphia Cap sent at 05/24/2024  1:28 PM EDT ----- Please let patient know this blood work this time showed iron  deficiency anemia.  I recommend Venofer  weekly x 3 please adjust his follow up appointment to be 8-10 weeks  Lab prior to MD +/- Venofer  please order cbc iron  tibc ferritin retic panel.   ----- Message ----- From: Interface, Lab In [facility] Sent: 05/20/2024  10:12 AM EDT To: Zelphia Cap, MD

## 2024-05-25 NOTE — Telephone Encounter (Signed)
 Stanley Dickerson spoke to pt and informed him of follow up plan. Pt states that he will look at his schedule and then call back to schedule appts per his schedule:   -IV venofer  weekly x 3 -adjust follow up to be in 8-10 weeks:  labs prior to MD/ Venofer . Cancel MD appt in Aug

## 2024-05-26 NOTE — Progress Notes (Signed)
  Cardiology Office Note   Date:  05/27/2024  ID:  Ronith Berti, DOB July 30, 1961, MRN [id number] PCP: Vicci Duwaine SQUIBB, DO   HeartCare Providers Cardiologist:  Caron Poser, MD     History of Present Illness Derrick Tiegs is a 63 y.o. male PMH HTN, HLD, and DM2 who presents for further evaluation and management of abnormal ECG.  Patient reports he was referred from his PCP after discovery of an abnormal ECG.  He denies any current symptoms.  He reported a single episode of right sided, sharp chest pain at rest that resolved after a few seconds after hearing that his ECG was abnormal.  He denies any exertional symptoms.  He denies any dyspnea, syncope, presyncope, palpitations, or any other concerning features.  Relevant CVD History -Normal ABIs 2019 -LVH on ECG dating back to 2016 (at least)   ROS: Pt denies any chest discomfort, jaw pain, arm pain, palpitations, syncope, presyncope, orthopnea, PND, or LE edema.  Studies Reviewed EKG Interpretation Date/Time:  Wednesday May 27 2024 08:23:58 EDT Ventricular Rate:  55 PR Interval:  162 QRS Duration:  94 QT Interval:  406 QTC Calculation: 388 R Axis:   21  Text Interpretation: Sinus bradycardia with Premature supraventricular complexes Left ventricular hypertrophy Early repolarization When compared with ECG of 13-May-2018 13:55, Premature supraventricular complexes are now Present Confirmed by Poser Caron [phone]) on 05/27/2024 8:26:30 AM    I have independently reviewed the patient's ECG and recent bloodwork.  Risk Assessment/Calculations          Physical Exam VS:  BP (!) 168/72 (BP Location: Right Arm, Cuff Size: Normal)   Pulse (!) 55   Ht 5' 7 (1.702 m)   Wt 166 lb 9.6 oz (75.6 kg)   SpO2 97%   BMI 26.09 kg/m        Wt Readings from Last 3 Encounters:  05/27/24 166 lb 9.6 oz (75.6 kg)  05/20/24 167 lb (75.8 kg)  05/07/24 165 lb 3.2 oz (74.9 kg)    GEN: No acute distress. NECK: No JVD; No  carotid bruits. CARDIAC: RRR, no murmurs, rubs, gallops. RESPIRATORY:  Clear to auscultation. EXTREMITIES:  Warm and well-perfused. No edema.  ASSESSMENT AND PLAN Abnormal ECG Chest discomfort ECG suggestive of LVH which was apparent back in 2016 within our system. He also complains of a single, isolated episode of right sided sharp chest pain that occurred at rest.  He feels as though it is musculoskeletal in origin. He has not had any further episodes since and has good functional status.  Modifiable risk factors are currently well-controlled.  PLAN: - Echocardiogram to further evaluate ECG findings - His chest symptoms are most consistent with a noncardiac etiology; I advised him to call me if he notices any change or frequency in the symptoms, especially if an exertional component arises.  He would be a good candidate for a CCTA if that is the case.  HTN Well-controlled historically, a bit high in clinic today.  I advised him to check them at home and to call me if he notices they are consistently greater than 130/80.  Continue benazepril  30 mg daily for now.  HLD Well-controlled, LDL 68 on 05/07/2024.  LDL goal less than 70.  Continue Lipitor 40 mg every other day, has concurrent DM2.        Dispo: RTC 1 year or earlier as needed  Signed, Caron Poser, MD

## 2024-05-27 ENCOUNTER — Ambulatory Visit

## 2024-05-27 VITALS — BP 168/72 | HR 55 | Ht 67.0 in | Wt 166.6 lb

## 2024-05-27 DIAGNOSIS — E7800 Pure hypercholesterolemia, unspecified: Secondary | ICD-10-CM

## 2024-05-27 DIAGNOSIS — I517 Cardiomegaly: Secondary | ICD-10-CM

## 2024-05-27 DIAGNOSIS — R079 Chest pain, unspecified: Secondary | ICD-10-CM | POA: Diagnosis not present

## 2024-05-27 DIAGNOSIS — I10 Essential (primary) hypertension: Secondary | ICD-10-CM | POA: Diagnosis not present

## 2024-05-27 NOTE — Patient Instructions (Signed)
 Medication Instructions:  The current medical regimen is effective;  continue present plan and medications as directed. Please refer to the Current Medication list given to you today.   *If you need a refill on your cardiac medications before your next appointment, please call your pharmacy*  Testing/Procedures:  Your physician has requested that you have an echocardiogram. Echocardiography is a painless test that uses sound waves to create images of your heart. It provides your doctor with information about the size and shape of your heart and how well your heart's chambers and valves are working.   You may receive an ultrasound enhancing agent through an IV if needed to better visualize your heart during the echo. This procedure takes approximately one hour.  There are no restrictions for this procedure.  This will take place at [address] [facility] [address] [facility]) [address], [place] [address]  Please note: We ask at that you not bring children with you during ultrasound (echo/ vascular) testing. Due to room size and safety concerns, children are not allowed in the ultrasound rooms during exams. Our front office staff cannot provide observation of children in our lobby area while testing is being conducted. An adult accompanying a patient to their appointment will only be allowed in the ultrasound room at the discretion of the ultrasound technician under special circumstances. We apologize for any inconvenience.   Follow-Up: At [facility], you and your health needs are our priority.  As part of our continuing mission to provide you with exceptional heart care, our providers are all part of one team.  This team includes your primary Cardiologist (physician) and Advanced Practice Providers or APPs (Physician Assistants and Nurse Practitioners) who all work together to provide you with the care you need, when you need it.  Your next appointment:   12 month(s)  Provider:   Caron Poser, MD    We recommend signing up for the patient portal called MyChart.  Sign up information is provided on this After Visit Summary.  MyChart is used to connect with patients for Virtual Visits (Telemedicine).  Patients are able to view lab/test results, encounter notes, upcoming appointments, etc.  Non-urgent messages can be sent to your provider as well.   To learn more about what you can do with MyChart, go to [email or web address].

## 2024-06-04 ENCOUNTER — Ambulatory Visit: Admitting: Oncology

## 2024-06-09 ENCOUNTER — Ambulatory Visit: Admitting: Oncology

## 2024-06-09 DIAGNOSIS — K080 Exfoliation of teeth due to systemic causes: Secondary | ICD-10-CM | POA: Diagnosis not present

## 2024-06-10 ENCOUNTER — Inpatient Hospital Stay

## 2024-06-10 VITALS — BP 133/73 | HR 55 | Temp 96.4°F | Resp 18

## 2024-06-10 DIAGNOSIS — D509 Iron deficiency anemia, unspecified: Secondary | ICD-10-CM | POA: Diagnosis not present

## 2024-06-10 DIAGNOSIS — D508 Other iron deficiency anemias: Secondary | ICD-10-CM

## 2024-06-10 DIAGNOSIS — Q2730 Arteriovenous malformation, site unspecified: Secondary | ICD-10-CM | POA: Diagnosis not present

## 2024-06-10 MED ORDER — IRON SUCROSE 20 MG/ML IV SOLN
200.0000 mg | Freq: Once | INTRAVENOUS | Status: AC
  Administered 2024-06-10: 200 mg via INTRAVENOUS
  Filled 2024-06-10: qty 10

## 2024-06-10 MED ORDER — SODIUM CHLORIDE 0.9% FLUSH
10.0000 mL | INTRAVENOUS | Status: DC | PRN
  Administered 2024-06-10: 10 mL
  Filled 2024-06-10: qty 10

## 2024-06-10 MED ORDER — SODIUM CHLORIDE 0.9 % IV SOLN: 200.0000 mg | INTRAVENOUS | Status: DC

## 2024-06-18 ENCOUNTER — Ambulatory Visit

## 2024-06-18 ENCOUNTER — Inpatient Hospital Stay: Attending: Oncology

## 2024-06-18 VITALS — BP 135/88 | HR 70 | Temp 95.9°F | Resp 18

## 2024-06-18 DIAGNOSIS — E7800 Pure hypercholesterolemia, unspecified: Secondary | ICD-10-CM | POA: Diagnosis not present

## 2024-06-18 DIAGNOSIS — Q2730 Arteriovenous malformation, site unspecified: Secondary | ICD-10-CM | POA: Insufficient documentation

## 2024-06-18 DIAGNOSIS — R079 Chest pain, unspecified: Secondary | ICD-10-CM | POA: Diagnosis not present

## 2024-06-18 DIAGNOSIS — D508 Other iron deficiency anemias: Secondary | ICD-10-CM

## 2024-06-18 DIAGNOSIS — I517 Cardiomegaly: Secondary | ICD-10-CM | POA: Diagnosis not present

## 2024-06-18 DIAGNOSIS — D509 Iron deficiency anemia, unspecified: Secondary | ICD-10-CM | POA: Diagnosis not present

## 2024-06-18 DIAGNOSIS — I10 Essential (primary) hypertension: Secondary | ICD-10-CM | POA: Diagnosis not present

## 2024-06-18 MED ORDER — IRON SUCROSE 20 MG/ML IV SOLN
200.0000 mg | Freq: Once | INTRAVENOUS | Status: AC
  Administered 2024-06-18: 200 mg via INTRAVENOUS
  Filled 2024-06-18: qty 10

## 2024-06-18 NOTE — Patient Instructions (Signed)

## 2024-06-19 ENCOUNTER — Ambulatory Visit: Payer: Self-pay

## 2024-06-19 LAB — ECHOCARDIOGRAM COMPLETE
AR max vel: 4.36 cm2
AV Area VTI: 3.97 cm2
AV Area mean vel: 3.94 cm2
AV Mean grad: 2 mmHg
AV Peak grad: 4.4 mmHg
Ao pk vel: 1.05 m/s
Area-P 1/2: 2.77 cm2
Calc EF: 59.8 %
MV M vel: 6.3 m/s
MV Peak grad: 158.8 mmHg
MV VTI: 2.47 cm2
Radius: 0.6 cm
S' Lateral: 3.8 cm
Single Plane A2C EF: 61.6 %
Single Plane A4C EF: 60.3 %

## 2024-06-24 ENCOUNTER — Inpatient Hospital Stay

## 2024-06-24 VITALS — BP 98/66 | HR 76 | Temp 96.9°F | Resp 18

## 2024-06-24 DIAGNOSIS — D508 Other iron deficiency anemias: Secondary | ICD-10-CM

## 2024-06-24 DIAGNOSIS — D509 Iron deficiency anemia, unspecified: Secondary | ICD-10-CM | POA: Diagnosis not present

## 2024-06-24 DIAGNOSIS — Q2730 Arteriovenous malformation, site unspecified: Secondary | ICD-10-CM | POA: Diagnosis not present

## 2024-06-24 MED ORDER — IRON SUCROSE 20 MG/ML IV SOLN
200.0000 mg | Freq: Once | INTRAVENOUS | Status: AC
  Administered 2024-06-24: 200 mg via INTRAVENOUS
  Filled 2024-06-24: qty 10

## 2024-06-24 NOTE — Patient Instructions (Signed)

## 2024-08-10 ENCOUNTER — Encounter: Payer: Self-pay | Admitting: Family Medicine

## 2024-08-10 ENCOUNTER — Ambulatory Visit (INDEPENDENT_AMBULATORY_CARE_PROVIDER_SITE_OTHER): Admitting: Family Medicine

## 2024-08-10 VITALS — BP 116/68 | HR 61 | Temp 98.4°F | Ht 67.0 in | Wt 166.8 lb

## 2024-08-10 DIAGNOSIS — I10 Essential (primary) hypertension: Secondary | ICD-10-CM

## 2024-08-10 DIAGNOSIS — Z23 Encounter for immunization: Secondary | ICD-10-CM

## 2024-08-10 DIAGNOSIS — E1151 Type 2 diabetes mellitus with diabetic peripheral angiopathy without gangrene: Secondary | ICD-10-CM

## 2024-08-10 LAB — BAYER DCA HB A1C WAIVED: HB A1C (BAYER DCA - WAIVED): 6.9 % — ABNORMAL HIGH (ref 4.8–5.6)

## 2024-08-10 MED ORDER — CELECOXIB 100 MG PO CAPS: 100.0000 mg | ORAL_CAPSULE | Freq: Two times a day (BID) | ORAL | 1 refills | Status: AC

## 2024-08-10 MED ORDER — ESCITALOPRAM OXALATE 5 MG PO TABS: 5.0000 mg | ORAL_TABLET | Freq: Every day | ORAL | 1 refills | Status: DC

## 2024-08-10 MED ORDER — ATORVASTATIN CALCIUM 40 MG PO TABS: 40.0000 mg | ORAL_TABLET | ORAL | 1 refills | Status: DC

## 2024-08-10 MED ORDER — BENAZEPRIL HCL 20 MG PO TABS: 30.0000 mg | ORAL_TABLET | Freq: Every day | ORAL | 1 refills | Status: AC

## 2024-08-10 MED ORDER — GLYBURIDE 5 MG PO TABS: 5.0000 mg | ORAL_TABLET | Freq: Every day | ORAL | 1 refills | Status: AC

## 2024-08-10 MED ORDER — METFORMIN HCL 500 MG PO TABS: 1000.0000 mg | ORAL_TABLET | Freq: Two times a day (BID) | ORAL | 1 refills | Status: AC

## 2024-08-10 NOTE — Progress Notes (Signed)
 BP 116/68   Pulse 61   Temp 98.4 F (36.9 C) (Oral)   Ht 5' 7 (1.702 m)   Wt 166 lb 12.8 oz (75.7 kg)   SpO2 97%   BMI 26.12 kg/m    Subjective:    Patient ID: Stanley Dickerson, male    DOB: [birth year], 63 y.o.   MRN: [id number]  HPI: Stanley Dickerson is a 63 y.o. male  Chief Complaint  Patient presents with   Diabetes   Hypertension   DIABETES Hypoglycemic episodes:no Polydipsia/polyuria: no Visual disturbance: no Chest pain: no Paresthesias: no Glucose Monitoring: no  Accucheck frequency: Not Checking Taking Insulin?: no Blood Pressure Monitoring: rarely Retinal Examination: Up to Date Foot Exam: Up to Date Diabetic Education: Completed Pneumovax: Up to Date Influenza: Up to Date Aspirin: yes   Relevant past medical, surgical, family and social history reviewed and updated as indicated. Interim medical history since our last visit reviewed. Allergies and medications reviewed and updated.  Review of Systems  Constitutional: Negative.   Respiratory: Negative.    Cardiovascular: Negative.   Musculoskeletal: Negative.   Neurological: Negative.   Psychiatric/Behavioral: Negative.      Per HPI unless specifically indicated above     Objective:    BP 116/68   Pulse 61   Temp 98.4 F (36.9 C) (Oral)   Ht 5' 7 (1.702 m)   Wt 166 lb 12.8 oz (75.7 kg)   SpO2 97%   BMI 26.12 kg/m   Wt Readings from Last 3 Encounters:  08/10/24 166 lb 12.8 oz (75.7 kg)  05/27/24 166 lb 9.6 oz (75.6 kg)  05/20/24 167 lb (75.8 kg)    Physical Exam Vitals and nursing note reviewed.  Constitutional:      General: He is not in acute distress.    Appearance: Normal appearance. He is not ill-appearing, toxic-appearing or diaphoretic.  HENT:     Head: Normocephalic and atraumatic.     Right Ear: External ear normal.     Left Ear: External ear normal.     Nose: Nose normal.     Mouth/Throat:     Mouth: Mucous membranes are moist.     Pharynx: Oropharynx is clear.   Eyes:     General: No scleral icterus.       Right eye: No discharge.        Left eye: No discharge.     Extraocular Movements: Extraocular movements intact.     Conjunctiva/sclera: Conjunctivae normal.     Pupils: Pupils are equal, round, and reactive to light.  Cardiovascular:     Rate and Rhythm: Normal rate and regular rhythm.     Pulses: Normal pulses.     Heart sounds: Normal heart sounds. No murmur heard.    No friction rub. No gallop.  Pulmonary:     Effort: Pulmonary effort is normal. No respiratory distress.     Breath sounds: Normal breath sounds. No stridor. No wheezing, rhonchi or rales.  Chest:     Chest wall: No tenderness.  Musculoskeletal:        General: Normal range of motion.     Cervical back: Normal range of motion and neck supple.  Skin:    General: Skin is warm and dry.     Capillary Refill: Capillary refill takes less than 2 seconds.     Coloration: Skin is not jaundiced or pale.     Findings: No bruising, erythema, lesion or rash.  Neurological:     General: No  focal deficit present.     Mental Status: He is alert and oriented to person, place, and time. Mental status is at baseline.  Psychiatric:        Mood and Affect: Mood normal.        Behavior: Behavior normal.        Thought Content: Thought content normal.        Judgment: Judgment normal.     Results for orders placed or performed in visit on 06/18/24  ECHOCARDIOGRAM COMPLETE   Collection Time: 06/18/24 11:00 AM  Result Value Ref Range   BP 135/88 mmHg   AR max vel 4.36 cm2   AV Peak grad 4.4 mmHg   Ao pk vel 1.05 m/s   S' Lateral 3.80 cm   Area-P 1/2 2.77 cm2   AV Area VTI 3.97 cm2   AV Mean grad 2.0 mmHg   Single Plane A4C EF 60.3 %   Single Plane A2C EF 61.6 %   Calc EF 59.8 %   AV Area mean vel 3.94 cm2   MV VTI 2.47 cm2   MV M vel 6.30 m/s   MV Peak grad 158.8 mmHg   Radius 0.60 cm   Est EF 55 - 60%       Assessment & Plan:   Problem List Items Addressed This Visit        Cardiovascular and Mediastinum   Hypertension   Relevant Medications   atorvastatin  (LIPITOR) 40 MG tablet   benazepril  (LOTENSIN ) 20 MG tablet   Diabetes mellitus with peripheral vascular disease (HCC) - Primary   Doing great with A1c of 6.9. Continue current regimen. Continue to monitor. Recheck 3 months.       Relevant Medications   atorvastatin  (LIPITOR) 40 MG tablet   benazepril  (LOTENSIN ) 20 MG tablet   glyBURIDE  (DIABETA ) 5 MG tablet   metFORMIN  (GLUCOPHAGE ) 500 MG tablet   Other Relevant Orders   Bayer DCA Hb A1c Waived   Other Visit Diagnoses       Needs flu shot       Flu shot given today.   Relevant Orders   Flu vaccine HIGH DOSE PF(Fluzone Trivalent) (Completed)        Follow up plan: Return in about 3 months (around 11/10/2024).

## 2024-08-10 NOTE — Assessment & Plan Note (Signed)
 Doing great with A1c of 6.9. Continue current regimen. Continue to monitor. Recheck 3 months.

## 2024-08-12 ENCOUNTER — Inpatient Hospital Stay: Attending: Oncology

## 2024-08-14 ENCOUNTER — Telehealth: Payer: Self-pay | Admitting: Oncology

## 2024-08-14 NOTE — Telephone Encounter (Signed)
 Pt currently scheduled for MD/iron  on 11/6 and no showed labs prior.   I left a vm for pt to call back (scheduling number provided) to either r/s labs prior to MD appt OR to r/s all appts.

## 2024-08-20 ENCOUNTER — Inpatient Hospital Stay

## 2024-08-20 ENCOUNTER — Inpatient Hospital Stay: Attending: Oncology | Admitting: Oncology

## 2024-11-10 ENCOUNTER — Ambulatory Visit (INDEPENDENT_AMBULATORY_CARE_PROVIDER_SITE_OTHER): Admitting: Family Medicine

## 2024-11-16 ENCOUNTER — Ambulatory Visit: Admitting: Family Medicine

## 2024-11-19 ENCOUNTER — Encounter: Payer: Self-pay | Admitting: Family Medicine

## 2024-11-19 ENCOUNTER — Ambulatory Visit: Admitting: Family Medicine

## 2024-11-19 VITALS — BP 133/72 | HR 72 | Temp 97.7°F | Resp 16 | Ht 67.0 in | Wt 169.0 lb

## 2024-11-19 DIAGNOSIS — D500 Iron deficiency anemia secondary to blood loss (chronic): Secondary | ICD-10-CM

## 2024-11-19 DIAGNOSIS — Z1211 Encounter for screening for malignant neoplasm of colon: Secondary | ICD-10-CM

## 2024-11-19 DIAGNOSIS — I10 Essential (primary) hypertension: Secondary | ICD-10-CM

## 2024-11-19 DIAGNOSIS — R519 Headache, unspecified: Secondary | ICD-10-CM

## 2024-11-19 DIAGNOSIS — E1151 Type 2 diabetes mellitus with diabetic peripheral angiopathy without gangrene: Secondary | ICD-10-CM

## 2024-11-19 DIAGNOSIS — F419 Anxiety disorder, unspecified: Secondary | ICD-10-CM

## 2024-11-19 DIAGNOSIS — E7800 Pure hypercholesterolemia, unspecified: Secondary | ICD-10-CM

## 2024-11-19 DIAGNOSIS — M545 Low back pain, unspecified: Secondary | ICD-10-CM

## 2024-11-19 LAB — URINALYSIS, ROUTINE W REFLEX MICROSCOPIC
Bilirubin, UA: NEGATIVE
Glucose, UA: NEGATIVE
Ketones, UA: NEGATIVE
Leukocytes,UA: NEGATIVE
Nitrite, UA: NEGATIVE
Protein,UA: NEGATIVE
RBC, UA: NEGATIVE
Specific Gravity, UA: 1.025 (ref 1.005–1.030)
Urobilinogen, Ur: 0.2 mg/dL (ref 0.2–1.0)
pH, UA: 6 (ref 5.0–7.5)

## 2024-11-19 LAB — MICROALBUMIN, URINE WAIVED
Creatinine, Urine Waived: 100 mg/dL (ref 10–300)
Microalb, Ur Waived: 30 mg/L — ABNORMAL HIGH (ref 0–19)
Microalb/Creat Ratio: <30 mg/g

## 2024-11-19 LAB — BAYER DCA HB A1C WAIVED: HB A1C (BAYER DCA - WAIVED): 6.8 % — ABNORMAL HIGH (ref 4.8–5.6)

## 2024-11-19 MED ORDER — ATORVASTATIN CALCIUM 40 MG PO TABS: 40.0000 mg | ORAL_TABLET | ORAL | 0 refills | Status: AC

## 2024-11-19 MED ORDER — ESCITALOPRAM OXALATE 10 MG PO TABS: 10.0000 mg | ORAL_TABLET | Freq: Every day | ORAL | 0 refills | Status: AC

## 2024-11-19 NOTE — Assessment & Plan Note (Signed)
 Doing well with A1c of 6.8. Continue current regimen. Continue to monitor. Recheck 3 months. Call with any concerns.

## 2024-11-19 NOTE — Assessment & Plan Note (Signed)
 Under good control on current regimen. Continue current regimen. Continue to monitor. Call with any concerns. Refills given. Labs drawn today.

## 2024-11-19 NOTE — Progress Notes (Signed)
 "  BP 133/72 (BP Location: Left Arm, Patient Position: Sitting, Cuff Size: Normal)   Pulse 72   Temp 97.7 F (36.5 C) (Oral)   Resp 16   Ht 5' 7 (1.702 m)   Wt 169 lb (76.7 kg)   SpO2 97%   BMI 26.47 kg/m    Subjective:    Patient ID: Stanley Dickerson, male    DOB: [birth year], 64 y.o.   MRN: [id number]  HPI: Stanley Dickerson is a 64 y.o. male  Chief Complaint  Patient presents with   Diabetes   Hypertension   Hyperlipidemia   Has been having lower back pain and pain in his groin for a couple of months he notes that his legs feel sore. He was concerned about a UTI.   DIABETES Hypoglycemic episodes:no Polydipsia/polyuria: yes Visual disturbance: no Chest pain: no Paresthesias: no Glucose Monitoring: no  Accucheck frequency: Not Checking Taking Insulin?: no Blood Pressure Monitoring: not checking Retinal Examination: Not up to Date Foot Exam: Up to Date Diabetic Education: Completed Pneumovax: Up to Date Influenza: Up to Date Aspirin: no  HYPERTENSION / HYPERLIPIDEMIA Satisfied with current treatment? yes Duration of hypertension: chronic BP monitoring frequency: not checking BP medication side effects: no Past BP meds: benazepril  Duration of hyperlipidemia: chronic Cholesterol medication side effects: no Cholesterol supplements: none Past cholesterol medications: atorvastatin  Medication compliance: excellent compliance Aspirin: yes Recent stressors: no Recurrent headaches: no Visual changes: no Palpitations: no Dyspnea: no Chest pain: no Lower extremity edema: no Dizzy/lightheaded: no  ???SLEEP APNEA Sleep apnea status: unknown Duration: chronic Satisfied with current treatment?:  no CPAP use:  no Last sleep study:  Treatments attempted:  Wakes feeling refreshed:  no Daytime hypersomnolence:  yes Fatigue:  yes Insomnia:  yes Good sleep hygiene:  yes Difficulty falling asleep:  no Difficulty staying asleep:  yes Snoring bothers bed partner:   no Observed apnea by bed partner: no Obesity:  no Hypertension: yes  Pulmonary hypertension:  no Coronary artery disease:  no  ANXIETY/STRESS Duration: chronic Status:exacerbated Anxious mood: yes  Excessive worrying: no Irritability: no  Sweating: no Nausea: no Palpitations:no Hyperventilation: no Panic attacks: no Agoraphobia: no  Obscessions/compulsions: no Depressed mood: yes    06/24/2024    8:26 AM 06/18/2024    8:39 AM 06/10/2024    8:37 AM 05/20/2024   10:06 AM 05/07/2024    8:43 AM  Depression screen PHQ 2/9  Decreased Interest 0 0 0 1 0  Down, Depressed, Hopeless 0 0 0 0 0  PHQ - 2 Score 0 0 0 1 0   Anhedonia: no Weight changes: no Insomnia: no   Hypersomnia: yes Fatigue/loss of energy: yes Feelings of worthlessness: no Feelings of guilt: no Impaired concentration/indecisiveness: no Suicidal ideations: no  Crying spells: no Recent Stressors/Life Changes: no   Relationship problems: no   Family stress: no     Financial stress: no    Job stress: no    Recent death/loss: no  Relevant past medical, surgical, family and social history reviewed and updated as indicated. Interim medical history since our last visit reviewed. Allergies and medications reviewed and updated.  Review of Systems  Constitutional:  Positive for fatigue. Negative for activity change, appetite change, chills, diaphoresis, fever and unexpected weight change.  HENT: Negative.    Respiratory: Negative.    Cardiovascular: Negative.   Gastrointestinal: Negative.   Musculoskeletal: Negative.   Neurological:  Positive for dizziness and headaches. Negative for tremors, seizures, syncope, facial asymmetry, speech difficulty, weakness, light-headedness and  numbness.  Psychiatric/Behavioral: Negative.      Per HPI unless specifically indicated above     Objective:    BP 133/72 (BP Location: Left Arm, Patient Position: Sitting, Cuff Size: Normal)   Pulse 72   Temp 97.7 F (36.5 C)  (Oral)   Resp 16   Ht 5' 7 (1.702 m)   Wt 169 lb (76.7 kg)   SpO2 97%   BMI 26.47 kg/m   Wt Readings from Last 3 Encounters:  11/19/24 169 lb (76.7 kg)  08/10/24 166 lb 12.8 oz (75.7 kg)  05/27/24 166 lb 9.6 oz (75.6 kg)    Physical Exam Vitals and nursing note reviewed.  Constitutional:      General: He is not in acute distress.    Appearance: Normal appearance. He is normal weight. He is not ill-appearing, toxic-appearing or diaphoretic.  HENT:     Head: Normocephalic and atraumatic.     Right Ear: External ear normal.     Left Ear: External ear normal.     Nose: Nose normal.     Mouth/Throat:     Mouth: Mucous membranes are moist.     Pharynx: Oropharynx is clear.  Eyes:     General: No scleral icterus.       Right eye: No discharge.        Left eye: No discharge.     Extraocular Movements: Extraocular movements intact.     Conjunctiva/sclera: Conjunctivae normal.     Pupils: Pupils are equal, round, and reactive to light.  Cardiovascular:     Rate and Rhythm: Normal rate and regular rhythm.     Pulses: Normal pulses.     Heart sounds: Normal heart sounds. No murmur heard.    No friction rub. No gallop.  Pulmonary:     Effort: Pulmonary effort is normal. No respiratory distress.     Breath sounds: Normal breath sounds. No stridor. No wheezing, rhonchi or rales.  Chest:     Chest wall: No tenderness.  Musculoskeletal:        General: Normal range of motion.     Cervical back: Normal range of motion and neck supple.  Skin:    General: Skin is warm and dry.     Capillary Refill: Capillary refill takes less than 2 seconds.     Coloration: Skin is not jaundiced or pale.     Findings: No bruising, erythema, lesion or rash.  Neurological:     General: No focal deficit present.     Mental Status: He is alert and oriented to person, place, and time. Mental status is at baseline.  Psychiatric:        Mood and Affect: Mood normal.        Behavior: Behavior normal.         Thought Content: Thought content normal.        Judgment: Judgment normal.     Results for orders placed or performed in visit on 08/10/24  Bayer DCA Hb A1c Waived   Collection Time: 08/10/24  8:31 AM  Result Value Ref Range   HB A1C (BAYER DCA - WAIVED) 6.9 (H) 4.8 - 5.6 %      Assessment & Plan:   Problem List Items Addressed This Visit       Cardiovascular and Mediastinum   Hypertension   Under good control on current regimen. Continue current regimen. Continue to monitor. Call with any concerns. Refills given. Labs drawn today.  Relevant Medications   atorvastatin  (LIPITOR) 40 MG tablet (Start on 11/20/2024)   Other Relevant Orders   CBC with Differential/Platelet   Comprehensive metabolic panel with GFR   Diabetes mellitus with peripheral vascular disease (HCC) - Primary   Doing well with A1c of 6.8. Continue current regimen. Continue to monitor. Recheck 3 months. Call with any concerns.       Relevant Medications   atorvastatin  (LIPITOR) 40 MG tablet (Start on 11/20/2024)   Other Relevant Orders   Bayer DCA Hb A1c Waived   CBC with Differential/Platelet   Comprehensive metabolic panel with GFR   Microalbumin, Urine Waived     Other   Hyperlipidemia   Under good control on current regimen. Continue current regimen. Continue to monitor. Call with any concerns. Refills given. Labs drawn today.       Relevant Medications   atorvastatin  (LIPITOR) 40 MG tablet (Start on 11/20/2024)   Other Relevant Orders   CBC with Differential/Platelet   Comprehensive metabolic panel with GFR   Lipid Panel w/o Chol/HDL Ratio   Iron  (Fe) deficiency anemia   Rechecking labs today. Await results. Treat as needed.       Relevant Orders   CBC with Differential/Platelet   Comprehensive metabolic panel with GFR   Ferritin   Iron  Binding Cap (TIBC)([facility])   Other Visit Diagnoses       Acute low back pain without sciatica, unspecified back pain laterality        UA clear. Would like to see ortho. Referral placed today.   Relevant Orders   Urinalysis, Routine w reflex microscopic   Ambulatory referral to Orthopedic Surgery     Morning headache       Concern for OSA- will check sleep study. Await results.   Relevant Medications   escitalopram  (LEXAPRO ) 10 MG tablet   Other Relevant Orders   Ambulatory referral to Sleep Studies     Screening for colon cancer       Referral to GI placed today.   Relevant Orders   Ambulatory referral to Gastroenterology        Follow up plan: Return in about 3 months (around 02/16/2025).      "

## 2024-11-19 NOTE — Assessment & Plan Note (Signed)
 Rechecking labs today. Await results. Treat as needed.

## 2024-11-19 NOTE — Patient Instructions (Signed)
 Referring To Provider Information [facility] [address] [place] [address] [phone]

## 2024-11-19 NOTE — Assessment & Plan Note (Signed)
 Not doing great. Will increase his lexapro  to 10mg  and recheck in 3 months. Call with any concerns.

## 2024-11-20 LAB — COMPREHENSIVE METABOLIC PANEL WITH GFR
ALT: 19 [IU]/L (ref 0–44)
AST: 14 [IU]/L (ref 0–40)
Albumin: 4.6 g/dL (ref 3.9–4.9)
Alkaline Phosphatase: 65 [IU]/L (ref 47–123)
BUN/Creatinine Ratio: 28 — ABNORMAL HIGH (ref 10–24)
BUN: 25 mg/dL (ref 8–27)
Bilirubin Total: 0.4 mg/dL (ref 0.0–1.2)
CO2: 22 mmol/L (ref 20–29)
Calcium: 10.4 mg/dL — ABNORMAL HIGH (ref 8.6–10.2)
Chloride: 100 mmol/L (ref 96–106)
Creatinine, Ser: 0.89 mg/dL (ref 0.76–1.27)
Globulin, Total: 1.9 g/dL (ref 1.5–4.5)
Glucose: 186 mg/dL — ABNORMAL HIGH (ref 70–99)
Potassium: 4.8 mmol/L (ref 3.5–5.2)
Sodium: 135 mmol/L (ref 134–144)
Total Protein: 6.5 g/dL (ref 6.0–8.5)
eGFR: 95 mL/min/{1.73_m2}

## 2024-11-20 LAB — CBC WITH DIFFERENTIAL/PLATELET
Basophils Absolute: 0.1 10*3/uL (ref 0.0–0.2)
Basos: 1 %
EOS (ABSOLUTE): 0.2 10*3/uL (ref 0.0–0.4)
Eos: 3 %
Hematocrit: 34.6 % — ABNORMAL LOW (ref 37.5–51.0)
Hemoglobin: 11 g/dL — ABNORMAL LOW (ref 13.0–17.7)
Immature Grans (Abs): 0 10*3/uL (ref 0.0–0.1)
Immature Granulocytes: 0 %
Lymphocytes Absolute: 1 10*3/uL (ref 0.7–3.1)
Lymphs: 17 %
MCH: 30.8 pg (ref 26.6–33.0)
MCHC: 31.8 g/dL (ref 31.5–35.7)
MCV: 97 fL (ref 79–97)
Monocytes Absolute: 0.4 10*3/uL (ref 0.1–0.9)
Monocytes: 7 %
Neutrophils Absolute: 4.1 10*3/uL (ref 1.4–7.0)
Neutrophils: 71 %
Platelets: 244 10*3/uL (ref 150–450)
RBC: 3.57 x10E6/uL — ABNORMAL LOW (ref 4.14–5.80)
RDW: 15.1 % (ref 11.6–15.4)
WBC: 5.7 10*3/uL (ref 3.4–10.8)

## 2024-11-20 LAB — FERRITIN: Ferritin: 23 ng/mL — ABNORMAL LOW (ref 30–400)

## 2024-11-20 LAB — LIPID PANEL W/O CHOL/HDL RATIO
Cholesterol, Total: 133 mg/dL (ref 100–199)
HDL: 40 mg/dL
LDL Chol Calc (NIH): 61 mg/dL (ref 0–99)
Triglycerides: 192 mg/dL — ABNORMAL HIGH (ref 0–149)
VLDL Cholesterol Cal: 32 mg/dL (ref 5–40)

## 2024-11-20 LAB — IRON AND TIBC
Iron Saturation: 41 % (ref 15–55)
Iron: 133 ug/dL (ref 38–169)
Total Iron Binding Capacity: 327 ug/dL (ref 250–450)
UIBC: 194 ug/dL (ref 111–343)

## 2024-11-24 ENCOUNTER — Ambulatory Visit

## 2024-11-25 ENCOUNTER — Ambulatory Visit

## 2025-02-18 ENCOUNTER — Ambulatory Visit: Admitting: Family Medicine
